# Patient Record
Sex: Female | Born: 1950
Health system: Southern US, Community
[De-identification: ages and names within clinical notes are randomized; demographics above are authoritative.]

## PROBLEM LIST (undated history)

## (undated) DIAGNOSIS — M1990 Unspecified osteoarthritis, unspecified site: Secondary | ICD-10-CM

## (undated) DIAGNOSIS — E215 Disorder of parathyroid gland, unspecified: Secondary | ICD-10-CM

## (undated) DIAGNOSIS — I10 Essential (primary) hypertension: Secondary | ICD-10-CM

## (undated) DIAGNOSIS — E7800 Pure hypercholesterolemia, unspecified: Secondary | ICD-10-CM

## (undated) DIAGNOSIS — S83209A Unspecified tear of unspecified meniscus, current injury, unspecified knee, initial encounter: Secondary | ICD-10-CM

## (undated) DIAGNOSIS — E119 Type 2 diabetes mellitus without complications: Secondary | ICD-10-CM

## (undated) HISTORY — PX: COLONOSCOPY: SHX174

## (undated) HISTORY — PX: CATARACT EXTRACTION: SUR2

---

## 2001-02-09 ENCOUNTER — Encounter: Payer: Self-pay | Admitting: Cardiology

## 2001-02-09 ENCOUNTER — Ambulatory Visit (HOSPITAL_COMMUNITY): Admission: RE | Admit: 2001-02-09 | Discharge: 2001-02-09 | Payer: Self-pay | Admitting: Cardiology

## 2002-01-30 ENCOUNTER — Ambulatory Visit (HOSPITAL_COMMUNITY): Admission: RE | Admit: 2002-01-30 | Discharge: 2002-01-30 | Payer: Self-pay | Admitting: Family Medicine

## 2002-01-30 ENCOUNTER — Encounter: Payer: Self-pay | Admitting: Family Medicine

## 2002-02-06 ENCOUNTER — Ambulatory Visit (HOSPITAL_COMMUNITY): Admission: RE | Admit: 2002-02-06 | Discharge: 2002-02-06 | Payer: Self-pay | Admitting: Family Medicine

## 2002-02-06 ENCOUNTER — Encounter: Payer: Self-pay | Admitting: Family Medicine

## 2003-12-12 ENCOUNTER — Ambulatory Visit (HOSPITAL_COMMUNITY): Admission: RE | Admit: 2003-12-12 | Discharge: 2003-12-12 | Payer: Self-pay | Admitting: Family Medicine

## 2004-12-26 ENCOUNTER — Ambulatory Visit (HOSPITAL_COMMUNITY): Admission: RE | Admit: 2004-12-26 | Discharge: 2004-12-26 | Payer: Self-pay | Admitting: Family Medicine

## 2005-01-19 ENCOUNTER — Ambulatory Visit: Payer: Self-pay | Admitting: Orthopedic Surgery

## 2005-04-22 ENCOUNTER — Ambulatory Visit (HOSPITAL_COMMUNITY): Admission: RE | Admit: 2005-04-22 | Discharge: 2005-04-22 | Payer: Self-pay | Admitting: Family Medicine

## 2005-04-27 ENCOUNTER — Ambulatory Visit: Payer: Self-pay | Admitting: Cardiology

## 2005-04-27 ENCOUNTER — Ambulatory Visit (HOSPITAL_COMMUNITY): Admission: RE | Admit: 2005-04-27 | Discharge: 2005-04-27 | Payer: Self-pay | Admitting: Family Medicine

## 2006-09-07 ENCOUNTER — Ambulatory Visit: Payer: Self-pay | Admitting: Internal Medicine

## 2006-09-24 ENCOUNTER — Ambulatory Visit (HOSPITAL_COMMUNITY): Admission: RE | Admit: 2006-09-24 | Discharge: 2006-09-24 | Payer: Self-pay | Admitting: Internal Medicine

## 2006-09-24 ENCOUNTER — Ambulatory Visit: Payer: Self-pay | Admitting: Internal Medicine

## 2006-12-20 ENCOUNTER — Ambulatory Visit: Payer: Self-pay | Admitting: Internal Medicine

## 2007-10-05 ENCOUNTER — Ambulatory Visit: Payer: Self-pay | Admitting: Internal Medicine

## 2008-04-25 ENCOUNTER — Ambulatory Visit: Payer: Self-pay | Admitting: Gastroenterology

## 2009-06-10 DIAGNOSIS — R7989 Other specified abnormal findings of blood chemistry: Secondary | ICD-10-CM | POA: Insufficient documentation

## 2009-06-10 DIAGNOSIS — Z8719 Personal history of other diseases of the digestive system: Secondary | ICD-10-CM | POA: Insufficient documentation

## 2009-06-10 DIAGNOSIS — K649 Unspecified hemorrhoids: Secondary | ICD-10-CM | POA: Insufficient documentation

## 2009-06-10 DIAGNOSIS — E669 Obesity, unspecified: Secondary | ICD-10-CM | POA: Insufficient documentation

## 2009-06-10 DIAGNOSIS — E119 Type 2 diabetes mellitus without complications: Secondary | ICD-10-CM | POA: Insufficient documentation

## 2009-06-10 DIAGNOSIS — I10 Essential (primary) hypertension: Secondary | ICD-10-CM

## 2009-06-10 DIAGNOSIS — K7689 Other specified diseases of liver: Secondary | ICD-10-CM | POA: Insufficient documentation

## 2009-06-10 DIAGNOSIS — E785 Hyperlipidemia, unspecified: Secondary | ICD-10-CM | POA: Insufficient documentation

## 2010-01-31 ENCOUNTER — Encounter (HOSPITAL_COMMUNITY): Admission: RE | Admit: 2010-01-31 | Discharge: 2010-03-20 | Payer: Self-pay | Admitting: General Surgery

## 2011-03-03 NOTE — Assessment & Plan Note (Signed)
Katelyn [name], Katelyn Katelyn [name]              CHART#:  [id number]   DATE:  10/05/2007                       DOB:  01/02/1951   CHIEF COMPLAINT:  Rectal bleeding.   SUBJECTIVE:  Katelyn Katelyn [name] is a 60 year old female.  She presents after  a six-month history of intermittent rectal bleeding, she has noted a  small to moderate bright red blood with wiping and on the toilet paper.  She has had some occasional cramping and has noticed blood in her  underwear.  She is generally having between two to four bowel movements  a day.  She denies any vaginal bleeding.  She is status post BTL.  She  had a colonoscopy by Katelyn Katelyn [name] on September 23, 2006.  She was found to  have a single prominent anal papilla and otherwise a normal rectum and  colon.  She has a family history of colon cancer, and is due for a  colonoscopy in December 2012.   She also has a history of non-alcoholic steatohepatitis.  She has had  elevated transaminases and an elevated ferritin, which was felt to be an  acute phase reactant, as her levels were not high enough to suggest  hemochromatosis.  She denies any anorexia or pruritus.  Denies any  heartburn or indigestion.  Denies any dysphagia or odynophagia.  Her  weight has remained stable.   CURRENT MEDICATIONS:  See the list from October 05, 2007.   ALLERGIES:  No known drug allergies.   OBJECTIVE:  VITAL SIGNS:  Weight 186 pounds, height 67 inches,  temperature 97.9 degrees, blood pressure 122/90, pulse 92.  GENERAL:  She is a well-developed and well-nourished female, in no acute  distress.  HEENT:  Pupils clear.  Anicteric.  Oropharynx pink and moist without  lesions.  HEART:  A regular rate and rhythm.  Normal S1 and S2.  ABDOMEN:  Protuberant with positive bowel sounds x4.  Nontender, no  palpable mass or hepatosplenomegaly.  No rebound tenderness or guarding.  RECTAL:  She has multiple large external hemorrhoids.  She has good  sphincter tone.  There is no active bleeding  noted.  A small amount of  medium brown stool is obtained from the vault which is Hemoccult  negative.  No internal masses palpated.  EXTREMITIES:  Without clubbing or edema bilaterally.  SKIN:  Warm and dry without any rash or jaundice.   ASSESSMENT/PLAN:  1. Katelyn Katelyn [name] is a 59 year old female with intermittent      hematochezia, which I suspect is due to large external hemorrhoids      found on examination:  It is reassuring that she did have a normal      colonoscopy last year by Katelyn Katelyn [name].  She does have a family history      and is due for a colonoscopy in 2012.  If bleeding persists despite      hemorrhoid treatment, would consider further evaluation.  2. She has non-alcoholic steatohepatitis:  She had recent liver      function tests done through Dr. Loraine Leriche Katelyn [name]'s office.   PLAN:  1. Will follow up on liver function tests.  She needs a fasting      ferritin if this has not already been done.  2. Hemorrhoid literature given for review.  3. AnaMantle HC Forte, one per rectum twice daily, #20,  with no      refills.   FOLLOWUP:  An office visit in six months with Katelyn Katelyn [name], or sooner if  needed.       Katelyn Katelyn [name], N.P.  Electronically Signed     Katelyn Katelyn [name], M.D.  Electronically Signed    KJ/MEDQ  D:  10/05/2007  T:  10/05/2007  Job:  [id number]   cc:   Katelyn Katelyn [name], M.D.

## 2011-03-03 NOTE — Assessment & Plan Note (Signed)
NAME:  Katelyn [name], Katelyn [name]              CHART#:  [id number]   DATE:  04/25/2008                       DOB:  [birth year]   PRIMARY GASTROENTEROLOGIST:  Jonathon Bellows, MD   CHIEF COMPLAINT:  Followup NASH and hemorrhoids.   PROBLEM LIST:  1. Intermittent hematochezia felt to be due to large external      hemorrhoids.  Normal colonoscopy by Dr. Jena Gauss, September 23, 2006      except for slight single prominent anal papilla.  2. Nonalcoholic steatohepatitis.  3. Elevated ferritin felt to be due to acute-phase reactant.  4. Diabetes mellitus.  5. Obesity.  6. Hyperlipidemia.  7. Hypertension and  8. Bilateral tubal ligation.   SUBJECTIVE:  The patient is a 60 year old African American female.  She  has history of NASH.  She tells me she has recently been placed on a  cholesterol medication but cannot recall the name.  She is not actively  attempting to lose weight.  She did exercises very minimally as she  works a significant amount of hours.  Her weight has remained stable.  She denies any abdominal pain.  Denies any anorexia, rectal bleeding, or  melena currently.  Denies any nausea or vomiting.  Denies any fever or  chills.  Denies any history of jaundice.  Denies any fatigue or pruritus  except for she did have a mild reaction to a soap she was using.  She  had a ferritin of 389 on 04/17/2008, and she had normal iron and TIBC.   CURRENT MEDICATIONS:  See the list from 04/25/2008.   ALLERGIES:  No known drug allergies.   OBJECTIVE:  VITAL SIGNS:  Weight 186.5 pounds, height 67 inches,  temperature 98.6, blood pressure 130/90, and pulse 88.  GENERAL:  The patient is an obese [place] American female who is alert,  oriented, pleasant, and cooperative in acute distress.  HEENT:  Sclerae clear.  Nonicteric.  Conjunctivae pink.  Oropharynx pink  and moist without any lesions.  CHEST:  Heart regular rate and rhythm.  Normal S1 and S2.  ABDOMEN:  Protuberant with positive bowel sounds x4.   No bruits  auscultated.  Soft, nontender, nondistended without palpable mass or  hepatosplenomegaly.  No rebound, tenderness, or guarding.  Exam is  limited given the patient's body habitus.  EXTREMITIES:  Without  clubbing or edema.   ASSESSMENT:  The patient is a 60 year old African American female with  nonalcoholic steatohepatitis.   She has history of hemorrhoids.   She has family history of colorectal carcinoma.   PLAN:  1. Colonoscopy in December 2012.  2. Low-fat, low-cholesterol diet.  3. Urged weight loss.  4. I will obtain recent LFTs from Dr. Geanie Logan office for our      review.  5. She was given information on low-fat, low-cholesterol diet as well      as nonalcoholic fatty liver disease.       Lorenza Burton, N.P.  Electronically Signed     Kassie Mends, M.D.  Electronically Signed    KJ/MEDQ  D:  04/25/2008  T:  04/25/2008  Job:  [id number]   cc:   Patrica Duel, M.D.

## 2011-03-06 NOTE — Op Note (Signed)
Katelyn [name], Katelyn [name]             ACCOUNT NO.:  [id number]   MEDICAL RECORD NO.:  [id number]          PATIENT TYPE:  AMB   LOCATION:  DAY                           FACILITY:  [facility]   PHYSICIAN:  R. Roetta Sessions, M.D. DATE OF BIRTH:  1951/02/10   DATE OF PROCEDURE:  09/24/2006  DATE OF DISCHARGE:                               OPERATIVE REPORT   PROCEDURE:  Screening colonoscopy.   INDICATIONS FOR PROCEDURE:  The patient is a 60 year old lady devoid of  any lower GI tract symptoms, who comes for high-risk colorectal cancer  screening.  She is followed primarily Dr. Patrica Duel.   She has a positive family history of colon cancer in both her brother  and father, and she has never been imaged previously.  Colonoscopy is  now being done as a high risk screening maneuver.  This approach has  been discussed with the patient at length.  Potential risks, benefits  and alternatives have been reviewed, questions answered.  She is  agreeable.  Please see documentation in the medical record.   PROCEDURE NOTE:  O2 saturation, blood pressure, pulse and respirations  were monitored throughout the entire procedure.   CONSCIOUS SEDATION:  Versed 3 mg IV, Demerol 75 mg IV in divided doses.   INSTRUMENT USED:  Olympus video chip system.   FINDINGS:  Digital rectal exam revealed no abnormalities.  Endoscopic  findings:  The prep was excellent.   Colon:  Colonic mucosa was surveyed from the rectosigmoid junction  through the left, transverse, right colon, to the area of the appendical  orifice, ileocecal valve and cecum.  These structures were well-seen and  photographed for the record.  From this level the scope was slowly  withdrawn an all previously-mentioned mucosal surfaces were again seen.  The colonic mucosa appeared normal.  The scope was pulled down in the  rectum.  A thorough examination of the rectal mucosa including a  retroflex view of the anal verge revealed a single prominent  anal  papilla.  Otherwise, rectal mucosa appeared normal.  The patient  tolerated the procedure well, was reacted in endoscopy.   IMPRESSION:  1. Single prominent anal papilla, otherwise normal rectum.  2. Normal colon.   RECOMMENDATIONS:  1. Repeat high-risk screening colonoscopy 5 years.  2. Separate issue:  She had she has a fatty-appearing liver on imaging      and transaminitis.  She will be following up with [place] as an      outpatient in the near future.      Jonathon Bellows, M.D.  Electronically Signed     RMR/MEDQ  D:  09/24/2006  T:  09/24/2006  Job:  [id number]   cc:   Patrica Duel, M.D.  Fax: [phone]

## 2011-03-06 NOTE — Procedures (Signed)
Katelyn [name], Katelyn [name]             ACCOUNT NO.:  [id number]   MEDICAL RECORD NO.:  [id number]          PATIENT TYPE:  OUT   LOCATION:  RAD                           FACILITY:  [facility]   PHYSICIAN:  [place] Bing, M.D.  DATE OF BIRTH:  11/03/1950   DATE OF PROCEDURE:  04/27/2005  DATE OF DISCHARGE:                                  ECHOCARDIOGRAM   REFERRING:  Dr. Nobie Putnam   CLINICAL DATA:  A 60 year old woman with hypertension and edema.   M-MODE:  Aorta 2.4, left atrium 3.9, septum 1.5, posterior wall 1, LV  diastole 3.6, LV systole 2.5, RV diastole 3.4.   1.  Technically adequate echocardiographic study.  2.  Normal left atrium, right atrium, and right ventricle.  3.  Normal pulmonic, aortic, mitral, and tricuspid valves.  4.  Normal proximal pulmonary artery.  5.  Normal left ventricular size; borderline hypertrophy with proximal      septal thickening.  Normal regional and global LV systolic function.       RR/MEDQ  D:  04/28/2005  T:  04/28/2005  Job:  [id number]

## 2011-03-06 NOTE — Consult Note (Signed)
NAMEANNIS, LAGOY             ACCOUNT NO.:  [id number]   MEDICAL RECORD NO.:  [id number]          PATIENT TYPE:  AMB   LOCATION:                                FACILITY:  [facility]   PHYSICIAN:  R. Roetta Sessions, M.D. DATE OF BIRTH:  1950/11/05   DATE OF CONSULTATION:  09/07/2006  DATE OF DISCHARGE:                                   CONSULTATION   CHIEF COMPLAINT:  Fatty liver, elevated LFTs.   HISTORY OF PRESENT ILLNESS:  Ms. Mcmanigal is a 60 year old African-American  female who presents for further evaluation of elevated LFTs thought to be  secondary to fatty liver.  She states she first found out that her LFTs were  elevated back in May of this year.  She apparently had a hepatitis panel at  that time which was negative but we do not have these records.  She had  follow-up blood work on August 07, 2006 which revealed a hemoglobin 14,  hematocrit 43, white count 4600, platelets 253,000.  Total bilirubin 0.3,  alkaline phosphatase 74, AST 42, ALT 80, albumin 5, total cholesterol of 269  with LDL of 182, HDL 46, triglycerides 205.  Hemoglobin A1c 7.5.  GGT is  107.  Overall, she feels well.  She feels like her abdomen is bigger than it  used to be but admits to a 10-pound weight gain in the last 6 months due to  decreased activity or exercise.  Generally, she has 3-4 bowel movements a  day since starting Glucophage.  She denies any blood in her stools or  melena.  No nausea, vomiting, heartburn, dysphagia, odynophagia.  She never  had a colonoscopy.   CURRENT MEDICATIONS:  1. Norvasc 10 mg daily.  2. Hydrochlorothiazide 25 mg daily.  3. Glucophage 500 mg b.i.d.  4. Fish oil three tablets daily.   ALLERGIES:  NO KNOWN DRUG ALLERGIES.   PAST MEDICAL HISTORY:  1. Diabetes mellitus, recently started medication.  2. Hypercholesterolemia, recently started fish oil.  3. Hypertension.  4. Bilateral tubal ligation.   FAMILY HISTORY:  Mother died of a stroke at age 59.  Father is  31 years old,  underwent surgery for colon cancer a couple of months ago.   SOCIAL HISTORY:  She is married, has 4 children.  She is employed with [employer] as a Psychologist, sport and exercise.  Never been a smoker.  No alcohol use.   REVIEW OF SYSTEMS:  See HPI for GI.  CARDIOPULMONARY:  No chest pain or shortness of breath.   PHYSICAL EXAMINATION:  Weight 187.5, height 5 feet 7 inches, temperature  98.5, blood pressure is 122/80, pulse 88.  GENERAL:  Pleasant, moderately obese black female in no acute distress.  SKIN:  Warm and dry, no jaundice.  HEENT:  Pupils equal, round, reactive to light.  Sclerae nonicteric.  Oropharyngeal mucosa moist and pink.  No lesions, erythema or exudate.  No  lymphadenopathy, thyromegaly.  CHEST:  Lungs are clear to auscultation.  CARDIAC:  Reveals regular rate and rhythm.  Normal S1 and S2.  No murmurs,  rubs or gallops.  ABDOMEN:  Positive bowel sounds, obese but symmetrical and soft.  Nontender.  Liver edge palpable below the right costal margin, midclavicular line.  Spleen not appreciated.  No masses, hernias or abdominal bruits.  EXTREMITIES:  No edema.   Abdominal ultrasound revealed fatty infiltration of the liver with the liver  measuring 20.3 cm.  Spleen unremarkable.   IMPRESSION:  Ms. Marchetti is a 60 year old lady with hepatomegaly with  fatty infiltration of the liver and mildly elevated transaminases in the  setting of diabetes and hyperlipidemia and obesity.  Suspect we are dealing  with nonalcoholic steatohepatitis.  She reportedly had negative viral  markers previously and we will check those records.  In addition, will check  for hemachromatosis.  She also has a family history of colorectal cancer.  She is 60 years old and has never had a colonoscopy.  She is requesting  scheduling one at this time.  I discussed risks, alternatives and benefits  with regards to risk and reaction of medication, incomplete procedure,  bleeding, infection,  perforation and she is agreeable to proceed.   PLAN:  1. Colonoscopy in the near future.  2. Retrieve viral markers from Dr. Geanie Logan office.  After those are      reviewed, we will order other labs.  3. Encourage dietary modifications, weight loss, exercise.  4. Further recommendations to follow.      Tana Coast, P.AJonathon Bellows, M.D.  Electronically Signed    LL/MEDQ  D:  09/07/2006  T:  09/07/2006  Job:  [id number]   cc:   Patrica Duel, M.D.  Fax: [phone]   Melony Overly, PA

## 2011-07-31 ENCOUNTER — Encounter: Payer: Self-pay | Admitting: Internal Medicine

## 2012-06-17 ENCOUNTER — Other Ambulatory Visit (HOSPITAL_COMMUNITY): Payer: Self-pay | Admitting: Family Medicine

## 2012-06-17 DIAGNOSIS — Z139 Encounter for screening, unspecified: Secondary | ICD-10-CM

## 2012-06-21 ENCOUNTER — Ambulatory Visit (HOSPITAL_COMMUNITY)
Admission: RE | Admit: 2012-06-21 | Discharge: 2012-06-21 | Disposition: A | Discharge status: Home / Self Care | Payer: 59 | Source: Ambulatory Visit | Attending: Family Medicine | Admitting: Family Medicine

## 2012-06-21 DIAGNOSIS — R922 Inconclusive mammogram: Secondary | ICD-10-CM | POA: Insufficient documentation

## 2012-06-21 DIAGNOSIS — Z139 Encounter for screening, unspecified: Secondary | ICD-10-CM

## 2012-06-22 ENCOUNTER — Other Ambulatory Visit: Payer: Self-pay | Admitting: Family Medicine

## 2012-06-22 DIAGNOSIS — R928 Other abnormal and inconclusive findings on diagnostic imaging of breast: Secondary | ICD-10-CM

## 2012-06-29 ENCOUNTER — Ambulatory Visit (HOSPITAL_COMMUNITY)
Admission: RE | Admit: 2012-06-29 | Discharge: 2012-06-29 | Disposition: A | Discharge status: Home / Self Care | Payer: 59 | Source: Ambulatory Visit | Attending: Family Medicine | Admitting: Family Medicine

## 2012-06-29 ENCOUNTER — Other Ambulatory Visit (HOSPITAL_COMMUNITY): Payer: Self-pay | Admitting: Family Medicine

## 2012-06-29 DIAGNOSIS — R928 Other abnormal and inconclusive findings on diagnostic imaging of breast: Secondary | ICD-10-CM

## 2013-01-02 ENCOUNTER — Other Ambulatory Visit (HOSPITAL_COMMUNITY): Payer: Self-pay | Admitting: Family Medicine

## 2013-01-02 ENCOUNTER — Ambulatory Visit (HOSPITAL_COMMUNITY)
Admission: RE | Admit: 2013-01-02 | Discharge: 2013-01-02 | Disposition: A | Discharge status: Home / Self Care | Payer: BC Managed Care – PPO | Source: Ambulatory Visit | Attending: Family Medicine | Admitting: Family Medicine

## 2013-01-02 DIAGNOSIS — M25561 Pain in right knee: Secondary | ICD-10-CM

## 2013-01-02 DIAGNOSIS — M25469 Effusion, unspecified knee: Secondary | ICD-10-CM | POA: Insufficient documentation

## 2013-01-02 DIAGNOSIS — M25569 Pain in unspecified knee: Secondary | ICD-10-CM | POA: Insufficient documentation

## 2013-01-12 ENCOUNTER — Other Ambulatory Visit (HOSPITAL_COMMUNITY): Payer: Self-pay | Admitting: Physician Assistant

## 2013-01-12 DIAGNOSIS — M25561 Pain in right knee: Secondary | ICD-10-CM

## 2013-01-12 DIAGNOSIS — M25469 Effusion, unspecified knee: Secondary | ICD-10-CM

## 2013-01-14 ENCOUNTER — Encounter (HOSPITAL_COMMUNITY): Payer: Self-pay | Admitting: *Deleted

## 2013-01-14 ENCOUNTER — Emergency Department (HOSPITAL_COMMUNITY)
Admission: EM | Admit: 2013-01-14 | Discharge: 2013-01-14 | Disposition: A | Discharge status: Home / Self Care | Payer: BC Managed Care – PPO | Attending: Emergency Medicine | Admitting: Emergency Medicine

## 2013-01-14 DIAGNOSIS — M25561 Pain in right knee: Secondary | ICD-10-CM

## 2013-01-14 DIAGNOSIS — Z79899 Other long term (current) drug therapy: Secondary | ICD-10-CM | POA: Insufficient documentation

## 2013-01-14 DIAGNOSIS — M25569 Pain in unspecified knee: Secondary | ICD-10-CM | POA: Insufficient documentation

## 2013-01-14 DIAGNOSIS — G8911 Acute pain due to trauma: Secondary | ICD-10-CM | POA: Insufficient documentation

## 2013-01-14 DIAGNOSIS — M549 Dorsalgia, unspecified: Secondary | ICD-10-CM | POA: Insufficient documentation

## 2013-01-14 MED ORDER — DEXAMETHASONE 6 MG PO TABS: ORAL_TABLET | ORAL | Status: DC

## 2013-01-14 MED ORDER — DEXAMETHASONE SODIUM PHOSPHATE 4 MG/ML IJ SOLN
8.0000 mg | Freq: Once | INTRAMUSCULAR | Status: AC
  Administered 2013-01-14: 8 mg via INTRAMUSCULAR
  Filled 2013-01-14: qty 2

## 2013-01-14 MED ORDER — HYDROCODONE-ACETAMINOPHEN 7.5-325 MG PO TABS: 1.0000 | ORAL_TABLET | ORAL | Status: DC | PRN

## 2013-01-14 MED ORDER — ONDANSETRON HCL 4 MG PO TABS
4.0000 mg | ORAL_TABLET | Freq: Once | ORAL | Status: AC
  Administered 2013-01-14: 4 mg via ORAL
  Filled 2013-01-14: qty 1

## 2013-01-14 MED ORDER — HYDROCODONE-ACETAMINOPHEN 5-325 MG PO TABS
2.0000 | ORAL_TABLET | Freq: Once | ORAL | Status: AC
  Administered 2013-01-14: 2 via ORAL
  Filled 2013-01-14: qty 2

## 2013-01-14 NOTE — ED Notes (Signed)
Pt states that she hit her right knee on the end of the table two weeks ago, c/o pain and swelling noted to right knee area. Was seen at [facility], had xrays performed, is scheduled for mri on Monday.

## 2013-01-14 NOTE — ED Provider Notes (Signed)
History     CSN: [id number]  Arrival date & time 01/14/13  1225   First MD Initiated Contact with Patient 01/14/13 1317      Chief Complaint  Patient presents with  . Knee Pain    (Consider location/radiation/quality/duration/timing/severity/associated sxs/prior treatment) Patient is a 62 y.o. female presenting with knee pain. The history is provided by the patient.  Knee Pain Location:  Knee Time since incident:  2 weeks Injury: no   Knee location:  R knee Pain details:    Quality:  Aching and throbbing   Severity:  Severe   Onset quality:  Gradual   Timing:  Intermittent   Progression:  Worsening Chronicity:  New Dislocation: no   Prior injury to area:  No Relieved by:  Nothing Worsened by:  Extension, flexion and bearing weight Ineffective treatments:  NSAIDs Associated symptoms: back pain, decreased ROM and swelling   Associated symptoms: no neck pain     History reviewed. No pertinent past medical history.  History reviewed. No pertinent past surgical history.  No family history on file.  History  Substance Use Topics  . Smoking status: Never Smoker   . Smokeless tobacco: Not on file  . Alcohol Use: No    OB History   Grav Para Term Preterm Abortions TAB SAB Ect Mult Living                  Review of Systems  Constitutional: Negative for activity change.       All ROS Neg except as noted in HPI  HENT: Negative for nosebleeds and neck pain.   Eyes: Negative for photophobia and discharge.  Respiratory: Negative for cough, shortness of breath and wheezing.   Cardiovascular: Negative for chest pain and palpitations.  Gastrointestinal: Negative for abdominal pain and blood in stool.  Genitourinary: Negative for dysuria, frequency and hematuria.  Musculoskeletal: Positive for back pain, joint swelling, arthralgias and gait problem.  Skin: Negative.   Neurological: Negative for dizziness, seizures and speech difficulty.  Psychiatric/Behavioral:  Negative for hallucinations and confusion.    Allergies  Review of patient's allergies indicates no known allergies.  Home Medications   Current Outpatient Rx  Name  Route  Sig  Dispense  Refill  . amLODipine (NORVASC) 10 MG tablet   Oral   Take 10 mg by mouth daily.         . Aspirin-Caffeine (BAYER BACK & BODY PAIN EX ST) 500-32.5 MG TABS   Oral   Take 1 tablet by mouth 2 (two) times daily as needed (pain).         . hydrochlorothiazide (HYDRODIURIL) 25 MG tablet   Oral   Take 25 mg by mouth daily.         Marland Kitchen ibuprofen (ADVIL,MOTRIN) 200 MG tablet   Oral   Take 800 mg by mouth every 6 (six) hours as needed for pain.         Marland Kitchen lisinopril (PRINIVIL,ZESTRIL) 20 MG tablet   Oral   Take 20 mg by mouth daily.         . metFORMIN (GLUCOPHAGE) 500 MG tablet   Oral   Take 500 mg by mouth daily.         . OMEGA 3 1000 MG CAPS   Oral   Take 1 capsule by mouth daily.           BP 156/93  Pulse 93  Temp(Src) 98.1 F (36.7 C) (Oral)  Resp 20  Ht 5\' 7"  (  1.702 m)  Wt 186 lb (84.369 kg)  BMI 29.12 kg/m2  SpO2 96%  Physical Exam  Nursing note and vitals reviewed. Constitutional: She is oriented to person, place, and time. She appears well-developed and well-nourished.  Non-toxic appearance.  HENT:  Head: Normocephalic.  Right Ear: Tympanic membrane and external ear normal.  Left Ear: Tympanic membrane and external ear normal.  Eyes: EOM and lids are normal. Pupils are equal, round, and reactive to light.  Neck: Normal range of motion. Neck supple. Carotid bruit is not present.  Cardiovascular: Normal rate, regular rhythm, normal heart sounds, intact distal pulses and normal pulses.   Pulmonary/Chest: Breath sounds normal. No respiratory distress.  Abdominal: Soft. Bowel sounds are normal. There is no tenderness. There is no guarding.  Musculoskeletal: Normal range of motion.  There is soreness with range of motion of the right hip. The right knee has a  mild-to-moderate effusion present. Decreased range of motion. The knee is warm but not hot. The patient cannot cooperate for full examination of the posterior knee. The anterior tibial tuberosity is within normal limits. Dorsalis pedis pulses are 2+ bilaterally.  Lymphadenopathy:       Head (right side): No submandibular adenopathy present.       Head (left side): No submandibular adenopathy present.    She has no cervical adenopathy.  Neurological: She is alert and oriented to person, place, and time. She has normal strength. No cranial nerve deficit or sensory deficit.  Skin: Skin is warm and dry.  Psychiatric: She has a normal mood and affect. Her speech is normal.    ED Course  Procedures (including critical care time)  Labs Reviewed - No data to display No results found.   No diagnosis found.    MDM  I have reviewed nursing notes, vital signs, and all appropriate lab and imaging results for this patient. Patient states she's been having problems with her knees as well as her back. She sustained an injury to the knee approximately 2 to have weeks ago she had an x-ray that was essentially negative. She continued to have problems with the knee and went to see her physician at [facility] and has an MRI ordered for Monday, March 31. The patient's attempted to work today but had to leave because of the pain and discomfort.  The patient is noted to have an effusion with a warm knee present. The vital signs are within normal limits with exception of the blood pressure being 156/93. I suspect the patient is having an inflammatory attack. But feels this is an acute on chronic knee problem. The patient is treated with Decadron and Norco. Patient is to see her primary physician on Monday for recheck.     and  Kathie Dike, PA-C 01/14/13 1341

## 2013-01-15 NOTE — ED Provider Notes (Signed)
Medical screening examination/treatment/procedure(s) were performed by non-physician practitioner and as supervising physician I was immediately available for consultation/collaboration.  Savas Elvin, MD 01/15/13 1507 

## 2013-01-16 ENCOUNTER — Ambulatory Visit (HOSPITAL_COMMUNITY)
Admission: RE | Admit: 2013-01-16 | Discharge: 2013-01-16 | Disposition: A | Discharge status: Home / Self Care | Payer: BC Managed Care – PPO | Source: Ambulatory Visit | Attending: Physician Assistant | Admitting: Physician Assistant

## 2013-01-16 DIAGNOSIS — M25569 Pain in unspecified knee: Secondary | ICD-10-CM | POA: Insufficient documentation

## 2013-01-16 DIAGNOSIS — M898X9 Other specified disorders of bone, unspecified site: Secondary | ICD-10-CM | POA: Insufficient documentation

## 2013-01-16 DIAGNOSIS — M2240 Chondromalacia patellae, unspecified knee: Secondary | ICD-10-CM | POA: Insufficient documentation

## 2013-01-16 DIAGNOSIS — M25561 Pain in right knee: Secondary | ICD-10-CM

## 2013-01-16 DIAGNOSIS — M25469 Effusion, unspecified knee: Secondary | ICD-10-CM | POA: Insufficient documentation

## 2013-01-16 DIAGNOSIS — M23349 Other meniscus derangements, anterior horn of lateral meniscus, unspecified knee: Secondary | ICD-10-CM | POA: Insufficient documentation

## 2013-01-30 ENCOUNTER — Other Ambulatory Visit (HOSPITAL_COMMUNITY): Payer: Self-pay | Admitting: Orthopedic Surgery

## 2013-01-30 ENCOUNTER — Ambulatory Visit (HOSPITAL_COMMUNITY)
Admission: RE | Admit: 2013-01-30 | Discharge: 2013-01-30 | Disposition: A | Discharge status: Home / Self Care | Payer: BC Managed Care – PPO | Source: Ambulatory Visit | Attending: Orthopedic Surgery | Admitting: Orthopedic Surgery

## 2013-01-30 DIAGNOSIS — R0989 Other specified symptoms and signs involving the circulatory and respiratory systems: Secondary | ICD-10-CM

## 2013-01-30 NOTE — Progress Notes (Signed)
VASCULAR LAB PRELIMINARY  ARTERIAL  ABI completed:    RIGHT    LEFT    PRESSURE WAVEFORM  PRESSURE WAVEFORM  BRACHIAL 143 Triphasic BRACHIAL 149 Triphasic  DP 142 Triphasic DP 119 Biphasic  AT   AT    PT 142 Triphasic PT 131 Biphasic  PER   PER    GREAT TOE  NA GREAT TOE  NA    RIGHT LEFT  ABI 0.95 0.88   The right ABI is within normal limits. The left ABI is suggestive of mild arterial insufficiency.  01/30/2013 2:51 PM Gertie Fey, RDMS, RDCS

## 2013-02-08 ENCOUNTER — Other Ambulatory Visit: Payer: Self-pay | Admitting: Orthopedic Surgery

## 2013-02-14 ENCOUNTER — Encounter (HOSPITAL_BASED_OUTPATIENT_CLINIC_OR_DEPARTMENT_OTHER): Payer: Self-pay | Admitting: *Deleted

## 2013-02-14 NOTE — Progress Notes (Signed)
NPO AFTER MN WITH EXCEPTION CLEAR LIQUIDS UNTIL 0800 (NO CREAM/ MILK PRODUCTS). ARRIVES AT 1215. NEEDS ISTAT AND EKG. WILL TAKE NORVASC AM OF SURG W/ SIP OF WATER

## 2013-02-16 ENCOUNTER — Encounter (HOSPITAL_BASED_OUTPATIENT_CLINIC_OR_DEPARTMENT_OTHER): Payer: Self-pay | Admitting: Anesthesiology

## 2013-02-16 ENCOUNTER — Encounter (HOSPITAL_BASED_OUTPATIENT_CLINIC_OR_DEPARTMENT_OTHER): Payer: Self-pay | Admitting: *Deleted

## 2013-02-16 ENCOUNTER — Ambulatory Visit (HOSPITAL_BASED_OUTPATIENT_CLINIC_OR_DEPARTMENT_OTHER)
Admission: RE | Admit: 2013-02-16 | Discharge: 2013-02-16 | Disposition: A | Discharge status: Home / Self Care | Payer: BC Managed Care – PPO | Source: Ambulatory Visit | Attending: Orthopedic Surgery | Admitting: Orthopedic Surgery

## 2013-02-16 ENCOUNTER — Encounter (HOSPITAL_BASED_OUTPATIENT_CLINIC_OR_DEPARTMENT_OTHER)
Admission: RE | Disposition: A | Discharge status: Home / Self Care | Payer: Self-pay | Source: Ambulatory Visit | Attending: Orthopedic Surgery

## 2013-02-16 ENCOUNTER — Ambulatory Visit (HOSPITAL_BASED_OUTPATIENT_CLINIC_OR_DEPARTMENT_OTHER): Payer: BC Managed Care – PPO | Admitting: Anesthesiology

## 2013-02-16 DIAGNOSIS — X58XXXA Exposure to other specified factors, initial encounter: Secondary | ICD-10-CM | POA: Insufficient documentation

## 2013-02-16 DIAGNOSIS — E119 Type 2 diabetes mellitus without complications: Secondary | ICD-10-CM | POA: Insufficient documentation

## 2013-02-16 DIAGNOSIS — Z9889 Other specified postprocedural states: Secondary | ICD-10-CM

## 2013-02-16 DIAGNOSIS — E669 Obesity, unspecified: Secondary | ICD-10-CM | POA: Insufficient documentation

## 2013-02-16 DIAGNOSIS — I10 Essential (primary) hypertension: Secondary | ICD-10-CM | POA: Insufficient documentation

## 2013-02-16 DIAGNOSIS — M6750 Plica syndrome, unspecified knee: Secondary | ICD-10-CM | POA: Insufficient documentation

## 2013-02-16 DIAGNOSIS — Z79899 Other long term (current) drug therapy: Secondary | ICD-10-CM | POA: Insufficient documentation

## 2013-02-16 DIAGNOSIS — M1710 Unilateral primary osteoarthritis, unspecified knee: Secondary | ICD-10-CM | POA: Insufficient documentation

## 2013-02-16 DIAGNOSIS — S83289A Other tear of lateral meniscus, current injury, unspecified knee, initial encounter: Secondary | ICD-10-CM | POA: Insufficient documentation

## 2013-02-16 HISTORY — DX: Essential (primary) hypertension: I10

## 2013-02-16 HISTORY — DX: Type 2 diabetes mellitus without complications: E11.9

## 2013-02-16 HISTORY — DX: Unspecified tear of unspecified meniscus, current injury, unspecified knee, initial encounter: S83.209A

## 2013-02-16 HISTORY — PX: KNEE ARTHROSCOPY WITH LATERAL MENISECTOMY: SHX6193

## 2013-02-16 HISTORY — DX: Unspecified osteoarthritis, unspecified site: M19.90

## 2013-02-16 LAB — POCT I-STAT 4, (NA,K, GLUC, HGB,HCT)
Glucose, Bld: 107 mg/dL — ABNORMAL HIGH (ref 70–99)
Potassium: 4.1 mEq/L (ref 3.5–5.1)
Sodium: 140 mEq/L (ref 135–145)

## 2013-02-16 SURGERY — ARTHROSCOPY, KNEE, WITH LATERAL MENISCECTOMY
Anesthesia: General | Site: Knee | Laterality: Right | Wound class: Clean

## 2013-02-16 MED ORDER — HYDROMORPHONE HCL PF 1 MG/ML IJ SOLN
0.2500 mg | INTRAMUSCULAR | Status: DC | PRN
  Administered 2013-02-16 (×2): 0.25 mg via INTRAVENOUS
  Filled 2013-02-16: qty 1

## 2013-02-16 MED ORDER — DEXAMETHASONE SODIUM PHOSPHATE 4 MG/ML IJ SOLN
INTRAMUSCULAR | Status: DC | PRN
  Administered 2013-02-16: 8 mg via INTRAVENOUS

## 2013-02-16 MED ORDER — SODIUM CHLORIDE 0.9 % IR SOLN
Status: DC | PRN
  Administered 2013-02-16: 6000 mL

## 2013-02-16 MED ORDER — HYDROCODONE-ACETAMINOPHEN 10-325 MG PO TABS: 1.0000 | ORAL_TABLET | Freq: Four times a day (QID) | ORAL | Status: DC | PRN

## 2013-02-16 MED ORDER — ONDANSETRON HCL 4 MG/2ML IJ SOLN
INTRAMUSCULAR | Status: DC | PRN
  Administered 2013-02-16: 4 mg via INTRAVENOUS

## 2013-02-16 MED ORDER — MEPERIDINE HCL 25 MG/ML IJ SOLN
6.2500 mg | INTRAMUSCULAR | Status: DC | PRN
  Filled 2013-02-16: qty 1

## 2013-02-16 MED ORDER — FENTANYL CITRATE 0.05 MG/ML IJ SOLN
INTRAMUSCULAR | Status: DC | PRN
  Administered 2013-02-16 (×3): 50 ug via INTRAVENOUS
  Administered 2013-02-16 (×3): 25 ug via INTRAVENOUS
  Administered 2013-02-16 (×2): 50 ug via INTRAVENOUS
  Administered 2013-02-16: 25 ug via INTRAVENOUS
  Administered 2013-02-16: 50 ug via INTRAVENOUS

## 2013-02-16 MED ORDER — BUPIVACAINE-EPINEPHRINE 0.5% -1:200000 IJ SOLN
INTRAMUSCULAR | Status: DC | PRN
  Administered 2013-02-16: 20 mL

## 2013-02-16 MED ORDER — OXYCODONE HCL 5 MG/5ML PO SOLN
5.0000 mg | Freq: Once | ORAL | Status: DC | PRN
Start: 2013-02-16 — End: 2013-02-17
  Filled 2013-02-16: qty 5

## 2013-02-16 MED ORDER — KETOROLAC TROMETHAMINE 30 MG/ML IJ SOLN
INTRAMUSCULAR | Status: DC | PRN
  Administered 2013-02-16: 30 mg via INTRAVENOUS

## 2013-02-16 MED ORDER — SODIUM CHLORIDE 0.9 % IR SOLN
Status: DC | PRN
  Administered 2013-02-16: 15:00:00

## 2013-02-16 MED ORDER — LIDOCAINE HCL (CARDIAC) 20 MG/ML IV SOLN
INTRAVENOUS | Status: DC | PRN
  Administered 2013-02-16: 60 mg via INTRAVENOUS

## 2013-02-16 MED ORDER — ACETAMINOPHEN 10 MG/ML IV SOLN
1000.0000 mg | Freq: Once | INTRAVENOUS | Status: DC | PRN
  Filled 2013-02-16: qty 100

## 2013-02-16 MED ORDER — LACTATED RINGERS IV SOLN
INTRAVENOUS | Status: DC
  Administered 2013-02-16: 16:00:00 via INTRAVENOUS
  Administered 2013-02-16: 100 mL/h via INTRAVENOUS
  Filled 2013-02-16: qty 1000

## 2013-02-16 MED ORDER — MORPHINE SULFATE 4 MG/ML IJ SOLN
INTRAMUSCULAR | Status: DC | PRN
  Administered 2013-02-16: 4 mg via INTRAVENOUS

## 2013-02-16 MED ORDER — PROPOFOL 10 MG/ML IV BOLUS
INTRAVENOUS | Status: DC | PRN
  Administered 2013-02-16: 150 mg via INTRAVENOUS

## 2013-02-16 MED ORDER — OXYCODONE HCL 5 MG PO TABS
5.0000 mg | ORAL_TABLET | Freq: Once | ORAL | Status: DC | PRN
  Filled 2013-02-16: qty 1

## 2013-02-16 MED ORDER — PROMETHAZINE HCL 25 MG/ML IJ SOLN
6.2500 mg | INTRAMUSCULAR | Status: DC | PRN
  Filled 2013-02-16: qty 1

## 2013-02-16 MED ORDER — ACETAMINOPHEN 10 MG/ML IV SOLN
INTRAVENOUS | Status: DC | PRN
  Administered 2013-02-16: 1000 mg via INTRAVENOUS

## 2013-02-16 MED ORDER — POVIDONE-IODINE 7.5 % EX SOLN
Freq: Once | CUTANEOUS | Status: DC
  Filled 2013-02-16: qty 118

## 2013-02-16 SURGICAL SUPPLY — 49 items
BANDAGE ELASTIC 6 VELCRO ST LF (GAUZE/BANDAGES/DRESSINGS) ×3 IMPLANT
BANDAGE ESMARK 6X9 LF (GAUZE/BANDAGES/DRESSINGS) ×1 IMPLANT
BANDAGE GAUZE ELAST BULKY 4 IN (GAUZE/BANDAGES/DRESSINGS) ×2 IMPLANT
BLADE 4.2CUDA (BLADE) IMPLANT
BLADE CUDA 5.5 (BLADE) IMPLANT
BLADE CUDA SHAVER 3.5 (BLADE) ×2 IMPLANT
BLADE CUTTER GATOR 3.5 (BLADE) ×1 IMPLANT
BLADE GREAT WHITE 4.2 (BLADE) IMPLANT
BNDG CMPR 9X6 STRL LF SNTH (GAUZE/BANDAGES/DRESSINGS) ×1
BNDG ESMARK 6X9 LF (GAUZE/BANDAGES/DRESSINGS) ×2
CANISTER SUCT LVC 12 LTR MEDI- (MISCELLANEOUS) ×4 IMPLANT
CANISTER SUCTION 1200CC (MISCELLANEOUS) ×2 IMPLANT
CLOTH BEACON ORANGE TIMEOUT ST (SAFETY) ×2 IMPLANT
DRAPE ARTHROSCOPY W/POUCH 114 (DRAPES) ×2 IMPLANT
DRAPE LG THREE QUARTER DISP (DRAPES) ×2 IMPLANT
DRSG EMULSION OIL 3X3 NADH (GAUZE/BANDAGES/DRESSINGS) ×2 IMPLANT
DRSG PAD ABDOMINAL 8X10 ST (GAUZE/BANDAGES/DRESSINGS) ×1 IMPLANT
DURAPREP 26ML APPLICATOR (WOUND CARE) ×2 IMPLANT
ELECT MENISCUS 165MM 90D (ELECTRODE) IMPLANT
ELECT REM PT RETURN 9FT ADLT (ELECTROSURGICAL)
ELECTRODE REM PT RTRN 9FT ADLT (ELECTROSURGICAL) IMPLANT
GLOVE BIOGEL PI IND STRL 8 (GLOVE) ×1 IMPLANT
GLOVE BIOGEL PI INDICATOR 8 (GLOVE) ×1
GLOVE ECLIPSE 8.0 STRL XLNG CF (GLOVE) ×4 IMPLANT
GLOVE INDICATOR 8.0 STRL GRN (GLOVE) ×4 IMPLANT
GOWN PREVENTION PLUS LG XLONG (DISPOSABLE) ×3 IMPLANT
GOWN STRL REIN XL XLG (GOWN DISPOSABLE) ×2 IMPLANT
IV NS IRRIG 3000ML ARTHROMATIC (IV SOLUTION) ×4 IMPLANT
KNEE WRAP E Z 3 GEL PACK (MISCELLANEOUS) ×2 IMPLANT
NDL HYPO 18GX1.5 BLUNT FILL (NEEDLE) ×1 IMPLANT
NDL SAFETY ECLIPSE 18X1.5 (NEEDLE) ×1 IMPLANT
NEEDLE HYPO 18GX1.5 BLUNT FILL (NEEDLE) ×2 IMPLANT
NEEDLE HYPO 18GX1.5 SHARP (NEEDLE) ×2
PACK ARTHROSCOPY DSU (CUSTOM PROCEDURE TRAY) ×2 IMPLANT
PACK BASIN DAY SURGERY FS (CUSTOM PROCEDURE TRAY) ×2 IMPLANT
PADDING CAST ABS 4INX4YD NS (CAST SUPPLIES) ×1
PADDING CAST ABS COTTON 4X4 ST (CAST SUPPLIES) ×1 IMPLANT
PENCIL BUTTON HOLSTER BLD 10FT (ELECTRODE) IMPLANT
SET ARTHROSCOPY TUBING (MISCELLANEOUS) ×2
SET ARTHROSCOPY TUBING LN (MISCELLANEOUS) ×1 IMPLANT
SPONGE GAUZE 4X4 12PLY (GAUZE/BANDAGES/DRESSINGS) ×2 IMPLANT
SUT ETHIBOND 2 OS 4 DA (SUTURE) IMPLANT
SUT ETHILON 4 0 PS 2 18 (SUTURE) ×2 IMPLANT
SUT VIC AB 0 CT1 36 (SUTURE) IMPLANT
SUT VIC AB 2-0 PS2 27 (SUTURE) IMPLANT
SYRINGE 10CC LL (SYRINGE) ×2 IMPLANT
TOWEL OR 17X24 6PK STRL BLUE (TOWEL DISPOSABLE) ×2 IMPLANT
WAND 90 DEG TURBOVAC W/CORD (SURGICAL WAND) IMPLANT
WATER STERILE IRR 500ML POUR (IV SOLUTION) ×2 IMPLANT

## 2013-02-16 NOTE — Anesthesia Preprocedure Evaluation (Addendum)
Anesthesia Evaluation  Patient identified by MRN, date of birth, ID band Patient awake    Reviewed: Allergy & Precautions, H&P , NPO status , Patient's Chart, lab work & pertinent test results  Airway Mallampati: II      Dental  (+) Dental Advisory Given, Poor Dentition and Loose,    Pulmonary neg pulmonary ROS,  breath sounds clear to auscultation        Cardiovascular hypertension, Pt. on medications Rhythm:Regular Rate:Normal     Neuro/Psych negative neurological ROS  negative psych ROS   GI/Hepatic negative GI ROS, Neg liver ROS,   Endo/Other  diabetes, Type 2, Oral Hypoglycemic Agents  Renal/GU negative Renal ROS     Musculoskeletal negative musculoskeletal ROS (+)   Abdominal (+) + obese,   Peds  Hematology negative hematology ROS (+)   Anesthesia Other Findings   Reproductive/Obstetrics negative OB ROS                          Anesthesia Physical Anesthesia Plan  ASA: III  Anesthesia Plan: General   Post-op Pain Management:    Induction: Intravenous  Airway Management Planned: LMA  Additional Equipment:   Intra-op Plan:   Post-operative Plan: Extubation in OR  Informed Consent: I have reviewed the patients History and Physical, chart, labs and discussed the procedure including the risks, benefits and alternatives for the proposed anesthesia with the patient or authorized representative who has indicated his/her understanding and acceptance.   Dental advisory given  Plan Discussed with: CRNA  Anesthesia Plan Comments:        Anesthesia Quick Evaluation

## 2013-02-16 NOTE — Transfer of Care (Signed)
Immediate Anesthesia Transfer of Care Note  Patient: Katelyn [name]  Procedure(s) Performed: Procedure(s) (LRB): RIGHT KNEE  ARTHROSCOPY WITH PARTIAL LATERAL MENISECTOMY; Excision of plica with shaving of patella,  partial medial menisectomy (Right)  Patient Location: PACU  Anesthesia Type: General  Level of Consciousness: awake, alert  and oriented  Airway & Oxygen Therapy: Patient Spontanous Breathing and Patient connected to face mask oxygen  Post-op Assessment: Report given to PACU RN and Post -op Vital signs reviewed and stable  Post vital signs: Reviewed and stable  Complications: No apparent anesthesia complications

## 2013-02-16 NOTE — H&P (Signed)
Katelyn [name] is an 62 y.o. female.   Chief Complaint: painful rt knee HPI: MRI demonstrates a torn lateral meniscus  Past Medical History  Diagnosis Date  . Acute meniscal tear of knee     RIGHT  . Hypertension   . Diabetes mellitus, type 2   . Arthritis     BACK    History reviewed. No pertinent past surgical history.  History reviewed. No pertinent family history. Social History:  reports that she has never smoked. She has never used smokeless tobacco. She reports that she does not drink alcohol or use illicit drugs.  Allergies: No Known Allergies  Medications Prior to Admission  Medication Sig Dispense Refill  . amLODipine (NORVASC) 10 MG tablet Take 10 mg by mouth every morning.       . Aspirin-Caffeine (BAYER BACK & BODY PAIN EX ST) 500-32.5 MG TABS Take 1 tablet by mouth 2 (two) times daily as needed (pain).      . hydrochlorothiazide (HYDRODIURIL) 25 MG tablet Take 25 mg by mouth daily.      Marland Kitchen ibuprofen (ADVIL,MOTRIN) 200 MG tablet Take 800 mg by mouth every 6 (six) hours as needed for pain.      Marland Kitchen lisinopril (PRINIVIL,ZESTRIL) 20 MG tablet Take 20 mg by mouth daily.      . metFORMIN (GLUCOPHAGE) 500 MG tablet Take 500 mg by mouth daily.      . OMEGA 3 1000 MG CAPS Take 1 capsule by mouth daily.        Results for orders placed during the hospital encounter of 02/16/13 (from the past 48 hour(s))  POCT I-STAT 4, (NA,K, GLUC, HGB,HCT)     Status: Abnormal   Collection Time    02/16/13 12:52 PM      Result Value Range   Sodium 140  135 - 145 mEq/L   Potassium 4.1  3.5 - 5.1 mEq/L   Glucose, Bld 107 (*) 70 - 99 mg/dL   HCT 16.1  09.6 - 04.5 %   Hemoglobin 14.6  12.0 - 15.0 g/dL   No results found.  ROS  Blood pressure 147/92, pulse 90, temperature 97.1 F (36.2 C), temperature source Oral, resp. rate 18, height 5\' 7"  (1.702 m), weight 85.503 kg (188 lb 8 oz), SpO2 99.00%. Physical Exam  Constitutional: She is oriented to person, place, and time. She appears  well-developed and well-nourished.  HENT:  Head: Normocephalic and atraumatic.  Right Ear: External ear normal.  Left Ear: External ear normal.  Nose: Nose normal.  Mouth/Throat: Oropharynx is clear and moist.  Eyes: Conjunctivae and EOM are normal. Pupils are equal, round, and reactive to light.  Neck: Normal range of motion. Neck supple.  Cardiovascular: Normal rate, regular rhythm, normal heart sounds and intact distal pulses.   Respiratory: Effort normal and breath sounds normal.  GI: Soft. Bowel sounds are normal.  Musculoskeletal: Normal range of motion. She exhibits tenderness.  Tender lateral joint line rt knee  Neurological: She is alert and oriented to person, place, and time. She has normal reflexes.  Skin: Skin is warm and dry.  Psychiatric: She has a normal mood and affect. Her behavior is normal. Judgment and thought content normal.     Assessment/Plan Torn lateral meniscus rt knee Rt knee arthroscopy with partial lateral meniscectomy  APLINGTON,JAMES P 02/16/2013, 2:23 PM

## 2013-02-16 NOTE — Anesthesia Procedure Notes (Signed)
Procedure Name: LMA Insertion Date/Time: 02/16/2013 2:43 PM Performed by: Norva Pavlov Pre-anesthesia Checklist: Patient identified, Emergency Drugs available, Suction available and Patient being monitored Patient Re-evaluated:Patient Re-evaluated prior to inductionOxygen Delivery Method: Circle System Utilized Preoxygenation: Pre-oxygenation with 100% oxygen Intubation Type: IV induction Ventilation: Mask ventilation without difficulty LMA: LMA inserted LMA Size: 4.0 Number of attempts: 1 Airway Equipment and Method: bite block Placement Confirmation: positive ETCO2 Tube secured with: Tape Dental Injury: Teeth and Oropharynx as per pre-operative assessment

## 2013-02-16 NOTE — Anesthesia Postprocedure Evaluation (Signed)
Anesthesia Post Note  Patient: Katelyn [name]  Procedure(s) Performed: Procedure(s) (LRB): RIGHT KNEE  ARTHROSCOPY WITH PARTIAL LATERAL MENISECTOMY; Excision of plica with shaving of patella,  partial medial menisectomy (Right)  Anesthesia type: General  Patient location: PACU  Post pain: Pain level controlled  Post assessment: Post-op Vital signs reviewed  Last Vitals: BP 100/64  Pulse 96  Temp(Src) 37.1 C (Oral)  Resp 15  Ht 5\' 7"  (1.702 m)  Wt 188 lb 8 oz (85.503 kg)  BMI 29.52 kg/m2  SpO2 99%  Post vital signs: Reviewed  Level of consciousness: sedated  Complications: No apparent anesthesia complications

## 2013-02-17 ENCOUNTER — Encounter (HOSPITAL_BASED_OUTPATIENT_CLINIC_OR_DEPARTMENT_OTHER): Payer: Self-pay | Admitting: Orthopedic Surgery

## 2013-02-17 NOTE — Op Note (Signed)
NAME:  Katelyn [name], Katelyn [name]             ACCOUNT NO.:  [id number]  MEDICAL RECORD NO.:  [id number]  LOCATION:  VASC                         FACILITY:  [facility]  PHYSICIAN:  Jatia Musa, M.D.  DATE OF BIRTH:  08/07/1951  DATE OF PROCEDURE:  02/16/2013 DATE OF DISCHARGE:  01/30/2013                              OPERATIVE REPORT   PREOPERATIVE DIAGNOSES: 1. Torn lateral meniscus. 2. Osteoarthritis, right knee.  POSTOPERATIVE DIAGNOSES: 1. Torn medial and lateral menisci. 2. Osteoarthritis. 3. Medial and lateral shelf plica.  OPERATION: 1. Right knee arthroscopy with one partial medial lateral     meniscectomies. 2. Shaving of patella 3. Excision of medial and lateral shelf plica.  SURGEON:  Yerania Chamorro, M.D.  ASSISTANT:  Nurse.  ANESTHESIA:  General.  JUSTIFICATION FOR PROCEDURE:  Painful right knee with an MRI demonstrating the preoperative diagnoses.  Remainder of the pathology will be discussed below.  PROCEDURE IN DETAIL:  Satisfactory general anesthesia.  Ace wrap, and knee support to left lower extremity.  Pneumatic tourniquet applied to right lower extremity with a leg Esmarch done nonsterilely and tourniquet inflated to 300 mmHg.  Thigh stabilizer was applied and right leg was then prepped with DuraPrep from stabilizer to ankle and draped in the sterile field.  Time-out performed.  Superior medial saline inflow.  First, an anterolateral portal, medial compartment joint was evaluated.  She had a good bit of synovitis in the anterior medial joint and after resection of 3.5 shaver, it was demonstrated that the synovium was stuck down on the anterior third of the medial meniscus.  On probing the meniscus, she had a bucket-handle type tear, I corrected this by excising the bucket-handle portion with combination of arthroscopic scissors and 3.5 shaver with the remnant stable on probing.  Looking posteriorly with remainder, the meniscus appeared to be intact.   Looking at the medial gutter and suprapatellar area, she demonstrated plicas in both the medial and lateral suprapatellar areas, which I sectioned with scissors and resected with 3.5 shaver.  She also had osteoarthritis of the patella surface, which I shaved as well.  I then reversed portals. Visualization initially was quite difficult because of a bucket-handle type tear of the lateral meniscus with the medial fragment going up adjacent to the lateral portion of the ACL.  I carefully excised this medial portion with combination of  arthroscopic scissors and baskets taking care to protect the ACL.  This left a remnant of the lateral meniscus which on probing anteriorly was badly torn.  I then began resecting with scissors and the shaver, the torn portion going all the way into the posterior horn.  Final pictures were taken, showing very little rim posteriorly and none to the mid anterior portion.  We then irrigated her knee to clear and closed the 2 entry portals with 4-0 nylon.  I then injected 20 mL of 0.5% Marcaine with adrenaline and 4 mg of morphine through the inflow apparatus, which I closed with 4-0 nylon as well.  Betadine, Adaptic, dry sterile dressing were applied. Tourniquet was released.  She tolerated the procedure well, was taken to recovery room in satisfied condition with no known complications.            ______________________________ Roan Miklos, M.D.     JA/MEDQ  D:  02/16/2013  T:  02/17/2013  Job:  [id number] 

## 2013-02-17 NOTE — Op Note (Deleted)
Katelyn [name], Katelyn [name]             ACCOUNT NO.:  [id number]  MEDICAL RECORD NO.:  [id number]  LOCATION:  VASC                         FACILITY:  [facility]  PHYSICIAN:  Marlowe Kays, M.D.  DATE OF BIRTH:  [birth year]  DATE OF PROCEDURE:  02/16/2013 DATE OF DISCHARGE:  01/30/2013                              OPERATIVE REPORT   PREOPERATIVE DIAGNOSES: 1. Torn lateral meniscus. 2. Osteoarthritis, right knee.  POSTOPERATIVE DIAGNOSES: 1. Torn medial and lateral menisci. 2. Osteoarthritis. 3. Medial and lateral shelf plica.  OPERATION: 1. Right knee arthroscopy with one partial medial lateral     meniscectomies. 2. Shaving of patella 3. Excision of medial and lateral shelf plica.  SURGEON:  Marlowe Kays, M.D.  ASSISTANT:  Nurse.  ANESTHESIA:  General.  JUSTIFICATION FOR PROCEDURE:  Painful right knee with an MRI demonstrating the preoperative diagnoses.  Remainder of the pathology will be discussed below.  PROCEDURE IN DETAIL:  Satisfactory general anesthesia.  Ace wrap, and knee support to left lower extremity.  Pneumatic tourniquet applied to right lower extremity with a leg Esmarch done nonsterilely and tourniquet inflated to 300 mmHg.  Thigh stabilizer was applied and right leg was then prepped with DuraPrep from stabilizer to ankle and draped in the sterile field.  Time-out performed.  Superior medial saline inflow.  First, an anterolateral portal, medial compartment joint was evaluated.  She had a good bit of synovitis in the anterior medial joint and after resection of 3.5 shaver, it was demonstrated that the synovium was stuck down on the anterior third of the medial meniscus.  On probing the meniscus, she had a bucket-handle type tear, I corrected this by excising the bucket-handle portion with combination of arthroscopic scissors and 3.5 shaver with the remnant stable on probing.  Looking posteriorly with remainder, the meniscus appeared to be intact.   Looking at the medial gutter and suprapatellar area, she demonstrated plicas in both the medial and lateral suprapatellar areas, which I sectioned with scissors and resected with 3.5 shaver.  She also had osteoarthritis of the patella surface, which I shaved as well.  I then reversed portals. Visualization initially was quite difficult because of a bucket-handle type tear of the lateral meniscus with the medial fragment going up adjacent to the lateral portion of the ACL.  I carefully excised this medial portion with combination of  arthroscopic scissors and baskets taking care to protect the ACL.  This left a remnant of the lateral meniscus which on probing anteriorly was badly torn.  I then began resecting with scissors and the shaver, the torn portion going all the way into the posterior horn.  Final pictures were taken, showing very little rim posteriorly and none to the mid anterior portion.  We then irrigated her knee to clear and closed the 2 entry portals with 4-0 nylon.  I then injected 20 mL of 0.5% Marcaine with adrenaline and 4 mg of morphine through the inflow apparatus, which I closed with 4-0 nylon as well.  Betadine, Adaptic, dry sterile dressing were applied. Tourniquet was released.  She tolerated the procedure well, was taken to recovery room in satisfied condition with no known complications.  ______________________________ Marlowe Kays, M.D.     JA/MEDQ  D:  02/16/2013  T:  02/17/2013  Job:  [id number]

## 2013-09-12 ENCOUNTER — Other Ambulatory Visit (HOSPITAL_COMMUNITY): Payer: Self-pay | Admitting: Internal Medicine

## 2013-09-12 ENCOUNTER — Other Ambulatory Visit (HOSPITAL_COMMUNITY): Payer: Self-pay | Admitting: Family Medicine

## 2013-09-12 DIAGNOSIS — Z139 Encounter for screening, unspecified: Secondary | ICD-10-CM

## 2013-09-21 ENCOUNTER — Ambulatory Visit (HOSPITAL_COMMUNITY)
Admission: RE | Admit: 2013-09-21 | Discharge: 2013-09-21 | Disposition: A | Discharge status: Home / Self Care | Payer: BC Managed Care – PPO | Source: Ambulatory Visit | Attending: Family Medicine | Admitting: Family Medicine

## 2013-09-21 DIAGNOSIS — Z139 Encounter for screening, unspecified: Secondary | ICD-10-CM

## 2013-09-21 DIAGNOSIS — Z1231 Encounter for screening mammogram for malignant neoplasm of breast: Secondary | ICD-10-CM | POA: Insufficient documentation

## 2013-09-25 ENCOUNTER — Other Ambulatory Visit: Payer: Self-pay | Admitting: Family Medicine

## 2013-09-25 DIAGNOSIS — R928 Other abnormal and inconclusive findings on diagnostic imaging of breast: Secondary | ICD-10-CM

## 2016-01-07 ENCOUNTER — Other Ambulatory Visit: Payer: Self-pay | Admitting: Family Medicine

## 2016-01-07 DIAGNOSIS — N6320 Unspecified lump in the left breast, unspecified quadrant: Secondary | ICD-10-CM

## 2016-01-14 ENCOUNTER — Other Ambulatory Visit (HOSPITAL_COMMUNITY): Payer: Self-pay | Admitting: Family Medicine

## 2016-01-14 ENCOUNTER — Other Ambulatory Visit: Payer: Self-pay

## 2016-01-14 DIAGNOSIS — IMO0002 Reserved for concepts with insufficient information to code with codable children: Secondary | ICD-10-CM

## 2016-01-14 DIAGNOSIS — R229 Localized swelling, mass and lump, unspecified: Principal | ICD-10-CM

## 2016-01-21 ENCOUNTER — Encounter (HOSPITAL_COMMUNITY): Payer: Self-pay

## 2016-01-21 ENCOUNTER — Ambulatory Visit (HOSPITAL_COMMUNITY)
Admission: RE | Admit: 2016-01-21 | Discharge: 2016-01-21 | Disposition: A | Discharge status: Home / Self Care | Payer: BLUE CROSS/BLUE SHIELD | Source: Ambulatory Visit | Attending: Family Medicine | Admitting: Family Medicine

## 2016-01-21 ENCOUNTER — Other Ambulatory Visit (HOSPITAL_COMMUNITY): Payer: Self-pay

## 2016-01-21 DIAGNOSIS — R229 Localized swelling, mass and lump, unspecified: Principal | ICD-10-CM

## 2016-01-21 DIAGNOSIS — IMO0002 Reserved for concepts with insufficient information to code with codable children: Secondary | ICD-10-CM

## 2016-01-21 DIAGNOSIS — R928 Other abnormal and inconclusive findings on diagnostic imaging of breast: Secondary | ICD-10-CM | POA: Insufficient documentation

## 2016-01-22 ENCOUNTER — Encounter: Payer: Self-pay | Admitting: Endocrinology

## 2016-01-22 ENCOUNTER — Ambulatory Visit (INDEPENDENT_AMBULATORY_CARE_PROVIDER_SITE_OTHER): Payer: BLUE CROSS/BLUE SHIELD | Admitting: Endocrinology

## 2016-01-22 VITALS — BP 122/74 | HR 77 | Temp 97.4°F | Resp 14 | Ht 67.0 in | Wt 191.4 lb

## 2016-01-22 DIAGNOSIS — E213 Hyperparathyroidism, unspecified: Secondary | ICD-10-CM | POA: Insufficient documentation

## 2016-01-22 NOTE — Patient Instructions (Addendum)
blood tests are requested for you today.  We'll let you know about the results. Let's check a bone-density test.  you will receive a phone call, about a day and time for an appointment. Please reduce the hydrochlorothiazide pill to 1/2 pill daily.  Please come back for a follow-up appointment in 1 month.

## 2016-01-22 NOTE — Progress Notes (Signed)
Subjective:    Patient ID: Katelyn [name], female    DOB: December 23, 1950, 65 y.o.   MRN: [id number]  HPI Pt was dx'ed with primary hyperparathyroidism in 2011. She saw surg then, but pt says she declined surgery.  she has never had bony fracture.  She has no history of any of the following: osteoporosis, renal dz, thyroid problems, smoking, liver dz, and vit-d deficiency.  She has slight arthralgias throughout the body, and assoc weight gain.   Past Medical History  Diagnosis Date  . Acute meniscal tear of knee     RIGHT  . Hypertension   . Diabetes mellitus, type 2 ([place])   . Arthritis     BACK    Past Surgical History  Procedure Laterality Date  . Knee arthroscopy with lateral menisectomy Right 02/16/2013    Procedure: RIGHT KNEE  ARTHROSCOPY WITH PARTIAL LATERAL MENISECTOMY; Excision of plica with shaving of patella,  partial medial menisectomy;  Surgeon: Magnus Sinning, MD;  Location: [facility];  Service: Orthopedics;  Laterality: Right;    Social History   Social History  . Marital Status: Divorced    Spouse Name: N/A  . Number of Children: N/A  . Years of Education: N/A   Occupational History  . Not on file.   Social History Main Topics  . Smoking status: Never Smoker   . Smokeless tobacco: Never Used  . Alcohol Use: No  . Drug Use: No  . Sexual Activity: Not on file   Other Topics Concern  . Not on file   Social History Narrative    Current Outpatient Prescriptions on File Prior to Visit  Medication Sig Dispense Refill  . amLODipine (NORVASC) 10 MG tablet Take 10 mg by mouth every morning.     . Aspirin-Caffeine (BAYER BACK & BODY PAIN EX ST) 500-32.5 MG TABS Take 1 tablet by mouth 2 (two) times daily as needed (pain).    . hydrochlorothiazide (HYDRODIURIL) 25 MG tablet Take 12.5 mg by mouth daily.     Marland Kitchen HYDROcodone-acetaminophen (NORCO) 10-325 MG per tablet Take 1 tablet by mouth every 6 (six) hours as needed for pain. 30 tablet 0  .  ibuprofen (ADVIL,MOTRIN) 200 MG tablet Take 800 mg by mouth every 6 (six) hours as needed for pain.    Marland Kitchen lisinopril (PRINIVIL,ZESTRIL) 20 MG tablet Take 20 mg by mouth daily.    . metFORMIN (GLUCOPHAGE) 500 MG tablet Take 750 mg by mouth daily.     . OMEGA 3 1000 MG CAPS Take 1 capsule by mouth daily.     No current facility-administered medications on file prior to visit.    No Known Allergies  Family History  Problem Relation Age of Onset  . Hyperparathyroidism Neg Hx     BP 122/74 mmHg  Pulse 77  Temp(Src) 97.4 F (36.3 C) (Oral)  Resp 14  Ht 5\' 7"  (1.702 m)  Wt 191 lb 6.4 oz (86.818 kg)  BMI 29.97 kg/m2  SpO2 97%  Review of Systems denies galactorrhea, hematuria, memory loss, numbness, muscle weakness, abdominal pain, urinary frequency, hypoglycemia, skin rash, visual loss, sob, diarrhea, rhinorrhea, easy bruising.      Objective:   Physical Exam VS: see vs page GEN: no distress HEAD: head: no deformity eyes: no periorbital swelling, no proptosis external nose and ears are normal mouth: no lesion seen NECK: supple, thyroid is not enlarged CHEST WALL: no kyphosis LUNGS: clear to auscultation CV: reg rate and rhythm, no murmur ABD:  abdomen is soft, nontender.  no hepatosplenomegaly.  not distended.  no hernia MUSCULOSKELETAL: muscle bulk and strength are grossly normal.  no obvious joint swelling.  gait is normal and steady EXTEMITIES: no deformity.  no edema PULSES: no carotid bruit NEURO:  cn 2-12 grossly intact.   readily moves all 4's.  sensation is intact to touch on all 4's SKIN:  Normal texture and temperature.  No rash or suspicious lesion is visible.   NODES:  None palpable at the neck PSYCH: alert, well-oriented.  Does not appear anxious nor depressed.   Parathyroid nuc med scan (2011): local activity localizing to the inferior right lobe of the thyroidbed  outside test results are reviewed: PTH=88 Ca++=11.6 25-OH-vit-D=22  I have reviewed outside  records, and summarized: Pt was noted to have elevated PTH and hypercalcemia, and referred here.      Assessment & Plan:  Vit-D deficiency, new.  Hyperparathyroidism: prob contributed to by low vit-D, but may also be primary.  Hypercalcemia: may be related to HCTZ.  we will need to take this situation in stages.   Patient is advised the following: Patient Instructions  blood tests are requested for you today.  We'll let you know about the results. Let's check a bone-density test.  you will receive a phone call, about a day and time for an appointment. Please reduce the hydrochlorothiazide pill to 1/2 pill daily.  Please come back for a follow-up appointment in 1 month.  addendum: i have sent a prescription to your pharmacy, for ergocalciferol.

## 2016-01-23 LAB — VITAMIN D 25 HYDROXY (VIT D DEFICIENCY, FRACTURES): VITD: 21.69 ng/mL — AB (ref 30.00–100.00)

## 2016-01-23 MED ORDER — VITAMIN D (ERGOCALCIFEROL) 1.25 MG (50000 UNIT) PO CAPS: 50000.0000 [IU] | ORAL_CAPSULE | ORAL | Status: DC

## 2016-02-20 ENCOUNTER — Ambulatory Visit (INDEPENDENT_AMBULATORY_CARE_PROVIDER_SITE_OTHER): Payer: BLUE CROSS/BLUE SHIELD | Admitting: Endocrinology

## 2016-02-20 ENCOUNTER — Telehealth: Payer: Self-pay | Admitting: Endocrinology

## 2016-02-20 ENCOUNTER — Encounter: Payer: Self-pay | Admitting: Endocrinology

## 2016-02-20 VITALS — BP 128/82 | HR 75 | Temp 97.5°F | Wt 187.1 lb

## 2016-02-20 DIAGNOSIS — E213 Hyperparathyroidism, unspecified: Secondary | ICD-10-CM

## 2016-02-20 LAB — VITAMIN D 25 HYDROXY (VIT D DEFICIENCY, FRACTURES): VITD: 33.53 ng/mL (ref 30.00–100.00)

## 2016-02-20 NOTE — Progress Notes (Signed)
Subjective:    Patient ID: Katelyn [name], female    DOB: 03-Aug-1951, 65 y.o.   MRN: [id number]  HPI  The state of at least three ongoing medical problems is addressed today, with interval history of each noted here: primary hyperparathyroidism (dx'ed 2011; she saw surg then, but pt says she declined surgery; she has never had bony fracture; she has no history of osteoporosis). She denies falls  Vit-D deficiency: she denies leg cramps    HTN: HCTZ has been reduced: she denies sob Past Medical History  Diagnosis Date  . Acute meniscal tear of knee     RIGHT  . Hypertension   . Diabetes mellitus, type 2 ([place])   . Arthritis     BACK    Past Surgical History  Procedure Laterality Date  . Knee arthroscopy with lateral menisectomy Right 02/16/2013    Procedure: RIGHT KNEE  ARTHROSCOPY WITH PARTIAL LATERAL MENISECTOMY; Excision of plica with shaving of patella,  partial medial menisectomy;  Surgeon: Magnus Sinning, MD;  Location: [facility];  Service: Orthopedics;  Laterality: Right;    Social History   Social History  . Marital Status: Divorced    Spouse Name: N/A  . Number of Children: N/A  . Years of Education: N/A   Occupational History  . Not on file.   Social History Main Topics  . Smoking status: Never Smoker   . Smokeless tobacco: Never Used  . Alcohol Use: No  . Drug Use: No  . Sexual Activity: Not on file   Other Topics Concern  . Not on file   Social History Narrative    Current Outpatient Prescriptions on File Prior to Visit  Medication Sig Dispense Refill  . amLODipine (NORVASC) 10 MG tablet Take 10 mg by mouth every morning.     . Aspirin-Caffeine (BAYER BACK & BODY PAIN EX ST) 500-32.5 MG TABS Take 1 tablet by mouth 2 (two) times daily as needed (pain).    Marland Kitchen atorvastatin (LIPITOR) 20 MG tablet Take 20 mg by mouth daily.    . empagliflozin (JARDIANCE) 25 MG TABS tablet Take 25 mg by mouth daily.    Marland Kitchen HYDROcodone-acetaminophen (NORCO)  10-325 MG per tablet Take 1 tablet by mouth every 6 (six) hours as needed for pain. 30 tablet 0  . ibuprofen (ADVIL,MOTRIN) 200 MG tablet Take 800 mg by mouth every 6 (six) hours as needed for pain.    Marland Kitchen lisinopril (PRINIVIL,ZESTRIL) 20 MG tablet Take 20 mg by mouth daily.    . metFORMIN (GLUCOPHAGE) 500 MG tablet Take 750 mg by mouth daily.     . OMEGA 3 1000 MG CAPS Take 1 capsule by mouth daily.     No current facility-administered medications on file prior to visit.    No Known Allergies  Family History  Problem Relation Age of Onset  . Hyperparathyroidism Neg Hx     BP 128/82 mmHg  Pulse 75  Temp(Src) 97.5 F (36.4 C) (Oral)  Wt 187 lb 2 oz (84.879 kg)  SpO2 98%  Review of Systems Denies edema    Objective:   Physical Exam VITAL SIGNS:  See vs page GENERAL: no distress Chest: no kyphosis Ext: no deformity.  no edema   Lab Results  Component Value Date   PTH 82* 02/20/2016   CALCIUM 11.0* 02/20/2016  25-OH vit-D=34.     Assessment & Plan:  HTN: well-controlled.  She can likely tolerate discontinuation of HCTZ. Vit-D deficiency: better Primary hyperparathyroidism,  unchanged with recent reduction of HCTZ.   Patient is advised the following: Patient Instructions  blood tests are requested for you today.  We'll let you know about the results. Based on the results, we may need to change the HCTZ to a different fluid pill.  Let's check a bone-density test.   Please come back for a follow-up appointment in 1 month.    addendum: d/c HCTZ

## 2016-02-20 NOTE — Patient Instructions (Addendum)
blood tests are requested for you today.  We'll let you know about the results. Based on the results, we may need to change the HCTZ to a different fluid pill.  Let's check a bone-density test.   Please come back for a follow-up appointment in 1 month.

## 2016-02-20 NOTE — Progress Notes (Signed)
Pre visit review using our clinic review tool, if applicable. No additional management support is needed unless otherwise documented below in the visit note. 

## 2016-02-20 NOTE — Telephone Encounter (Signed)
I contacted the pt's daughter and advised we have scheduled the bone density for 03/05/2016 at 930 am at the [facility] on [facility]. Pt's daughter voiced understanding on the appointment time and location.

## 2016-02-20 NOTE — Telephone Encounter (Signed)
PT daughter request that the bone density appt be scheduled with her. [phone]

## 2016-02-21 LAB — PTH, INTACT AND CALCIUM
CALCIUM: 11 mg/dL — AB (ref 8.4–10.5)
PTH: 82 pg/mL — AB (ref 14–64)

## 2016-03-05 ENCOUNTER — Ambulatory Visit (INDEPENDENT_AMBULATORY_CARE_PROVIDER_SITE_OTHER)
Admission: RE | Admit: 2016-03-05 | Discharge: 2016-03-05 | Disposition: A | Discharge status: Home / Self Care | Payer: BLUE CROSS/BLUE SHIELD | Source: Ambulatory Visit | Attending: Endocrinology | Admitting: Endocrinology

## 2016-03-05 DIAGNOSIS — E213 Hyperparathyroidism, unspecified: Secondary | ICD-10-CM

## 2016-03-08 DIAGNOSIS — E213 Hyperparathyroidism, unspecified: Secondary | ICD-10-CM | POA: Diagnosis not present

## 2016-03-26 ENCOUNTER — Encounter: Payer: Self-pay | Admitting: Endocrinology

## 2016-03-26 ENCOUNTER — Ambulatory Visit (INDEPENDENT_AMBULATORY_CARE_PROVIDER_SITE_OTHER): Payer: BLUE CROSS/BLUE SHIELD | Admitting: Endocrinology

## 2016-03-26 VITALS — BP 147/90 | HR 82 | Temp 98.5°F | Resp 16 | Ht 67.0 in | Wt 187.5 lb

## 2016-03-26 DIAGNOSIS — E213 Hyperparathyroidism, unspecified: Secondary | ICD-10-CM | POA: Diagnosis not present

## 2016-03-26 LAB — BASIC METABOLIC PANEL
BUN: 14 mg/dL (ref 6–23)
CHLORIDE: 103 meq/L (ref 96–112)
CO2: 28 mEq/L (ref 19–32)
Calcium: 10.4 mg/dL (ref 8.4–10.5)
Creatinine, Ser: 0.7 mg/dL (ref 0.40–1.20)
GFR: 108.17 mL/min (ref >60.00)
GLUCOSE: 156 mg/dL — AB (ref 70–99)
POTASSIUM: 3.8 meq/L (ref 3.5–5.1)
Sodium: 139 mEq/L (ref 135–145)

## 2016-03-26 NOTE — Patient Instructions (Signed)
blood tests are requested for you today.  We'll let you know about the results.  Depending on the results, we'll either add furosemide (fluid pill) or increase the lisinopril.  Please come back for a follow-up appointment in 6 weeks.

## 2016-03-26 NOTE — Progress Notes (Signed)
Pre visit review using our clinic review tool, if applicable. No additional management support is needed unless otherwise documented below in the visit note. 

## 2016-03-26 NOTE — Progress Notes (Signed)
Subjective:    Patient ID: Katelyn [name], female    DOB: [birth year], 65 y.o.   MRN: [id number]  HPI Primary hyperparathyroidism (dx'ed 2011; she saw surg then, but pt says she declined surgery; she has never had bony fracture; she has no history of osteoporosis). She denies falls  Vit-D deficiency: she denies leg cramps    HTN: HCTZ has been d/c'ed.  Due to the diuretic effect of jardiance: she denies sob Past Medical History  Diagnosis Date  . Acute meniscal tear of knee     RIGHT  . Hypertension   . Diabetes mellitus, type 2 ([place])   . Arthritis     BACK    Past Surgical History  Procedure Laterality Date  . Knee arthroscopy with lateral menisectomy Right 02/16/2013    Procedure: RIGHT KNEE  ARTHROSCOPY WITH PARTIAL LATERAL MENISECTOMY; Excision of plica with shaving of patella,  partial medial menisectomy;  Surgeon: Magnus Sinning, MD;  Location: [facility];  Service: Orthopedics;  Laterality: Right;    Social History   Social History  . Marital Status: Divorced    Spouse Name: N/A  . Number of Children: N/A  . Years of Education: N/A   Occupational History  . Not on file.   Social History Main Topics  . Smoking status: Never Smoker   . Smokeless tobacco: Never Used  . Alcohol Use: No  . Drug Use: No  . Sexual Activity: Not on file   Other Topics Concern  . Not on file   Social History Narrative    Current Outpatient Prescriptions on File Prior to Visit  Medication Sig Dispense Refill  . amLODipine (NORVASC) 10 MG tablet Take 10 mg by mouth every morning.     . Aspirin-Caffeine (BAYER BACK & BODY PAIN EX ST) 500-32.5 MG TABS Take 1 tablet by mouth 2 (two) times daily as needed (pain).    Marland Kitchen atorvastatin (LIPITOR) 20 MG tablet Take 20 mg by mouth daily.    . empagliflozin (JARDIANCE) 25 MG TABS tablet Take 25 mg by mouth daily.    Marland Kitchen HYDROcodone-acetaminophen (NORCO) 10-325 MG per tablet Take 1 tablet by mouth every 6 (six) hours as needed  for pain. 30 tablet 0  . ibuprofen (ADVIL,MOTRIN) 200 MG tablet Take 800 mg by mouth every 6 (six) hours as needed for pain.    Marland Kitchen lisinopril (PRINIVIL,ZESTRIL) 20 MG tablet Take 20 mg by mouth daily.    . metFORMIN (GLUCOPHAGE) 500 MG tablet Take 750 mg by mouth daily.     . OMEGA 3 1000 MG CAPS Take 1 capsule by mouth daily.     No current facility-administered medications on file prior to visit.    No Known Allergies  Family History  Problem Relation Age of Onset  . Hyperparathyroidism Neg Hx     BP 147/90 mmHg  Pulse 82  Temp(Src) 98.5 F (36.9 C) (Oral)  Resp 16  Ht 5\' 7"  (1.702 m)  Wt 187 lb 8 oz (85.049 kg)  BMI 29.36 kg/m2  Review of Systems Denies chest pain and numbness    Objective:   Physical Exam VITAL SIGNS:  See vs page GENERAL: no distress Ext: trace bilat leg edema   Lab Results  Component Value Date   PTH 124* 03/26/2016   CALCIUM 10.1 03/26/2016   CALCIUM 10.4 03/26/2016   Lab Results  Component Value Date   CREATININE 0.70 03/26/2016   BUN 14 03/26/2016   NA 139 03/26/2016  K 3.8 03/26/2016   CL 103 03/26/2016   CO2 28 03/26/2016   DEXA: worst T-score was -0.9.     Assessment & Plan:  HTN: she needs increased rx.  I added lasix.   Primary hyperparathyroidism:  No indication for surgery now.    Patient is advised the following: Patient Instructions  blood tests are requested for you today.  We'll let you know about the results.  Depending on the results, we'll either add furosemide (fluid pill) or increase the lisinopril.  Please come back for a follow-up appointment in 6 weeks.    Renato Shin, MD

## 2016-03-27 LAB — PTH, INTACT AND CALCIUM
Calcium: 10.1 mg/dL (ref 8.4–10.5)
PTH: 124 pg/mL — ABNORMAL HIGH (ref 14–64)

## 2016-03-28 ENCOUNTER — Telehealth: Payer: Self-pay | Admitting: Endocrinology

## 2016-03-28 MED ORDER — FUROSEMIDE 20 MG PO TABS: 20.0000 mg | ORAL_TABLET | Freq: Every day | ORAL | Status: DC

## 2016-03-28 NOTE — Telephone Encounter (Signed)
please call patient: Based on the results, it looks like adding furosemide (fluid pill) is the best option i have sent a prescription to your pharmacy i'll see you next time.

## 2016-03-30 NOTE — Telephone Encounter (Signed)
I contacted the pt and advised of note below and she voiced understanding.  

## 2016-05-07 ENCOUNTER — Ambulatory Visit (INDEPENDENT_AMBULATORY_CARE_PROVIDER_SITE_OTHER): Payer: BLUE CROSS/BLUE SHIELD | Admitting: Endocrinology

## 2016-05-07 ENCOUNTER — Encounter: Payer: Self-pay | Admitting: Endocrinology

## 2016-05-07 VITALS — BP 118/82 | HR 84 | Temp 97.7°F | Wt 187.2 lb

## 2016-05-07 DIAGNOSIS — E213 Hyperparathyroidism, unspecified: Secondary | ICD-10-CM

## 2016-05-07 LAB — BASIC METABOLIC PANEL
BUN: 15 mg/dL (ref 6–23)
CALCIUM: 10.8 mg/dL — AB (ref 8.4–10.5)
CO2: 27 mEq/L (ref 19–32)
CREATININE: 0.68 mg/dL (ref 0.40–1.20)
Chloride: 103 mEq/L (ref 96–112)
GFR: 111.81 mL/min (ref >60.00)
Glucose, Bld: 109 mg/dL — ABNORMAL HIGH (ref 70–99)
Potassium: 3.4 mEq/L — ABNORMAL LOW (ref 3.5–5.1)
SODIUM: 140 meq/L (ref 135–145)

## 2016-05-07 NOTE — Progress Notes (Signed)
Subjective:    Patient ID: Katelyn [name], female    DOB: 1951-09-09, 65 y.o.   MRN: [id number]  HPI Primary hyperparathyroidism (dx'ed 2011; she saw surg then, but pt says she declined surgery; she has never had bony fracture; she has no history of osteoporosis). She denies falls.   Edema: pt says much better recently HTN: she says she gets a diuretic effect from lasix.  she denies sob. Past Medical History  Diagnosis Date  . Acute meniscal tear of knee     RIGHT  . Hypertension   . Diabetes mellitus, type 2 ([place])   . Arthritis     BACK    Past Surgical History  Procedure Laterality Date  . Knee arthroscopy with lateral menisectomy Right 02/16/2013    Procedure: RIGHT KNEE  ARTHROSCOPY WITH PARTIAL LATERAL MENISECTOMY; Excision of plica with shaving of patella,  partial medial menisectomy;  Surgeon: Magnus Sinning, MD;  Location: [facility];  Service: Orthopedics;  Laterality: Right;    Social History   Social History  . Marital Status: Divorced    Spouse Name: N/A  . Number of Children: N/A  . Years of Education: N/A   Occupational History  . Not on file.   Social History Main Topics  . Smoking status: Never Smoker   . Smokeless tobacco: Never Used  . Alcohol Use: No  . Drug Use: No  . Sexual Activity: Not on file   Other Topics Concern  . Not on file   Social History Narrative    Current Outpatient Prescriptions on File Prior to Visit  Medication Sig Dispense Refill  . amLODipine (NORVASC) 10 MG tablet Take 10 mg by mouth every morning.     . Aspirin-Caffeine (BAYER BACK & BODY PAIN EX ST) 500-32.5 MG TABS Take 1 tablet by mouth 2 (two) times daily as needed (pain).    Marland Kitchen atorvastatin (LIPITOR) 20 MG tablet Take 20 mg by mouth daily.    . empagliflozin (JARDIANCE) 25 MG TABS tablet Take 25 mg by mouth daily.    . furosemide (LASIX) 20 MG tablet Take 1 tablet (20 mg total) by mouth daily. 30 tablet 3  . HYDROcodone-acetaminophen (NORCO)  10-325 MG per tablet Take 1 tablet by mouth every 6 (six) hours as needed for pain. 30 tablet 0  . ibuprofen (ADVIL,MOTRIN) 200 MG tablet Take 800 mg by mouth every 6 (six) hours as needed for pain.    Marland Kitchen lisinopril (PRINIVIL,ZESTRIL) 20 MG tablet Take 20 mg by mouth daily.    . OMEGA 3 1000 MG CAPS Take 1 capsule by mouth daily.     No current facility-administered medications on file prior to visit.    No Known Allergies  Family History  Problem Relation Age of Onset  . Hyperparathyroidism Neg Hx     BP 118/82 mmHg  Pulse 84  Temp(Src) 97.7 F (36.5 C) (Oral)  Wt 187 lb 4 oz (84.936 kg)   Review of Systems Denies chest pain and constipation.    Objective:   Physical Exam VITAL SIGNS:  See vs page GENERAL: no distress Ext: trace bilat leg edema   Lab Results  Component Value Date   CREATININE 0.68 05/07/2016   BUN 15 05/07/2016   NA 140 05/07/2016   K 3.4* 05/07/2016   CL 103 05/07/2016   CO2 27 05/07/2016   Lab Results  Component Value Date   PTH 124* 03/26/2016   CALCIUM 10.8* 05/07/2016  Assessment & Plan:  Primary hyperparathyroidism: due for recheck HTN: well-controlled.  Please continue the same medications Edema: improved: Please continue the same diuretics.  Hypokalemia: mild: we'll follow.  Patient is advised the following: Patient Instructions  blood tests are requested for you today.  We'll let you know about the results.  Please continue the same medications.  Please come back for a follow-up appointment in 3-4 months.    Renato Shin, MD

## 2016-05-07 NOTE — Patient Instructions (Addendum)
blood tests are requested for you today.  We'll let you know about the results.  Please continue the same medications.  Please come back for a follow-up appointment in 3-4 months.

## 2016-05-08 LAB — PTH, INTACT AND CALCIUM
CALCIUM: 10.2 mg/dL (ref 8.4–10.5)
PTH: 135 pg/mL — AB (ref 14–64)

## 2016-08-06 ENCOUNTER — Other Ambulatory Visit: Payer: Self-pay | Admitting: Endocrinology

## 2016-09-06 NOTE — Progress Notes (Signed)
Subjective:    Patient ID: Katelyn [name], female    DOB: [birth year], 65 y.o.   MRN: [id number]  HPI Primary hyperparathyroidism (dx'ed 2011; she saw surg then, but pt says she declined surgery; she has never had bony fracture; DEXA was normal in 2017). She denies falls.   Edema: pt says much better recently HTN: she says she gets a diuretic effect from lasix.  she denies sob.  Past Medical History:  Diagnosis Date  . Acute meniscal tear of knee    RIGHT  . Arthritis    BACK  . Diabetes mellitus, type 2 ([place])   . Hypertension     Past Surgical History:  Procedure Laterality Date  . KNEE ARTHROSCOPY WITH LATERAL MENISECTOMY Right 02/16/2013   Procedure: RIGHT KNEE  ARTHROSCOPY WITH PARTIAL LATERAL MENISECTOMY; Excision of plica with shaving of patella,  partial medial menisectomy;  Surgeon: Magnus Sinning, MD;  Location: [facility];  Service: Orthopedics;  Laterality: Right;    Social History   Social History  . Marital status: Divorced    Spouse name: N/A  . Number of children: N/A  . Years of education: N/A   Occupational History  . Not on file.   Social History Main Topics  . Smoking status: Never Smoker  . Smokeless tobacco: Never Used  . Alcohol use No  . Drug use: No  . Sexual activity: Not on file   Other Topics Concern  . Not on file   Social History Narrative  . No narrative on file    Current Outpatient Prescriptions on File Prior to Visit  Medication Sig Dispense Refill  . amLODipine (NORVASC) 10 MG tablet Take 10 mg by mouth every morning.     . Aspirin-Caffeine (BAYER BACK & BODY PAIN EX ST) 500-32.5 MG TABS Take 1 tablet by mouth 2 (two) times daily as needed (pain).    Marland Kitchen atorvastatin (LIPITOR) 20 MG tablet Take 20 mg by mouth daily.    . empagliflozin (JARDIANCE) 25 MG TABS tablet Take 25 mg by mouth daily.    . furosemide (LASIX) 20 MG tablet TAKE ONE TABLET BY MOUTH DAILY 30 tablet 3  . HYDROcodone-acetaminophen (NORCO)  10-325 MG per tablet Take 1 tablet by mouth every 6 (six) hours as needed for pain. 30 tablet 0  . ibuprofen (ADVIL,MOTRIN) 200 MG tablet Take 800 mg by mouth every 6 (six) hours as needed for pain.    Marland Kitchen lisinopril (PRINIVIL,ZESTRIL) 20 MG tablet Take 20 mg by mouth daily.    . metFORMIN (GLUCOPHAGE) 1000 MG tablet Take 2,000 mg by mouth at bedtime.    . OMEGA 3 1000 MG CAPS Take 1 capsule by mouth daily.     No current facility-administered medications on file prior to visit.     No Known Allergies  Family History  Problem Relation Age of Onset  . Hyperparathyroidism Neg Hx     BP 128/70   Pulse 86   Ht 5\' 7"  (1.702 m)   Wt 182 lb (82.6 kg)   SpO2 95%   BMI 28.51 kg/m    Review of Systems Denies numbness and cramps.     Objective:   Physical Exam VITAL SIGNS:  See vs page GENERAL: no distress Ext: trace bilat leg edema.         Assessment & Plan:  Primary hyperparathyroidism: due for recheck.  HTN: well-controlled.  Please continue the same medications.  Edema: improved: Please continue the same diuretics.  Hypokalemia: mild: we'll recheck today.   Patient is advised the following: Patient Instructions  blood tests are requested for you today.  We'll let you know about the results.  Please continue the same medications.    Please come back for a follow-up appointment in 6 months.

## 2016-09-07 ENCOUNTER — Encounter: Payer: Self-pay | Admitting: Endocrinology

## 2016-09-07 ENCOUNTER — Ambulatory Visit (INDEPENDENT_AMBULATORY_CARE_PROVIDER_SITE_OTHER): Payer: BLUE CROSS/BLUE SHIELD | Admitting: Endocrinology

## 2016-09-07 VITALS — BP 128/70 | HR 86 | Ht 67.0 in | Wt 182.0 lb

## 2016-09-07 DIAGNOSIS — E213 Hyperparathyroidism, unspecified: Secondary | ICD-10-CM

## 2016-09-07 LAB — BASIC METABOLIC PANEL
BUN: 18 mg/dL (ref 6–23)
CHLORIDE: 105 meq/L (ref 96–112)
CO2: 27 mEq/L (ref 19–32)
Calcium: 10.6 mg/dL — ABNORMAL HIGH (ref 8.4–10.5)
Creatinine, Ser: 0.78 mg/dL (ref 0.40–1.20)
GFR: 95.33 mL/min (ref >60.00)
Glucose, Bld: 116 mg/dL — ABNORMAL HIGH (ref 70–99)
POTASSIUM: 3.7 meq/L (ref 3.5–5.1)
SODIUM: 140 meq/L (ref 135–145)

## 2016-09-07 LAB — VITAMIN D 25 HYDROXY (VIT D DEFICIENCY, FRACTURES): VITD: 17.56 ng/mL — ABNORMAL LOW (ref 30.00–100.00)

## 2016-09-07 NOTE — Patient Instructions (Addendum)
blood tests are requested for you today.  We'll let you know about the results. Please continue the same medications  Please come back for a follow-up appointment in 6 months.  

## 2016-09-11 LAB — PTH, INTACT AND CALCIUM
CALCIUM: 10.6 mg/dL — AB (ref 8.6–10.4)
PTH: 130 pg/mL — AB (ref 14–64)

## 2016-09-13 ENCOUNTER — Other Ambulatory Visit: Payer: Self-pay | Admitting: Endocrinology

## 2016-09-13 DIAGNOSIS — E213 Hyperparathyroidism, unspecified: Secondary | ICD-10-CM

## 2016-09-13 MED ORDER — VITAMIN D (ERGOCALCIFEROL) 1.25 MG (50000 UNIT) PO CAPS: 50000.0000 [IU] | ORAL_CAPSULE | ORAL | 0 refills | Status: DC

## 2016-12-07 DIAGNOSIS — E119 Type 2 diabetes mellitus without complications: Secondary | ICD-10-CM | POA: Diagnosis not present

## 2016-12-10 DIAGNOSIS — E663 Overweight: Secondary | ICD-10-CM | POA: Diagnosis not present

## 2016-12-10 DIAGNOSIS — E119 Type 2 diabetes mellitus without complications: Secondary | ICD-10-CM | POA: Diagnosis not present

## 2016-12-10 DIAGNOSIS — I10 Essential (primary) hypertension: Secondary | ICD-10-CM | POA: Diagnosis not present

## 2016-12-10 DIAGNOSIS — E1165 Type 2 diabetes mellitus with hyperglycemia: Secondary | ICD-10-CM | POA: Diagnosis not present

## 2016-12-10 DIAGNOSIS — Z1389 Encounter for screening for other disorder: Secondary | ICD-10-CM | POA: Diagnosis not present

## 2016-12-10 DIAGNOSIS — Z6829 Body mass index (BMI) 29.0-29.9, adult: Secondary | ICD-10-CM | POA: Diagnosis not present

## 2016-12-14 ENCOUNTER — Telehealth: Payer: Self-pay | Admitting: Endocrinology

## 2016-12-14 MED ORDER — FUROSEMIDE 20 MG PO TABS: 20.0000 mg | ORAL_TABLET | Freq: Every day | ORAL | 1 refills | Status: DC

## 2016-12-14 NOTE — Telephone Encounter (Signed)
Refill submitted for the furosemide to [facility].

## 2016-12-14 NOTE — Telephone Encounter (Signed)
[id number] pharmacy calling on the status of furosemide (LASIX) 20 MG tablet   please advise  Fax # [phone] Phone # [phone]

## 2017-03-08 ENCOUNTER — Ambulatory Visit (INDEPENDENT_AMBULATORY_CARE_PROVIDER_SITE_OTHER): Payer: Medicare HMO | Admitting: Endocrinology

## 2017-03-08 VITALS — BP 130/88 | HR 91 | Ht 67.0 in | Wt 191.0 lb

## 2017-03-08 DIAGNOSIS — I10 Essential (primary) hypertension: Secondary | ICD-10-CM | POA: Diagnosis not present

## 2017-03-08 DIAGNOSIS — E213 Hyperparathyroidism, unspecified: Secondary | ICD-10-CM | POA: Diagnosis not present

## 2017-03-08 DIAGNOSIS — E559 Vitamin D deficiency, unspecified: Secondary | ICD-10-CM | POA: Insufficient documentation

## 2017-03-08 LAB — BASIC METABOLIC PANEL
BUN: 12 mg/dL (ref 6–23)
CALCIUM: 10.4 mg/dL (ref 8.4–10.5)
CO2: 27 meq/L (ref 19–32)
CREATININE: 0.69 mg/dL (ref 0.40–1.20)
Chloride: 102 mEq/L (ref 96–112)
GFR: 109.65 mL/min (ref >60.00)
Glucose, Bld: 137 mg/dL — ABNORMAL HIGH (ref 70–99)
Potassium: 3.8 mEq/L (ref 3.5–5.1)
Sodium: 139 mEq/L (ref 135–145)

## 2017-03-08 LAB — VITAMIN D 25 HYDROXY (VIT D DEFICIENCY, FRACTURES): VITD: 26.64 ng/mL — ABNORMAL LOW (ref 30.00–100.00)

## 2017-03-08 MED ORDER — VITAMIN D (ERGOCALCIFEROL) 1.25 MG (50000 UNIT) PO CAPS: 50000.0000 [IU] | ORAL_CAPSULE | ORAL | 0 refills | Status: DC

## 2017-03-08 NOTE — Progress Notes (Signed)
Subjective:    Patient ID: Katelyn [name], female    DOB: May 03, 1951, 66 y.o.   MRN: [id number]  HPI Primary hyperparathyroidism (dx'ed 2011; she saw surg then, but pt says she declined surgery; she has never had bony fracture; DEXA was normal in 2017). She denies falls.   Edema: pt says mild now HTN: she says she gets a diuretic effect from lasix.  she denies sob.  Vit-D deficiency: she took short-term high dose rx in late 2017: denies muscle cramps.   Past Medical History:  Diagnosis Date  . Acute meniscal tear of knee    RIGHT  . Arthritis    BACK  . Diabetes mellitus, type 2 ([place])   . Hypertension     Past Surgical History:  Procedure Laterality Date  . KNEE ARTHROSCOPY WITH LATERAL MENISECTOMY Right 02/16/2013   Procedure: RIGHT KNEE  ARTHROSCOPY WITH PARTIAL LATERAL MENISECTOMY; Excision of plica with shaving of patella,  partial medial menisectomy;  Surgeon: Magnus Sinning, MD;  Location: [facility];  Service: Orthopedics;  Laterality: Right;    Social History   Social History  . Marital status: Divorced    Spouse name: N/A  . Number of children: N/A  . Years of education: N/A   Occupational History  . Not on file.   Social History Main Topics  . Smoking status: Never Smoker  . Smokeless tobacco: Never Used  . Alcohol use No  . Drug use: No  . Sexual activity: Not on file   Other Topics Concern  . Not on file   Social History Narrative  . No narrative on file    Current Outpatient Prescriptions on File Prior to Visit  Medication Sig Dispense Refill  . amLODipine (NORVASC) 10 MG tablet Take 10 mg by mouth every morning.     Marland Kitchen atorvastatin (LIPITOR) 20 MG tablet Take 20 mg by mouth daily.    . furosemide (LASIX) 20 MG tablet Take 1 tablet (20 mg total) by mouth daily. 90 tablet 1  . HYDROcodone-acetaminophen (NORCO) 10-325 MG per tablet Take 1 tablet by mouth every 6 (six) hours as needed for pain. 30 tablet 0  . lisinopril  (PRINIVIL,ZESTRIL) 20 MG tablet Take 20 mg by mouth daily.    . metFORMIN (GLUCOPHAGE) 1000 MG tablet Take 2,000 mg by mouth at bedtime.    . OMEGA 3 1000 MG CAPS Take 1 capsule by mouth daily.    . Aspirin-Caffeine (BAYER BACK & BODY PAIN EX ST) 500-32.5 MG TABS Take 1 tablet by mouth 2 (two) times daily as needed (pain).    Marland Kitchen empagliflozin (JARDIANCE) 25 MG TABS tablet Take 25 mg by mouth daily.    Marland Kitchen ibuprofen (ADVIL,MOTRIN) 200 MG tablet Take 800 mg by mouth every 6 (six) hours as needed for pain.     No current facility-administered medications on file prior to visit.     No Known Allergies  Family History  Problem Relation Age of Onset  . Hyperparathyroidism Neg Hx     BP 130/88   Pulse 91   Ht 5\' 7"  (1.702 m)   Wt 191 lb (86.6 kg)   SpO2 96%   BMI 29.91 kg/m    Review of Systems Denies numbness and hematuria.     Objective:   Physical Exam VITAL SIGNS:  See vs page.  GENERAL: no distress.  Ext: trace bilat leg edema.      Assessment & Plan:  Primary hyperparathyroidism: due for recheck.  HTN: well-controlled.  Please continue the same medications.   Edema: improved: Please continue the same diuretics.   Vit-D deficiency: improved, but persists.  I have sent a prescription to your pharmacy

## 2017-03-08 NOTE — Patient Instructions (Signed)
blood tests are requested for you today.  We'll let you know about the results. Please continue the same medications  Please come back for a follow-up appointment in 6 months.  

## 2017-03-09 LAB — PTH, INTACT AND CALCIUM
Calcium: 10.3 mg/dL (ref 8.6–10.4)
PTH: 129 pg/mL — ABNORMAL HIGH (ref 14–64)

## 2017-03-11 DIAGNOSIS — E782 Mixed hyperlipidemia: Secondary | ICD-10-CM | POA: Diagnosis not present

## 2017-03-11 DIAGNOSIS — E1165 Type 2 diabetes mellitus with hyperglycemia: Secondary | ICD-10-CM | POA: Diagnosis not present

## 2017-03-11 DIAGNOSIS — Z6829 Body mass index (BMI) 29.0-29.9, adult: Secondary | ICD-10-CM | POA: Diagnosis not present

## 2017-03-11 DIAGNOSIS — I10 Essential (primary) hypertension: Secondary | ICD-10-CM | POA: Diagnosis not present

## 2017-03-11 DIAGNOSIS — Z1389 Encounter for screening for other disorder: Secondary | ICD-10-CM | POA: Diagnosis not present

## 2017-03-12 ENCOUNTER — Telehealth: Payer: Self-pay | Admitting: Endocrinology

## 2017-03-12 NOTE — Telephone Encounter (Signed)
please call patient: Ins has declined prescription vitamin-D.  Therefore, please take non-prescription vitamin-D, 4000 units per day.

## 2017-03-12 NOTE — Telephone Encounter (Signed)
Gave patient the advice and she stated an understanding

## 2017-03-18 ENCOUNTER — Telehealth: Payer: Self-pay | Admitting: Endocrinology

## 2017-03-18 NOTE — Telephone Encounter (Signed)
Spoke with the patient and she stated an understanding

## 2017-03-18 NOTE — Telephone Encounter (Signed)
please call patient: Insurance declined prescription vitamin-D.  Therefore, please take non-prescription, 3000 units per day.

## 2017-04-01 DIAGNOSIS — Z1231 Encounter for screening mammogram for malignant neoplasm of breast: Secondary | ICD-10-CM | POA: Diagnosis not present

## 2017-05-05 ENCOUNTER — Other Ambulatory Visit: Payer: Self-pay | Admitting: Endocrinology

## 2017-06-17 DIAGNOSIS — Z6830 Body mass index (BMI) 30.0-30.9, adult: Secondary | ICD-10-CM | POA: Diagnosis not present

## 2017-06-17 DIAGNOSIS — E119 Type 2 diabetes mellitus without complications: Secondary | ICD-10-CM | POA: Diagnosis not present

## 2017-06-17 DIAGNOSIS — I10 Essential (primary) hypertension: Secondary | ICD-10-CM | POA: Diagnosis not present

## 2017-06-17 DIAGNOSIS — E6609 Other obesity due to excess calories: Secondary | ICD-10-CM | POA: Diagnosis not present

## 2017-06-17 DIAGNOSIS — E782 Mixed hyperlipidemia: Secondary | ICD-10-CM | POA: Diagnosis not present

## 2017-09-08 ENCOUNTER — Ambulatory Visit: Payer: BLUE CROSS/BLUE SHIELD | Admitting: Endocrinology

## 2017-09-21 ENCOUNTER — Other Ambulatory Visit: Payer: Self-pay | Admitting: Endocrinology

## 2017-09-23 ENCOUNTER — Encounter: Payer: Self-pay | Admitting: Endocrinology

## 2017-09-23 ENCOUNTER — Ambulatory Visit: Payer: Medicare HMO | Admitting: Endocrinology

## 2017-09-23 VITALS — BP 148/94 | HR 79 | Wt 191.6 lb

## 2017-09-23 DIAGNOSIS — E213 Hyperparathyroidism, unspecified: Secondary | ICD-10-CM | POA: Diagnosis not present

## 2017-09-23 LAB — VITAMIN D 25 HYDROXY (VIT D DEFICIENCY, FRACTURES): VITD: 49.95 ng/mL (ref 30.00–100.00)

## 2017-09-23 NOTE — Progress Notes (Signed)
Subjective:    Patient ID: Katelyn [name], female    DOB: 06-12-1951, 66 y.o.   MRN: [id number]  HPI  The state of at least three ongoing medical problems is addressed today, with interval history of each noted here: Primary hyperparathyroidism (dx'ed 2011; she saw surg then, but pt says she declined surgery; she has never had bony fracture; DEXA was normal in 2017). She denies falls.   Edema: she says she gets a good diuretic effect from lasix.  Vit-D deficiency: she took short-term high dose rx in late 2017, but she did not take in 2018, due to cost.  She takes OTC at uncertain dosage: denies muscle cramps.   Past Medical History:  Diagnosis Date  . Acute meniscal tear of knee    RIGHT  . Arthritis    BACK  . Diabetes mellitus, type 2 ([place])   . Hypertension     Past Surgical History:  Procedure Laterality Date  . KNEE ARTHROSCOPY WITH LATERAL MENISECTOMY Right 02/16/2013   Procedure: RIGHT KNEE  ARTHROSCOPY WITH PARTIAL LATERAL MENISECTOMY; Excision of plica with shaving of patella,  partial medial menisectomy;  Surgeon: Magnus Sinning, MD;  Location: [facility];  Service: Orthopedics;  Laterality: Right;    Social History   Socioeconomic History  . Marital status: Divorced    Spouse name: Not on file  . Number of children: Not on file  . Years of education: Not on file  . Highest education level: Not on file  Social Needs  . Financial resource strain: Not on file  . Food insecurity - worry: Not on file  . Food insecurity - inability: Not on file  . Transportation needs - medical: Not on file  . Transportation needs - non-medical: Not on file  Occupational History  . Not on file  Tobacco Use  . Smoking status: Never Smoker  . Smokeless tobacco: Never Used  Substance and Sexual Activity  . Alcohol use: No  . Drug use: No  . Sexual activity: Not on file  Other Topics Concern  . Not on file  Social History Narrative  . Not on file    Current  Outpatient Medications on File Prior to Visit  Medication Sig Dispense Refill  . amLODipine (NORVASC) 10 MG tablet Take 10 mg by mouth every morning.     Marland Kitchen aspirin EC 81 MG tablet Take 81 mg by mouth daily.    . Aspirin-Caffeine (BAYER BACK & BODY PAIN EX ST) 500-32.5 MG TABS Take 1 tablet by mouth 2 (two) times daily as needed (pain).    Marland Kitchen atorvastatin (LIPITOR) 20 MG tablet Take 20 mg by mouth daily.    . empagliflozin (JARDIANCE) 25 MG TABS tablet Take 25 mg by mouth daily.    . furosemide (LASIX) 20 MG tablet TAKE 1 TABLET EVERY DAY 90 tablet 1  . HYDROcodone-acetaminophen (NORCO) 10-325 MG per tablet Take 1 tablet by mouth every 6 (six) hours as needed for pain. 30 tablet 0  . ibuprofen (ADVIL,MOTRIN) 200 MG tablet Take 800 mg by mouth every 6 (six) hours as needed for pain.    Marland Kitchen lisinopril (PRINIVIL,ZESTRIL) 20 MG tablet Take 20 mg by mouth daily.    . metFORMIN (GLUCOPHAGE) 1000 MG tablet Take 2,000 mg by mouth at bedtime.    . OMEGA 3 1000 MG CAPS Take 1 capsule by mouth daily.     No current facility-administered medications on file prior to visit.     No  Known Allergies  Family History  Problem Relation Age of Onset  . Hyperparathyroidism Neg Hx     BP (!) 148/94 (BP Location: Left Arm, Patient Position: Sitting, Cuff Size: Normal)   Pulse 79   Wt 191 lb 9.6 oz (86.9 kg)   SpO2 94%   BMI 30.01 kg/m    Review of Systems Denies numbness and sob    Objective:   Physical Exam VITAL SIGNS:  See vs page GENERAL: no distress Ext: no leg edema      Assessment & Plan:  HTN: persistent Primary hyperparathyroidism: she declines surgery.  Vit-D deficiency: due for recheck.    Patient Instructions  blood tests are requested for you today.  We'll let you know about the results.  Please continue the same medications.    Please see your PCP soon, to follow up your blood pressure.  Please come back for a follow-up appointment in 1 year.

## 2017-09-23 NOTE — Patient Instructions (Addendum)
blood tests are requested for you today.  We'll let you know about the results.  Please continue the same medications.    Please see your PCP soon, to follow up your blood pressure.  Please come back for a follow-up appointment in 1 year.

## 2017-09-28 LAB — PTH, INTACT AND CALCIUM
Calcium: 10.7 mg/dL — ABNORMAL HIGH (ref 8.6–10.4)
PTH: 126 pg/mL — AB (ref 14–64)

## 2017-10-14 DIAGNOSIS — I10 Essential (primary) hypertension: Secondary | ICD-10-CM | POA: Diagnosis not present

## 2017-10-14 DIAGNOSIS — Z0001 Encounter for general adult medical examination with abnormal findings: Secondary | ICD-10-CM | POA: Diagnosis not present

## 2017-10-14 DIAGNOSIS — E1165 Type 2 diabetes mellitus with hyperglycemia: Secondary | ICD-10-CM | POA: Diagnosis not present

## 2017-10-14 DIAGNOSIS — E6609 Other obesity due to excess calories: Secondary | ICD-10-CM | POA: Diagnosis not present

## 2017-10-14 DIAGNOSIS — E782 Mixed hyperlipidemia: Secondary | ICD-10-CM | POA: Diagnosis not present

## 2017-10-14 DIAGNOSIS — E213 Hyperparathyroidism, unspecified: Secondary | ICD-10-CM | POA: Diagnosis not present

## 2017-10-14 DIAGNOSIS — Z6830 Body mass index (BMI) 30.0-30.9, adult: Secondary | ICD-10-CM | POA: Diagnosis not present

## 2017-10-14 DIAGNOSIS — Z1389 Encounter for screening for other disorder: Secondary | ICD-10-CM | POA: Diagnosis not present

## 2017-11-03 ENCOUNTER — Ambulatory Visit (INDEPENDENT_AMBULATORY_CARE_PROVIDER_SITE_OTHER): Payer: Self-pay

## 2017-11-03 DIAGNOSIS — Z1211 Encounter for screening for malignant neoplasm of colon: Secondary | ICD-10-CM

## 2017-11-03 MED ORDER — PEG 3350-KCL-NA BICARB-NACL 420 G PO SOLR: 4000.0000 mL | ORAL | 0 refills | Status: DC

## 2017-11-03 NOTE — Patient Instructions (Signed)
Katelyn [name]   16-Jul-1951 MRN: [id number]    Procedure Date: 12/01/17 Time to register: 9:45 Place to register: [facility] Procedure Time: 10:45 Scheduled provider: [place] WITH TRI-LYTE SPLIT PREP  Please notify us immediately if you are diabetic, take iron supplements, or if you are on Coumadin or any other blood thinners.   Please hold the following medications: will call with this information  You will need to purchase 1 fleet enema and 1 box of Bisacodyl '5mg'$  tablets.   2 DAYS BEFORE PROCEDURE:  DATE: 11/29/17  DAY: Monday Begin clear liquid diet AFTER your lunch meal. NO SOLID FOODS after this point.  1 DAY BEFORE PROCEDURE:  DATE: 11/30/17  DAY: Tuesday Continue clear liquids the entire day - NO SOLID FOOD.   Diabetic medications adjustments for today: will call  At 2:00 pm:  Take 2 Bisacodyl tablets.   At 4:00pm:  Start drinking your solution. Make sure you mix well per instructions on the bottle. Try to drink 1 (one) 8 ounce glass every 10-15 minutes until you have consumed HALF the jug. You should complete by 6:00pm.You must keep the left over solution refrigerated until completed next day.  Continue clear liquids. You must drink plenty of clear liquids to prevent dehyration and kidney failure. Nothing to eat or drink after midnight.  EXCEPTION: If you take medications for your heart, blood pressure or breathing, you may take these medications with a small amount of clear liquid.    DAY OF PROCEDURE:   DATE: 12/01/17  DAY: Wednesday  Diabetic medications adjustments for today: will call  Five hours before your procedure time @ 5:45am:  Finish remaining amout of bowel prep, drinking 1 (one) 8 ounce glass every 10-15 minutes until complete. You have two hours to consume remaining prep.   Three hours before your procedure time '@7'$ :45am:  Nothing by mouth.   At least one hour before going to the hospital:  Give yourself one  Fleet enema. You may take your morning medications with sip of water unless we have instructed otherwise.      Please see below for Dietary Information.  CLEAR LIQUIDS INCLUDE:  Water Jello (NOT red in color)   Ice Popsicles (NOT red in color)   Tea (sugar ok, no milk/cream) Powdered fruit flavored drinks  Coffee (sugar ok, no milk/cream) Gatorade/ Lemonade/ Kool-Aid  (NOT red in color)   Juice: apple, white grape, white cranberry Soft drinks  Clear bullion, consomme, broth (fat free beef/chicken/vegetable)  Carbonated beverages (any kind)  Strained chicken noodle soup Hard Candy   Remember: Clear liquids are liquids that will allow you to see your fingers on the other side of a clear glass. Be sure liquids are NOT red in color, and not cloudy, but CLEAR.  DO NOT EAT OR DRINK ANY OF THE FOLLOWING:  Dairy products of any kind   Cranberry juice Tomato juice / V8 juice   Grapefruit juice Orange juice     Red grape juice  Do not eat any solid foods, including such foods as: cereal, oatmeal, yogurt, fruits, vegetables, creamed soups, eggs, bread, crackers, pureed foods in a blender, etc.   HELPFUL HINTS FOR DRINKING PREP SOLUTION:   Make sure prep is extremely cold. Mix and refrigerate the the morning of the prep. You may also put in the freezer.   You may try mixing some Crystal Light or Country Time Lemonade if you prefer. Mix in small amounts; add more if  necessary.  Try drinking through a straw  Rinse mouth with water or a mouthwash between glasses, to remove after-taste.  Try sipping on a cold beverage /ice/ popsicles between glasses of prep.  Place a piece of sugar-free hard candy in mouth between glasses.  If you become nauseated, try consuming smaller amounts, or stretch out the time between glasses. Stop for 30-60 minutes, then slowly start back drinking.        OTHER INSTRUCTIONS  You will need a responsible adult at least 67 years of age to accompany you and  drive you home. This person must remain in the waiting room during your procedure. The hospital will cancel your procedure if you do not have a responsible adult with you.   1. Wear loose fitting clothing that is easily removed. 2. Leave jewelry and other valuables at home.  3. Remove all body piercing jewelry and leave at home. 4. Total time from sign-in until discharge is approximately 2-3 hours. 5. You should go home directly after your procedure and rest. You can resume normal activities the day after your procedure. 6. The day of your procedure you should not:  Drive  Make legal decisions  Operate machinery  Drink alcohol  Return to work   You may call the office (Dept: [phone]) before 5:00pm, or page the doctor on call [phone]) after 5:00pm, for further instructions, if necessary.   Insurance Information YOU WILL NEED TO CHECK WITH YOUR INSURANCE COMPANY FOR THE BENEFITS OF COVERAGE YOU HAVE FOR THIS PROCEDURE.  UNFORTUNATELY, NOT ALL INSURANCE COMPANIES HAVE BENEFITS TO COVER ALL OR PART OF THESE TYPES OF PROCEDURES.  IT IS YOUR RESPONSIBILITY TO CHECK YOUR BENEFITS, HOWEVER, WE WILL BE GLAD TO ASSIST YOU WITH ANY CODES YOUR INSURANCE COMPANY MAY NEED.    PLEASE NOTE THAT MOST INSURANCE COMPANIES WILL NOT COVER A SCREENING COLONOSCOPY FOR PEOPLE UNDER THE AGE OF 50  IF YOU HAVE BCBS INSURANCE, YOU MAY HAVE BENEFITS FOR A SCREENING COLONOSCOPY BUT IF POLYPS ARE FOUND THE DIAGNOSIS WILL CHANGE AND THEN YOU MAY HAVE A DEDUCTIBLE THAT WILL NEED TO BE MET. SO PLEASE MAKE SURE YOU CHECK YOUR BENEFITS FOR A SCREENING COLONOSCOPY AS WELL AS A DIAGNOSTIC COLONOSCOPY.

## 2017-11-03 NOTE — Progress Notes (Signed)
   Ok to schedule with conscious sedation. Last hydrocodone filled 2014 per [facility] report.  Day before TCS: glipizide 1/2 tablet, metformin 1000mg  at bedtime AM of TCS: hold glipizide and metformin.

## 2017-11-03 NOTE — Progress Notes (Signed)
Gastroenterology Pre-Procedure Review  Request Date:11/03/17 Requesting Physician: Dr.Fusco  PATIENT REVIEW QUESTIONS: The patient responded to the following health history questions as indicated:    1. Diabetes Melitis: yes metformin and gipizide 2. Joint replacements in the past 12 months: no 3. Major health problems in the past 3 months: no 4. Has an artificial valve or MVP: no 5. Has a defibrillator: no 6. Has been advised in past to take antibiotics in advance of a procedure like teeth cleaning: no 7. Family history of colon cancer: yes father and brother- 34's 8. Alcohol Use: no 9. History of sleep apnea: no  10. History of coronary artery or other vascular stents placed within the last 12 months:no 11. History of any prior anesthesia complications: no    MEDICATIONS & ALLERGIES:    Patient reports the following regarding taking any blood thinners:   Plavix? no Aspirin? Yes 81mg  Coumadin? no Brilinta? no Xarelto? no Eliquis? no Pradaxa? no Savaysa? no Effient? no  Patient confirms/reports the following medications:  Current Outpatient Medications  Medication Sig Dispense Refill  . amLODipine (NORVASC) 10 MG tablet Take 10 mg by mouth every morning.     Marland Kitchen aspirin EC 81 MG tablet Take 81 mg by mouth daily.    Marland Kitchen atorvastatin (LIPITOR) 20 MG tablet Take 20 mg by mouth daily.    . furosemide (LASIX) 20 MG tablet TAKE 1 TABLET EVERY DAY 90 tablet 1  . GLIPIZIDE XL 10 MG 24 hr tablet     . HYDROcodone-acetaminophen (NORCO) 10-325 MG per tablet Take 1 tablet by mouth every 6 (six) hours as needed for pain. 30 tablet 0  . lisinopril (PRINIVIL,ZESTRIL) 20 MG tablet Take 20 mg by mouth daily.    . metFORMIN (GLUCOPHAGE) 1000 MG tablet Take 2,000 mg by mouth at bedtime.    . OMEGA 3 1000 MG CAPS Take 1 capsule by mouth daily.    . polyethylene glycol-electrolytes (TRILYTE) 420 g solution Take 4,000 mLs by mouth as directed. 4000 mL 0   No current facility-administered  medications for this visit.     Patient confirms/reports the following allergies:  No Known Allergies  No orders of the defined types were placed in this encounter.   AUTHORIZATION INFORMATION Primary Insurance: Otto Herb #: [id number]   SCHEDULE INFORMATION: Procedure has been scheduled as follows:  Date: 12/01/17 Time: 10:45  Location: [facility] Dr.Rourk  This Gastroenterology Pre-Precedure Review Form is being routed to the following provider(s): Neil Crouch PA-C

## 2017-11-04 NOTE — Progress Notes (Signed)
Pt is aware of dm medication adjustments. Verbally stated she understood.

## 2017-11-08 DIAGNOSIS — R748 Abnormal levels of other serum enzymes: Secondary | ICD-10-CM | POA: Diagnosis not present

## 2017-12-01 ENCOUNTER — Other Ambulatory Visit: Payer: Self-pay

## 2017-12-01 ENCOUNTER — Ambulatory Visit (HOSPITAL_COMMUNITY)
Admission: RE | Admit: 2017-12-01 | Discharge: 2017-12-01 | Disposition: A | Discharge status: Home / Self Care | Payer: Medicare HMO | Source: Ambulatory Visit | Attending: Internal Medicine | Admitting: Internal Medicine

## 2017-12-01 ENCOUNTER — Encounter (HOSPITAL_COMMUNITY)
Admission: RE | Disposition: A | Discharge status: Home / Self Care | Payer: Self-pay | Source: Ambulatory Visit | Attending: Internal Medicine

## 2017-12-01 ENCOUNTER — Encounter (HOSPITAL_COMMUNITY): Payer: Self-pay | Admitting: *Deleted

## 2017-12-01 DIAGNOSIS — K641 Second degree hemorrhoids: Secondary | ICD-10-CM | POA: Insufficient documentation

## 2017-12-01 DIAGNOSIS — I10 Essential (primary) hypertension: Secondary | ICD-10-CM | POA: Insufficient documentation

## 2017-12-01 DIAGNOSIS — Z1211 Encounter for screening for malignant neoplasm of colon: Secondary | ICD-10-CM | POA: Diagnosis not present

## 2017-12-01 DIAGNOSIS — Z800 Family history of malignant neoplasm of digestive organs: Secondary | ICD-10-CM | POA: Insufficient documentation

## 2017-12-01 DIAGNOSIS — Z7982 Long term (current) use of aspirin: Secondary | ICD-10-CM | POA: Diagnosis not present

## 2017-12-01 DIAGNOSIS — Z79899 Other long term (current) drug therapy: Secondary | ICD-10-CM | POA: Insufficient documentation

## 2017-12-01 DIAGNOSIS — E7800 Pure hypercholesterolemia, unspecified: Secondary | ICD-10-CM | POA: Insufficient documentation

## 2017-12-01 DIAGNOSIS — D123 Benign neoplasm of transverse colon: Secondary | ICD-10-CM | POA: Insufficient documentation

## 2017-12-01 DIAGNOSIS — E119 Type 2 diabetes mellitus without complications: Secondary | ICD-10-CM | POA: Insufficient documentation

## 2017-12-01 DIAGNOSIS — Z7984 Long term (current) use of oral hypoglycemic drugs: Secondary | ICD-10-CM | POA: Diagnosis not present

## 2017-12-01 DIAGNOSIS — M479 Spondylosis, unspecified: Secondary | ICD-10-CM | POA: Insufficient documentation

## 2017-12-01 HISTORY — PX: POLYPECTOMY: SHX5525

## 2017-12-01 HISTORY — DX: Pure hypercholesterolemia, unspecified: E78.00

## 2017-12-01 HISTORY — PX: COLONOSCOPY: SHX5424

## 2017-12-01 LAB — GLUCOSE, CAPILLARY: GLUCOSE-CAPILLARY: 88 mg/dL (ref 65–99)

## 2017-12-01 SURGERY — COLONOSCOPY
Anesthesia: Moderate Sedation

## 2017-12-01 MED ORDER — MIDAZOLAM HCL 5 MG/5ML IJ SOLN
INTRAMUSCULAR | Status: AC
  Filled 2017-12-01: qty 10

## 2017-12-01 MED ORDER — ONDANSETRON HCL 4 MG/2ML IJ SOLN
INTRAMUSCULAR | Status: DC | PRN
  Administered 2017-12-01: 4 mg via INTRAVENOUS

## 2017-12-01 MED ORDER — SODIUM CHLORIDE 0.9 % IV SOLN
INTRAVENOUS | Status: DC
  Administered 2017-12-01: 10:00:00 via INTRAVENOUS

## 2017-12-01 MED ORDER — MIDAZOLAM HCL 5 MG/5ML IJ SOLN
INTRAMUSCULAR | Status: DC | PRN
  Administered 2017-12-01: 2 mg via INTRAVENOUS
  Administered 2017-12-01 (×2): 1 mg via INTRAVENOUS

## 2017-12-01 MED ORDER — MEPERIDINE HCL 100 MG/ML IJ SOLN
INTRAMUSCULAR | Status: AC
  Filled 2017-12-01: qty 2

## 2017-12-01 MED ORDER — MEPERIDINE HCL 100 MG/ML IJ SOLN
INTRAMUSCULAR | Status: DC | PRN
  Administered 2017-12-01: 50 mg via INTRAVENOUS
  Administered 2017-12-01 (×2): 25 mg via INTRAVENOUS

## 2017-12-01 MED ORDER — ONDANSETRON HCL 4 MG/2ML IJ SOLN
INTRAMUSCULAR | Status: AC
  Filled 2017-12-01: qty 2

## 2017-12-01 NOTE — Op Note (Signed)
[facility] Patient Name: Katelyn [name] Procedure Date: 12/01/2017 9:47 AM MRN: [id number] Date of Birth: 12-Nov-1950 Attending MD: Norvel Richards , MD CSN: [id number] Age: 67 Admit Type: Outpatient Procedure:                Colonoscopy Indications:              Screening in patient at increased risk: Family                            history of 1st-degree relative with colorectal                            cancer Providers:                Norvel Richards, MD, Lurline Del, RN, Aram Candela Referring MD:              Medicines:                Midazolam 4 mg IV, Meperidine 75 mg IV Complications:            No immediate complications. Estimated Blood Loss:     Estimated blood loss was minimal. Procedure:                Pre-Anesthesia Assessment:                           - Prior to the procedure, a History and Physical                            was performed, and patient medications and                            allergies were reviewed. The patient's tolerance of                            previous anesthesia was also reviewed. The risks                            and benefits of the procedure and the sedation                            options and risks were discussed with the patient.                            All questions were answered, and informed consent                            was obtained. Prior Anticoagulants: The patient has                            taken no previous anticoagulant or antiplatelet  agents. ASA Grade Assessment: II - A patient with                            mild systemic disease. After reviewing the risks                            and benefits, the patient was deemed in                            satisfactory condition to undergo the procedure.                           After obtaining informed consent, the colonoscope                            was passed under direct vision.  Throughout the                            procedure, the patient's blood pressure, pulse, and                            oxygen saturations were monitored continuously. The                            EC-[id number] ([id number]) scope was introduced through                            the anus and advanced to the the cecum, identified                            by appendiceal orifice and ileocecal valve. The                            ileocecal valve, appendiceal orifice, and rectum                            were photographed. The quality of the bowel                            preparation was adequate. Scope In: 10:08:06 AM Scope Out: 10:29:32 AM Scope Withdrawal Time: 0 hours 8 minutes 34 seconds  Total Procedure Duration: 0 hours 21 minutes 26 seconds  Findings:      The perianal and digital rectal examinations were normal.      A 4 mm polyp was found in the hepatic flexure. The polyp was sessile.       The polyp was removed with a cold snare. resected/ablated; no specimens       were collected for this exam.      Non-bleeding internal hemorrhoids were found during retroflexion. The       hemorrhoids were moderate, medium-sized and Grade II (internal       hemorrhoids that prolapse but reduce spontaneously). Impression:               - One 4 mm polyp at the hepatic flexure, removed  with a cold snare. Resected /ablated. No specimens                            collected.                           - Non-bleeding internal hemorrhoids. Moderate Sedation:      Moderate (conscious) sedation was administered by the endoscopy nurse       and supervised by the endoscopist. The following parameters were       monitored: oxygen saturation, heart rate, blood pressure, respiratory       rate, EKG, adequacy of pulmonary ventilation, and response to care.       Total physician intraservice time was 25 minutes. Recommendation:           - Patient has a contact number available for                             emergencies. The signs and symptoms of potential                            delayed complications were discussed with the                            patient. Return to normal activities tomorrow.                            Written discharge instructions were provided to the                            patient.                           - Advance diet as tolerated.                           - Continue present medications.                           - Await pathology results.                           - Repeat colonoscopy in 5 years for screening                            purposes.                           - Return to GI clinic (date not yet determined). Procedure Code(s):        --- Professional ---                           [phone], Colonoscopy, flexible; with removal of                            tumor(s), polyp(s), or other lesion(s) by snare  technique                           [phone], Moderate sedation services provided by the                            same physician or other qualified health care                            professional performing the diagnostic or                            therapeutic service that the sedation supports,                            requiring the presence of an independent trained                            observer to assist in the monitoring of the                            patient's level of consciousness and physiological                            status; initial 15 minutes of intraservice time,                            patient age 77 years or older                           [phone], Moderate sedation services; each additional                            15 minutes intraservice time Diagnosis Code(s):        --- Professional ---                           Z80.0, Family history of malignant neoplasm of                            digestive organs                           D12.3, Benign neoplasm of  transverse colon (hepatic                            flexure or splenic flexure)                           K64.1, Second degree hemorrhoids CPT copyright 2016 American Medical Association. All rights reserved. The codes documented in this report are preliminary and upon coder review may  be revised to meet current compliance requirements. Cristopher Estimable. Ansel Ferrall, MD Norvel Richards, MD 12/01/2017 10:42:36 AM This report has been signed electronically. Number of Addenda: 0

## 2017-12-01 NOTE — H&P (Signed)
@LOGO @   Primary Care Physician:  Jake Samples, PA-C Primary Gastroenterologist:  Dr. Gala Romney  Pre-Procedure History & Physical: HPI:  Katelyn [name] is a 67 y.o. female is here for a screening colonoscopy. Positive family history of colon cancer in brother and father. Previously negative colonoscopy but records unavailable for review.  Past Medical History:  Diagnosis Date  . Acute meniscal tear of knee    RIGHT  . Arthritis    BACK  . Diabetes mellitus, type 2 ([place])   . Hypercholesteremia   . Hypertension     Past Surgical History:  Procedure Laterality Date  . COLONOSCOPY    . KNEE ARTHROSCOPY WITH LATERAL MENISECTOMY Right 02/16/2013   Procedure: RIGHT KNEE  ARTHROSCOPY WITH PARTIAL LATERAL MENISECTOMY; Excision of plica with shaving of patella,  partial medial menisectomy;  Surgeon: Magnus Sinning, MD;  Location: [facility];  Service: Orthopedics;  Laterality: Right;    Prior to Admission medications   Medication Sig Start Date End Date Taking? Authorizing Provider  amLODipine (NORVASC) 10 MG tablet Take 10 mg by mouth every morning.    Yes [provider]  aspirin EC 81 MG tablet Take 81 mg by mouth daily.   Yes [provider]  atorvastatin (LIPITOR) 20 MG tablet Take 20 mg by mouth daily.   Yes [provider]  cholecalciferol (VITAMIN D) 1000 units tablet Take 1,000 Units by mouth daily.   Yes [provider]  furosemide (LASIX) 20 MG tablet TAKE 1 TABLET EVERY DAY 09/21/17  Yes Renato Shin, MD  GLIPIZIDE XL 10 MG 24 hr tablet  09/28/17  Yes [provider]  HYDROcodone-acetaminophen (NORCO) 10-325 MG per tablet Take 1 tablet by mouth every 6 (six) hours as needed for pain. 02/16/13  Yes A[place]ton, Laurice Record, MD  lisinopril (PRINIVIL,ZESTRIL) 20 MG tablet Take 20 mg by mouth daily.   Yes [provider]  metFORMIN (GLUCOPHAGE) 1000 MG tablet Take 2,000 mg by mouth at bedtime.   Yes [provider]  OMEGA 3 1000 MG CAPS Take 1 capsule by mouth daily.   Yes [provider]    Allergies as of 11/03/2017  . (No Known Allergies)    Family History  Problem Relation Age of Onset  . Colon cancer Father   . Colon cancer Brother   . Hyperparathyroidism Neg Hx     Social History   Socioeconomic History  . Marital status: Divorced    Spouse name: Not on file  . Number of children: Not on file  . Years of education: Not on file  . Highest education level: Not on file  Social Needs  . Financial resource strain: Not on file  . Food insecurity - worry: Not on file  . Food insecurity - inability: Not on file  . Transportation needs - medical: Not on file  . Transportation needs - non-medical: Not on file  Occupational History  . Not on file  Tobacco Use  . Smoking status: Never Smoker  . Smokeless tobacco: Never Used  Substance and Sexual Activity  . Alcohol use: No  . Drug use: No  . Sexual activity: Not on file  Other Topics Concern  . Not on file  Social History Narrative  . Not on file    Review of Systems: See HPI, otherwise negative ROS  Physical Exam: BP (!) 164/94   Pulse 70   Temp 97.8 F (36.6 C) (Oral)   Resp 20  Ht 5\' 7"  (1.702 m)   Wt 191 lb (86.6 kg)   SpO2 97%   BMI 29.91 kg/m  General:   Alert,  Well-developed, well-nourished, pleasant and cooperative in NAD Lungs:  Clear throughout to auscultation.   No wheezes, crackles, or rhonchi. No acute distress. Heart:  Regular rate and rhythm; no murmurs, clicks, rubs,  or gallops. Abdomen:  Soft, nontender and nondistended. No masses, hepatosplenomegaly or hernias noted. Normal bowel sounds, without guarding, and without rebound.     Impression:  Katelyn [name] is now here to undergo a screening colonoscopy.  High-risk screening examination.  Risks, benefits, limitations, imponderables and alternatives regarding colonoscopy have been reviewed with the patient. Questions  have been answered. All parties agreeable.     Notice:  This dictation was prepared with Dragon dictation along with smaller phrase technology. Any transcriptional errors that result from this process are unintentional and may not be corrected upon review.

## 2017-12-01 NOTE — Discharge Instructions (Signed)

## 2017-12-03 ENCOUNTER — Encounter (HOSPITAL_COMMUNITY): Payer: Self-pay | Admitting: Internal Medicine

## 2017-12-06 DIAGNOSIS — Z0120 Encounter for dental examination and cleaning without abnormal findings: Secondary | ICD-10-CM | POA: Insufficient documentation

## 2018-04-04 DIAGNOSIS — Z1231 Encounter for screening mammogram for malignant neoplasm of breast: Secondary | ICD-10-CM | POA: Diagnosis not present

## 2018-05-20 DIAGNOSIS — Z6829 Body mass index (BMI) 29.0-29.9, adult: Secondary | ICD-10-CM | POA: Diagnosis not present

## 2018-05-20 DIAGNOSIS — Z1389 Encounter for screening for other disorder: Secondary | ICD-10-CM | POA: Diagnosis not present

## 2018-05-20 DIAGNOSIS — Z13220 Encounter for screening for lipoid disorders: Secondary | ICD-10-CM | POA: Diagnosis not present

## 2018-05-20 DIAGNOSIS — E118 Type 2 diabetes mellitus with unspecified complications: Secondary | ICD-10-CM | POA: Diagnosis not present

## 2018-05-20 DIAGNOSIS — I10 Essential (primary) hypertension: Secondary | ICD-10-CM | POA: Diagnosis not present

## 2018-05-20 DIAGNOSIS — E7849 Other hyperlipidemia: Secondary | ICD-10-CM | POA: Diagnosis not present

## 2018-05-20 DIAGNOSIS — E663 Overweight: Secondary | ICD-10-CM | POA: Diagnosis not present

## 2018-05-23 DIAGNOSIS — Z1389 Encounter for screening for other disorder: Secondary | ICD-10-CM | POA: Diagnosis not present

## 2018-05-23 DIAGNOSIS — Z6829 Body mass index (BMI) 29.0-29.9, adult: Secondary | ICD-10-CM | POA: Diagnosis not present

## 2018-05-23 DIAGNOSIS — E663 Overweight: Secondary | ICD-10-CM | POA: Diagnosis not present

## 2018-05-23 DIAGNOSIS — E782 Mixed hyperlipidemia: Secondary | ICD-10-CM | POA: Diagnosis not present

## 2018-06-17 ENCOUNTER — Other Ambulatory Visit: Payer: Self-pay | Admitting: Endocrinology

## 2018-07-04 DIAGNOSIS — R748 Abnormal levels of other serum enzymes: Secondary | ICD-10-CM | POA: Diagnosis not present

## 2018-08-25 DIAGNOSIS — Z1389 Encounter for screening for other disorder: Secondary | ICD-10-CM | POA: Diagnosis not present

## 2018-08-25 DIAGNOSIS — E119 Type 2 diabetes mellitus without complications: Secondary | ICD-10-CM | POA: Diagnosis not present

## 2018-08-25 DIAGNOSIS — Z6829 Body mass index (BMI) 29.0-29.9, adult: Secondary | ICD-10-CM | POA: Diagnosis not present

## 2018-08-25 DIAGNOSIS — I10 Essential (primary) hypertension: Secondary | ICD-10-CM | POA: Diagnosis not present

## 2018-08-25 DIAGNOSIS — Z0001 Encounter for general adult medical examination with abnormal findings: Secondary | ICD-10-CM | POA: Diagnosis not present

## 2018-08-25 DIAGNOSIS — E782 Mixed hyperlipidemia: Secondary | ICD-10-CM | POA: Diagnosis not present

## 2018-09-23 ENCOUNTER — Ambulatory Visit: Payer: Medicare HMO | Admitting: Endocrinology

## 2018-09-23 ENCOUNTER — Encounter: Payer: Self-pay | Admitting: Endocrinology

## 2018-09-23 VITALS — BP 142/80 | HR 74 | Ht 67.0 in | Wt 191.2 lb

## 2018-09-23 DIAGNOSIS — E213 Hyperparathyroidism, unspecified: Secondary | ICD-10-CM

## 2018-09-23 LAB — BASIC METABOLIC PANEL
BUN: 18 mg/dL (ref 6–23)
CO2: 30 mEq/L (ref 19–32)
Calcium: 10.2 mg/dL (ref 8.4–10.5)
Chloride: 104 mEq/L (ref 96–112)
Creatinine, Ser: 0.76 mg/dL (ref 0.40–1.20)
GFR: 97.62 mL/min (ref >60.00)
Glucose, Bld: 93 mg/dL (ref 70–99)
POTASSIUM: 3.3 meq/L — AB (ref 3.5–5.1)
SODIUM: 143 meq/L (ref 135–145)

## 2018-09-23 LAB — VITAMIN D 25 HYDROXY (VIT D DEFICIENCY, FRACTURES): VITD: 58.55 ng/mL (ref 30.00–100.00)

## 2018-09-23 MED ORDER — TRIAMCINOLONE ACETONIDE 0.1 % EX CREA: 1.0000 "application " | TOPICAL_CREAM | Freq: Three times a day (TID) | CUTANEOUS | 0 refills | Status: DC | PRN

## 2018-09-23 NOTE — Progress Notes (Signed)
Subjective:    Patient ID: Katelyn [name], female    DOB: [birth year], 67 y.o.   MRN: [id number]  HPI Pt returns for fu of primary hyperparathyroidism (dx'ed 2011; she saw surg then, but pt says she declined surgery; she has never had bony fracture; DEXA was normal in 2017).  Vit-D deficiency: she took short-term high dose rx in late 2017, but she did not take in 2018, due to cost.  She takes OTC at uncertain dosage.  Pt states 1 week of slight rash on the right leg, and assoc itching.  She thinks she was stung by an insect.   Past Medical History:  Diagnosis Date  . Acute meniscal tear of knee    RIGHT  . Arthritis    BACK  . Diabetes mellitus, type 2 ([place])   . Hypercholesteremia   . Hypertension     Past Surgical History:  Procedure Laterality Date  . COLONOSCOPY    . COLONOSCOPY N/A 12/01/2017   Procedure: COLONOSCOPY;  Surgeon: Daneil Dolin, MD;  [facility];  Service: Endoscopy;  Laterality: N/A;  10:45  . KNEE ARTHROSCOPY WITH LATERAL MENISECTOMY Right 02/16/2013   Procedure: RIGHT KNEE  ARTHROSCOPY WITH PARTIAL LATERAL MENISECTOMY; Excision of plica with shaving of patella,  partial medial menisectomy;  Surgeon: Magnus Sinning, MD;  Location: [place];  Service: Orthopedics;  Laterality: Right;  . POLYPECTOMY  12/01/2017   Procedure: POLYPECTOMY;  Surgeon: Daneil Dolin, MD;  [facility];  Service: Endoscopy;;    Social History   Socioeconomic History  . Marital status: Divorced    Spouse name: Not on file  . Number of children: Not on file  . Years of education: Not on file  . Highest education level: Not on file  Occupational History  . Not on file  Social Needs  . Financial resource strain: Not on file  . Food insecurity:    Worry: Not on file    Inability: Not on file  . Transportation needs:    Medical: Not on file    Non-medical: Not on file  Tobacco Use  . Smoking status: Never Smoker  . Smokeless  tobacco: Never Used  Substance and Sexual Activity  . Alcohol use: No  . Drug use: No  . Sexual activity: Not on file  Lifestyle  . Physical activity:    Days per week: Not on file    Minutes per session: Not on file  . Stress: Not on file  Relationships  . Social connections:    Talks on phone: Not on file    Gets together: Not on file    Attends religious service: Not on file    Active member of club or organization: Not on file    Attends meetings of clubs or organizations: Not on file    Relationship status: Not on file  . Intimate partner violence:    Fear of current or ex partner: Not on file    Emotionally abused: Not on file    Physically abused: Not on file    Forced sexual activity: Not on file  Other Topics Concern  . Not on file  Social History Narrative  . Not on file    Current Outpatient Medications on File Prior to Visit  Medication Sig Dispense Refill  . amLODipine (NORVASC) 10 MG tablet Take 10 mg by mouth every morning.     Marland Kitchen aspirin EC 81 MG tablet Take 81 mg  by mouth daily.    Marland Kitchen atorvastatin (LIPITOR) 20 MG tablet Take 20 mg by mouth daily.    . cholecalciferol (VITAMIN D) 1000 units tablet Take 1,000 Units by mouth daily.    . furosemide (LASIX) 20 MG tablet TAKE 1 TABLET EVERY DAY (Patient taking differently: Take 20 mg by mouth daily. ) 90 tablet 1  . GLIPIZIDE XL 10 MG 24 hr tablet Take 10 mg by mouth daily.     Marland Kitchen lisinopril (PRINIVIL,ZESTRIL) 20 MG tablet Take 20 mg by mouth daily.    . metFORMIN (GLUCOPHAGE) 1000 MG tablet Take 1,000 mg by mouth 2 (two) times daily.     . OMEGA 3 1000 MG CAPS Take 1 capsule by mouth daily.     No current facility-administered medications on file prior to visit.     No Known Allergies  Family History  Problem Relation Age of Onset  . Colon cancer Father   . Colon cancer Brother   . Hyperparathyroidism Neg Hx     BP (!) 142/80 (BP Location: Left Arm, Patient Position: Sitting, Cuff Size: Large)   Pulse 74    Ht 5\' 7"  (1.702 m)   Wt 191 lb 3.2 oz (86.7 kg)   SpO2 95%   BMI 29.95 kg/m   Review of Systems Denies fever and falls    Objective:   Physical Exam VITAL SIGNS:  See vs page GENERAL: no distress Spine: no kyphosis Ext: no leg edema Skin: right leg: 7 cm area of slight erythema, with a central puncture mark.    Lab Results  Component Value Date   PTH 126 (H) 09/23/2017   CALCIUM 10.2 09/23/2018      Assessment & Plan:  HTN: is noted today Allergic dermatitis, new, prob due to insect bite. Primary hyperparathyroidism: stable.  Does not need surgery.  Recheck DEXA.    Patient Instructions  Your blood pressure is high today.  Please see your primary care provider soon, to have it rechecked Please recheck the bone density.   blood tests are requested for you today.  We'll let you know about the results.  I have sent a prescription to your pharmacy, for the itching.      Please come back for a follow-up appointment in 1 year.

## 2018-09-23 NOTE — Patient Instructions (Addendum)
Your blood pressure is high today.  Please see your primary care provider soon, to have it rechecked Please recheck the bone density.   blood tests are requested for you today.  We'll let you know about the results.  I have sent a prescription to your pharmacy, for the itching.      Please come back for a follow-up appointment in 1 year.

## 2018-09-24 ENCOUNTER — Emergency Department (HOSPITAL_COMMUNITY)
Admission: EM | Admit: 2018-09-24 | Discharge: 2018-09-24 | Disposition: A | Discharge status: Home / Self Care | Payer: Medicare HMO | Attending: Emergency Medicine | Admitting: Emergency Medicine

## 2018-09-24 ENCOUNTER — Encounter (HOSPITAL_COMMUNITY): Payer: Self-pay | Admitting: Emergency Medicine

## 2018-09-24 ENCOUNTER — Other Ambulatory Visit: Payer: Self-pay

## 2018-09-24 ENCOUNTER — Emergency Department (HOSPITAL_COMMUNITY): Payer: Medicare HMO

## 2018-09-24 DIAGNOSIS — M545 Low back pain, unspecified: Secondary | ICD-10-CM

## 2018-09-24 DIAGNOSIS — Z7984 Long term (current) use of oral hypoglycemic drugs: Secondary | ICD-10-CM | POA: Insufficient documentation

## 2018-09-24 DIAGNOSIS — I10 Essential (primary) hypertension: Secondary | ICD-10-CM | POA: Insufficient documentation

## 2018-09-24 DIAGNOSIS — R1030 Lower abdominal pain, unspecified: Secondary | ICD-10-CM | POA: Insufficient documentation

## 2018-09-24 DIAGNOSIS — K449 Diaphragmatic hernia without obstruction or gangrene: Secondary | ICD-10-CM | POA: Diagnosis not present

## 2018-09-24 DIAGNOSIS — Z79899 Other long term (current) drug therapy: Secondary | ICD-10-CM | POA: Diagnosis not present

## 2018-09-24 DIAGNOSIS — K769 Liver disease, unspecified: Secondary | ICD-10-CM | POA: Diagnosis not present

## 2018-09-24 DIAGNOSIS — Z7982 Long term (current) use of aspirin: Secondary | ICD-10-CM | POA: Diagnosis not present

## 2018-09-24 DIAGNOSIS — E119 Type 2 diabetes mellitus without complications: Secondary | ICD-10-CM | POA: Insufficient documentation

## 2018-09-24 LAB — URINALYSIS, ROUTINE W REFLEX MICROSCOPIC
Bacteria, UA: NONE SEEN
Bilirubin Urine: NEGATIVE
Glucose, UA: NEGATIVE mg/dL
Hgb urine dipstick: NEGATIVE
KETONES UR: NEGATIVE mg/dL
Nitrite: NEGATIVE
PROTEIN: NEGATIVE mg/dL
Specific Gravity, Urine: 1.015 (ref 1.005–1.030)
pH: 6 (ref 5.0–8.0)

## 2018-09-24 MED ORDER — METHOCARBAMOL 500 MG PO TABS: 1000.0000 mg | ORAL_TABLET | Freq: Four times a day (QID) | ORAL | 0 refills | Status: DC | PRN

## 2018-09-24 MED ORDER — HYDROCODONE-ACETAMINOPHEN 5-325 MG PO TABS: ORAL_TABLET | ORAL | 0 refills | Status: DC

## 2018-09-24 MED ORDER — OXYCODONE-ACETAMINOPHEN 5-325 MG PO TABS
1.0000 | ORAL_TABLET | Freq: Once | ORAL | Status: AC
  Administered 2018-09-24: 1 via ORAL
  Filled 2018-09-24: qty 1

## 2018-09-24 NOTE — ED Provider Notes (Signed)
[facility] EMERGENCY DEPARTMENT Provider Note   CSN: [id number] Arrival date & time: 09/24/18  4098     History   Chief Complaint Chief Complaint  Patient presents with  . Back Pain    HPI Katelyn [name] is a 67 y.o. female.  HPI  Pt was seen at 0900. Per pt, c/o gradual onset and persistence of constant right sided low back "pain" for the past 1 week. Pt states she "woke up with it." States the pain radiates into her right hip and groin areas. Pain worsens with palpation of the area and body position changes. Denies incont/retention of bowel or bladder, no saddle anesthesia, no focal motor weakness, no tingling/numbness in extremities, no fevers, no injury, no abd pain, no dysuria/hematuria, no N/V/D, no rash. The symptoms have been associated with no other complaints.    Past Medical History:  Diagnosis Date  . Acute meniscal tear of knee    RIGHT  . Arthritis    BACK  . Diabetes mellitus, type 2 ([place])   . Hypercholesteremia   . Hypertension     Patient Active Problem List   Diagnosis Date Noted  . Vitamin D deficiency 03/08/2017  . Hyperparathyroidism ([place]) 01/22/2016  . DM 06/10/2009  . HYPERLIPIDEMIA 06/10/2009  . OBESITY 06/10/2009  . HTN (hypertension) 06/10/2009  . HEMORRHOIDS 06/10/2009  . OTHER CHRONIC NONALCOHOLIC LIVER DISEASE 11/91/4782  . FERRITIN, ELEVATED 06/10/2009  . HEMATOCHEZIA, HX OF 06/10/2009    Past Surgical History:  Procedure Laterality Date  . COLONOSCOPY    . COLONOSCOPY N/A 12/01/2017   Procedure: COLONOSCOPY;  Surgeon: Daneil Dolin, MD;  [facility];  Service: Endoscopy;  Laterality: N/A;  10:45  . KNEE ARTHROSCOPY WITH LATERAL MENISECTOMY Right 02/16/2013   Procedure: RIGHT KNEE  ARTHROSCOPY WITH PARTIAL LATERAL MENISECTOMY; Excision of plica with shaving of patella,  partial medial menisectomy;  Surgeon: Magnus Sinning, MD;  Location: [place];  Service: Orthopedics;  Laterality: Right;  .  POLYPECTOMY  12/01/2017   Procedure: POLYPECTOMY;  Surgeon: Daneil Dolin, MD;  [facility];  Service: Endoscopy;;     OB History   None      Home Medications    Prior to Admission medications   Medication Sig Start Date End Date Taking? Authorizing Provider  amLODipine (NORVASC) 10 MG tablet Take 10 mg by mouth every morning.     [provider]  aspirin EC 81 MG tablet Take 81 mg by mouth daily.    [provider]  atorvastatin (LIPITOR) 20 MG tablet Take 20 mg by mouth daily.    [provider]  cholecalciferol (VITAMIN D) 1000 units tablet Take 1,000 Units by mouth daily.    [provider]  furosemide (LASIX) 20 MG tablet TAKE 1 TABLET EVERY DAY 06/17/18   Renato Shin, MD  GLIPIZIDE XL 10 MG 24 hr tablet Take 10 mg by mouth daily.  09/28/17   [provider]  lisinopril (PRINIVIL,ZESTRIL) 20 MG tablet Take 20 mg by mouth daily.    [provider]  metFORMIN (GLUCOPHAGE) 1000 MG tablet Take 2,000 mg by mouth at bedtime.    [provider]  OMEGA 3 1000 MG CAPS Take 1 capsule by mouth daily.    [provider]  triamcinolone cream (KENALOG) 0.1 % Apply 1 application topically 3 (three) times daily as needed. For itching 09/23/18   Renato Shin, MD    Family History Family History  Problem  Relation Age of Onset  . Colon cancer Father   . Colon cancer Brother   . Hyperparathyroidism Neg Hx     Social History Social History   Tobacco Use  . Smoking status: Never Smoker  . Smokeless tobacco: Never Used  Substance Use Topics  . Alcohol use: No  . Drug use: No     Allergies   Patient has no known allergies.   Review of Systems Review of Systems ROS: Statement: All systems negative except as marked or noted in the HPI; Constitutional: Negative for fever and chills. ; ; Eyes: Negative for eye pain, redness and discharge. ; ; ENMT: Negative for ear pain, hoarseness, nasal congestion,  sinus pressure and sore throat. ; ; Cardiovascular: Negative for chest pain, palpitations, diaphoresis, dyspnea and peripheral edema. ; ; Respiratory: Negative for cough, wheezing and stridor. ; ; Gastrointestinal: Negative for nausea, vomiting, diarrhea, abdominal pain, blood in stool, hematemesis, jaundice and rectal bleeding. . ; ; Genitourinary: Negative for dysuria, flank pain and hematuria. ; ; Musculoskeletal: +LBP. Negative for neck pain. Negative for swelling and trauma.; ; Skin: Negative for pruritus, rash, abrasions, blisters, bruising and skin lesion.; ; Neuro: Negative for headache, lightheadedness and neck stiffness. Negative for weakness, altered level of consciousness, altered mental status, extremity weakness, paresthesias, involuntary movement, seizure and syncope.       Physical Exam Updated Vital Signs BP (!) 172/106 (BP Location: Right Arm)   Pulse 79   Temp 98.3 F (36.8 C) (Oral)   Resp 16   Ht 5\' 7"  (1.702 m)   Wt 86.6 kg   SpO2 98%   BMI 29.91 kg/m   Physical Exam 0905: Physical examination:  Nursing notes reviewed; Vital signs and O2 SAT reviewed;  Constitutional: Well developed, Well nourished, Well hydrated, In no acute distress; Head:  Normocephalic, atraumatic; Eyes: EOMI, PERRL, No scleral icterus; ENMT: Mouth and pharynx normal, Mucous membranes moist; Neck: Supple, Full range of motion, No lymphadenopathy; Cardiovascular: Regular rate and rhythm, No gallop; Respiratory: Breath sounds clear & equal bilaterally, No wheezes.  Speaking full sentences with ease, Normal respiratory effort/excursion; Chest: Nontender, Movement normal; Abdomen: Soft, Nontender, Nondistended, Normal bowel sounds; Genitourinary: No CVA tenderness; Spine:  No midline CS, TS, LS tenderness. +TTP right lumbar paraspinal muscles.;; Extremities: Peripheral pulses normal, Pelvis stable. NT right hip/knee/ankle. NMS intact RLE. No deformity, No edema, No calf edema or asymmetry.; Neuro: AA&Ox3,  Major CN grossly intact.  Speech clear. No gross focal motor or sensory deficits in extremities. Strength 5/5 equal bilat UE's and LE's, including great toe dorsiflexion.  DTR 2/4 equal bilat UE's and LE's.  No gross sensory deficits.  Neg straight leg raises bilat.; Skin: Color normal, Warm, Dry.   ED Treatments / Results  Labs (all labs ordered are listed, but only abnormal results are displayed)   EKG None  Radiology   Procedures Procedures (including critical care time)  Medications Ordered in ED Medications  oxyCODONE-acetaminophen (PERCOCET/ROXICET) 5-325 MG per tablet 1 tablet (has no administration in time range)     Initial Impression / Assessment and Plan / ED Course  I have reviewed the triage vital signs and the nursing notes.  Pertinent labs & imaging results that were available during my care of the patient were reviewed by me and considered in my medical decision making (see chart for details).  MDM Reviewed: previous chart, vitals and nursing note Interpretation: labs and CT scan    Results for orders placed or performed during the hospital  encounter of 09/24/18  Urinalysis, Routine w reflex microscopic  Result Value Ref Range   Color, Urine YELLOW YELLOW   APPearance CLEAR CLEAR   Specific Gravity, Urine 1.015 1.005 - 1.030   pH 6.0 5.0 - 8.0   Glucose, UA NEGATIVE NEGATIVE mg/dL   Hgb urine dipstick NEGATIVE NEGATIVE   Bilirubin Urine NEGATIVE NEGATIVE   Ketones, ur NEGATIVE NEGATIVE mg/dL   Protein, ur NEGATIVE NEGATIVE mg/dL   Nitrite NEGATIVE NEGATIVE   Leukocytes, UA TRACE (A) NEGATIVE   RBC / HPF 0-5 0 - 5 RBC/hpf   WBC, UA 0-5 0 - 5 WBC/hpf   Bacteria, UA NONE SEEN NONE SEEN   Squamous Epithelial / LPF 0-5 0 - 5   Mucus PRESENT    Ct Renal Stone Study Result Date: 09/24/2018 CLINICAL DATA:  67 year old female with mid to lower back pain radiating down the right leg for the past week. EXAM: CT ABDOMEN AND PELVIS WITHOUT CONTRAST TECHNIQUE:  Multidetector CT imaging of the abdomen and pelvis was performed following the standard protocol without IV contrast. COMPARISON:  Prior nuclear medicine sestamibi parathyroid scan 01/31/2010 FINDINGS: Lower chest: The lung bases are clear. Visualized cardiac structures are within normal limits for size. No pericardial effusion. Small hiatal hernia. Hepatobiliary: Mild hypoattenuation of the hepatic parenchyma consistent with hepatic steatosis. There is sparing around the gallbladder fossa. Gallbladder is unremarkable. No intra or extrahepatic biliary ductal dilatation. Pancreas: Unremarkable. No pancreatic ductal dilatation or surrounding inflammatory changes. Spleen: Normal in size without focal abnormality. Adrenals/Urinary Tract: Normal adrenal glands. Abnormal appearance of the kidneys. The renal pyramids are diffusely high attenuation bilaterally and there are multiple small punctate calcifications. The overall appearance is most consistent with medullary nephrocalcinosis. No hydronephrosis or abnormal renal contour. Unremarkable ureters and bladder. Stomach/Bowel: No evidence of obstruction or focal bowel wall thickening. Normal appendix in the right lower quadrant. The terminal ileum is unremarkable. Vascular/Lymphatic: Limited evaluation in the absence of intravenous contrast. Atherosclerotic calcifications throughout the abdominal aorta. No suspicious lymphadenopathy. Reproductive: Multiple dystrophic calcifications throughout the lobular uterus likely representing degenerated uterine fibroids. No adnexal mass. Other: No abdominal wall hernia or abnormality. No abdominopelvic ascites. Musculoskeletal: No acute fracture or aggressive appearing lytic or blastic osseous lesion. Mild grade 1 anterolisthesis of L4 on L5. Advanced facet arthropathy at L4-L5 and L5-S1. IMPRESSION: 1. No acute abnormality within the abdomen or pelvis. 2. Lower lumbar facet arthropathy with mild grade 1 anterolisthesis of L4 on L5.  3. Bilateral medullary nephrocalcinosis the likely secondary to prior hyperparathyroidism. 4. Small hiatal hernia. 5.  Aortic Atherosclerosis (ICD10-170.0). 6. Hepatic steatosis. Electronically Signed   By: Jacqulynn Cadet M.D.   On: 09/24/2018 11:00    1225:  Workup reassuring. Tx symptomatically at this time. Dx and testing d/w pt and family.  Questions answered.  Verb understanding, agreeable to d/c home with outpt f/u.    Final Clinical Impressions(s) / ED Diagnoses   Final diagnoses:  None    ED Discharge Orders    None       Francine Graven, DO 09/26/18 1529

## 2018-09-24 NOTE — Discharge Instructions (Signed)
Take the prescriptions as directed.  Apply moist heat or ice to the area(s) of discomfort, for 15 minutes at a time, several times per day for the next few days.  Do not fall asleep on a heating or ice pack.  Call your regular medical doctor on Monday to schedule a follow up appointment this week.  Return to the Emergency Department immediately if worsening. ° °

## 2018-09-24 NOTE — ED Triage Notes (Signed)
Pt c/oi mid, lower back pain that radiates down RT leg x 1 week. Denies injury.

## 2018-09-24 NOTE — ED Notes (Signed)
Pt returned from CT °

## 2018-09-25 LAB — URINE CULTURE

## 2018-09-26 LAB — PTH, INTACT AND CALCIUM
CALCIUM: 10.1 mg/dL (ref 8.6–10.4)
PTH: 54 pg/mL (ref 14–64)

## 2018-09-27 ENCOUNTER — Emergency Department (HOSPITAL_COMMUNITY): Payer: Medicare HMO

## 2018-09-27 ENCOUNTER — Emergency Department (HOSPITAL_COMMUNITY)
Admission: EM | Admit: 2018-09-27 | Discharge: 2018-09-27 | Disposition: A | Discharge status: Home / Self Care | Payer: Medicare HMO | Attending: Emergency Medicine | Admitting: Emergency Medicine

## 2018-09-27 ENCOUNTER — Other Ambulatory Visit: Payer: Self-pay

## 2018-09-27 ENCOUNTER — Encounter (HOSPITAL_COMMUNITY): Payer: Self-pay | Admitting: *Deleted

## 2018-09-27 DIAGNOSIS — Z7982 Long term (current) use of aspirin: Secondary | ICD-10-CM | POA: Insufficient documentation

## 2018-09-27 DIAGNOSIS — I10 Essential (primary) hypertension: Secondary | ICD-10-CM | POA: Diagnosis not present

## 2018-09-27 DIAGNOSIS — E119 Type 2 diabetes mellitus without complications: Secondary | ICD-10-CM | POA: Diagnosis not present

## 2018-09-27 DIAGNOSIS — R1084 Generalized abdominal pain: Secondary | ICD-10-CM | POA: Diagnosis not present

## 2018-09-27 DIAGNOSIS — Z7984 Long term (current) use of oral hypoglycemic drugs: Secondary | ICD-10-CM | POA: Diagnosis not present

## 2018-09-27 DIAGNOSIS — R1031 Right lower quadrant pain: Secondary | ICD-10-CM | POA: Diagnosis not present

## 2018-09-27 DIAGNOSIS — R109 Unspecified abdominal pain: Secondary | ICD-10-CM

## 2018-09-27 DIAGNOSIS — Z79899 Other long term (current) drug therapy: Secondary | ICD-10-CM | POA: Insufficient documentation

## 2018-09-27 DIAGNOSIS — M25551 Pain in right hip: Secondary | ICD-10-CM | POA: Diagnosis not present

## 2018-09-27 LAB — URINALYSIS, ROUTINE W REFLEX MICROSCOPIC
BILIRUBIN URINE: NEGATIVE
Glucose, UA: NEGATIVE mg/dL
HGB URINE DIPSTICK: NEGATIVE
Ketones, ur: NEGATIVE mg/dL
Leukocytes, UA: NEGATIVE
Nitrite: NEGATIVE
PH: 6 (ref 5.0–8.0)
Protein, ur: NEGATIVE mg/dL
SPECIFIC GRAVITY, URINE: 1.017 (ref 1.005–1.030)

## 2018-09-27 MED ORDER — PREDNISONE 20 MG PO TABS: 40.0000 mg | ORAL_TABLET | Freq: Every day | ORAL | 0 refills | Status: DC

## 2018-09-27 MED ORDER — MORPHINE SULFATE 15 MG PO TABS: 7.5000 mg | ORAL_TABLET | Freq: Four times a day (QID) | ORAL | 0 refills | Status: DC | PRN

## 2018-09-27 MED ORDER — DICLOFENAC EPOLAMINE 1.3 % TD PTCH
1.0000 | MEDICATED_PATCH | Freq: Two times a day (BID) | TRANSDERMAL | Status: DC
  Administered 2018-09-27: 1 via TRANSDERMAL
  Filled 2018-09-27 (×2): qty 1

## 2018-09-27 NOTE — ED Triage Notes (Signed)
Pt c/o right hip pain that started a week ago, denies injury, was seen on 09/24/2018, pt also reports spider bite on right leg as well,

## 2018-09-27 NOTE — Discharge Instructions (Signed)
As discussed, today's evaluation has been generally reassuring There is suspicion for osteoarthritis contributing to your flank and hip pain. Please take all medication as directed and be sure to follow-up with your primary care physician as well as with your orthopedist.

## 2018-09-27 NOTE — ED Provider Notes (Signed)
[facility] EMERGENCY DEPARTMENT Provider Note   CSN: [id number] Arrival date & time: 09/27/18  1257     History   Chief Complaint Chief Complaint  Patient presents with  . Hip Pain    HPI Katelyn [name] is a 67 y.o. female.  HPI Presents with concern of ongoing hip pain. She notes the pain is in the right inferior flank, radiating around the hip towards the inguinal crease. Pain is sore, severe, is worse with activity, ambulation. Patient works as a [employer] manager. No clear precipitant for the illness She has been seen here once during this illness, had studies including CT abdomen pelvis, labs. She notes that she was discharged with Norco, methocarbamol, has had minimal change in her condition since that time. She denies interval changes including abdominal pain, nausea, vomiting, fever.  Past Medical History:  Diagnosis Date  . Acute meniscal tear of knee    RIGHT  . Arthritis    BACK  . Diabetes mellitus, type 2 ([place])   . Hypercholesteremia   . Hypertension     Patient Active Problem List   Diagnosis Date Noted  . Vitamin D deficiency 03/08/2017  . Hyperparathyroidism ([place]) 01/22/2016  . DM 06/10/2009  . HYPERLIPIDEMIA 06/10/2009  . OBESITY 06/10/2009  . HTN (hypertension) 06/10/2009  . HEMORRHOIDS 06/10/2009  . OTHER CHRONIC NONALCOHOLIC LIVER DISEASE 85/63/1497  . FERRITIN, ELEVATED 06/10/2009  . HEMATOCHEZIA, HX OF 06/10/2009    Past Surgical History:  Procedure Laterality Date  . COLONOSCOPY    . COLONOSCOPY N/A 12/01/2017   Procedure: COLONOSCOPY;  Surgeon: Daneil Dolin, MD;  [facility];  Service: Endoscopy;  Laterality: N/A;  10:45  . KNEE ARTHROSCOPY WITH LATERAL MENISECTOMY Right 02/16/2013   Procedure: RIGHT KNEE  ARTHROSCOPY WITH PARTIAL LATERAL MENISECTOMY; Excision of plica with shaving of patella,  partial medial menisectomy;  Surgeon: Magnus Sinning, MD;  Location: [place];  Service: Orthopedics;  Laterality:  Right;  . POLYPECTOMY  12/01/2017   Procedure: POLYPECTOMY;  Surgeon: Daneil Dolin, MD;  [facility];  Service: Endoscopy;;     OB History   None      Home Medications    Prior to Admission medications   Medication Sig Start Date End Date Taking? Authorizing Provider  amLODipine (NORVASC) 10 MG tablet Take 10 mg by mouth every morning.     [provider]  amLODipine-benazepril (LOTREL) 10-20 MG capsule Take 1 capsule by mouth daily.    [provider]  aspirin EC 81 MG tablet Take 81 mg by mouth daily.    [provider]  atorvastatin (LIPITOR) 20 MG tablet Take 20 mg by mouth daily.    [provider]  cholecalciferol (VITAMIN D) 1000 units tablet Take 1,000 Units by mouth daily.    [provider]  furosemide (LASIX) 20 MG tablet TAKE 1 TABLET EVERY DAY Patient taking differently: Take 20 mg by mouth daily.  06/17/18   Renato Shin, MD  GLIPIZIDE XL 10 MG 24 hr tablet Take 10 mg by mouth daily.  09/28/17   [provider]  HYDROcodone-acetaminophen (NORCO/VICODIN) 5-325 MG tablet 1 tab PO q8 hours prn pain 09/24/18   Francine Graven, DO  lisinopril (PRINIVIL,ZESTRIL) 20 MG tablet Take 20 mg by mouth daily.    [provider]  metFORMIN (GLUCOPHAGE) 1000 MG tablet Take 1,000 mg by mouth 2 (two) times daily.     [provider]  methocarbamol (ROBAXIN) 500 MG tablet  Take 2 tablets (1,000 mg total) by mouth 4 (four) times daily as needed for muscle spasms (muscle spasm/pain). 09/24/18   Francine Graven, DO  OMEGA 3 1000 MG CAPS Take 1 capsule by mouth daily.    [provider]  triamcinolone cream (KENALOG) 0.1 % Apply 1 application topically 3 (three) times daily as needed. For itching 09/23/18   Renato Shin, MD    Family History Family History  Problem Relation Age of Onset  . Colon cancer Father   . Colon cancer Brother   . Hyperparathyroidism Neg Hx     Social History Social  History   Tobacco Use  . Smoking status: Never Smoker  . Smokeless tobacco: Never Used  Substance Use Topics  . Alcohol use: No  . Drug use: No     Allergies   Patient has no known allergies.   Review of Systems Review of Systems  Constitutional:       Per HPI, otherwise negative  HENT:       Per HPI, otherwise negative  Respiratory:       Per HPI, otherwise negative  Cardiovascular:       Per HPI, otherwise negative  Gastrointestinal: Negative for vomiting.  Endocrine:       Negative aside from HPI  Genitourinary:       Neg aside from HPI   Musculoskeletal:       Per HPI, otherwise negative  Skin: Negative.   Neurological: Negative for syncope.     Physical Exam Updated Vital Signs BP (!) 171/96   Pulse 69   Temp 98 F (36.7 C) (Oral)   Resp 18   Ht 5\' 7"  (1.702 m)   Wt 86.6 kg   SpO2 97%   BMI 29.91 kg/m   Physical Exam  Constitutional: She is oriented to person, place, and time. She appears well-developed and well-nourished. No distress.  HENT:  Head: Normocephalic and atraumatic.  Eyes: Conjunctivae and EOM are normal.  Cardiovascular: Normal rate and regular rhythm.  Pulmonary/Chest: Effort normal and breath sounds normal. No stridor. No respiratory distress.  Abdominal: She exhibits no distension.  Musculoskeletal: She exhibits no edema.       Arms: Neurological: She is alert and oriented to person, place, and time. No cranial nerve deficit.  Skin: Skin is warm and dry.  Psychiatric: She has a normal mood and affect.  Nursing note and vitals reviewed.    ED Treatments / Results  Labs (all labs ordered are listed, but only abnormal results are displayed) Labs Reviewed  URINALYSIS, ROUTINE W REFLEX MICROSCOPIC - Abnormal; Notable for the following components:      Result Value   APPearance HAZY (*)    All other components within normal limits    EKG None  Radiology Dg Hip Unilat W Or Wo Pelvis 2-3 Views Right  Result Date:  09/27/2018 CLINICAL DATA:  One week of right hip pain with no known injury. Patient reports a bug bite on the right leg as well. EXAM: DG HIP (WITH OR WITHOUT PELVIS) 2-3V RIGHT COMPARISON:  Coronal and sagittal reconstructed images through the pelvis and hips from an abdominal and pelvic CT scan of 3 days ago. FINDINGS: The bony pelvis is subjectively adequately mineralized. There is no lytic nor blastic lesion. AP and lateral views of the right hip reveal narrowing of the joint space. The articular surfaces of the right femoral head and acetabulum remains smoothly rounded. The femoral neck, intertrochanteric, and subtrochanteric regions are normal.  IMPRESSION: There is no acute bony abnormality of the right hip. There is mild joint space loss compatible with early osteoarthritis. Electronically Signed   By: David  Martinique M.D.   On: 09/27/2018 15:39    Procedures Procedures (including critical care time)  Medications Ordered in ED Medications  diclofenac (FLECTOR) 1.3 % 1 patch (1 patch Transdermal Patch Applied 09/27/18 1538)     Initial Impression / Assessment and Plan / ED Course  I have reviewed the triage vital signs and the nursing notes.  Pertinent labs & imaging results that were available during my care of the patient were reviewed by me and considered in my medical decision making (see chart for details).    I reviewed the patient's chart including CT documentation from a few days ago, reassuring. 5:14 PM Today's findings reassuring, discussed with the patient and her daughter. X-ray consistent with osteoarthritis, and given the patient's description of pain, this is likely explaining her worsening condition. No evidence for septic arthritis, no evidence for bacteremia, sepsis, or other acute neck pathology, and with today's, and last week's reassuring studies, the patient is appropriate for initiation of an anti-inflammatory regimen, will follow-up with orthopedics and primary  care.  Final Clinical Impressions(s) / ED Diagnoses  Right flank pain   Carmin Muskrat, MD 09/27/18 1714

## 2018-09-27 NOTE — ED Notes (Signed)
ED Provider at bedside. 

## 2018-09-27 NOTE — ED Notes (Signed)
Patient transported to X-ray 

## 2018-09-29 ENCOUNTER — Ambulatory Visit (INDEPENDENT_AMBULATORY_CARE_PROVIDER_SITE_OTHER)
Admission: RE | Admit: 2018-09-29 | Discharge: 2018-09-29 | Disposition: A | Discharge status: Home / Self Care | Payer: Medicare HMO | Source: Ambulatory Visit | Attending: Endocrinology | Admitting: Endocrinology

## 2018-09-29 DIAGNOSIS — E213 Hyperparathyroidism, unspecified: Secondary | ICD-10-CM

## 2018-10-05 ENCOUNTER — Ambulatory Visit: Payer: Medicare HMO | Admitting: Orthopaedic Surgery

## 2018-10-05 ENCOUNTER — Encounter: Payer: Self-pay | Admitting: Orthopaedic Surgery

## 2018-10-05 ENCOUNTER — Ambulatory Visit (INDEPENDENT_AMBULATORY_CARE_PROVIDER_SITE_OTHER): Payer: Medicare HMO

## 2018-10-05 VITALS — BP 183/96 | HR 70 | Ht 66.0 in | Wt 185.0 lb

## 2018-10-05 DIAGNOSIS — M545 Low back pain, unspecified: Secondary | ICD-10-CM

## 2018-10-05 DIAGNOSIS — M5441 Lumbago with sciatica, right side: Secondary | ICD-10-CM | POA: Diagnosis not present

## 2018-10-05 MED ORDER — PREDNISONE 5 MG (21) PO TBPK: ORAL_TABLET | ORAL | 0 refills | Status: DC

## 2018-10-05 MED ORDER — HYDROCODONE-ACETAMINOPHEN 5-325 MG PO TABS: ORAL_TABLET | ORAL | 0 refills | Status: DC

## 2018-10-05 MED ORDER — NAPROXEN 500 MG PO TABS: 500.0000 mg | ORAL_TABLET | Freq: Two times a day (BID) | ORAL | 5 refills | Status: DC

## 2018-10-05 NOTE — Patient Instructions (Signed)
Out of Work for next two weeks.

## 2018-10-05 NOTE — Progress Notes (Signed)
Subjective:    Patient ID: Katelyn [name], female    DOB: 12/18/1950, 67 y.o.   MRN: [id number]  HPI She has two week history of pain in the right hip and lower back with pain running down the right thigh to just below the knee.  She has no trauma.  She has no weakness, no bowel or bladder problems.  She has tried Tylenol, Advil, rubs, heat and ice.  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  She has been seen in the ER on 09-24-18.  X-rays were done of the right hip and negative.  I have reviewed the ER records and x-rays.  She has also seen her family doctor.  Her pain continues and she is concerned about it.     Review of Systems  Constitutional: Positive for activity change.  Musculoskeletal: Positive for arthralgias, back pain and gait problem.  All other systems reviewed and are negative.  For Review of Systems, all other systems reviewed and are negative.  The following is a summary of the past history medically, past history surgically, known current medicines, social history and family history.  This information is gathered electronically by the computer from prior information and documentation.  I review this each visit and have found including this information at this point in the chart is beneficial and informative.   Past Medical History:  Diagnosis Date  . Acute meniscal tear of knee    RIGHT  . Arthritis    BACK  . Diabetes mellitus, type 2 ([place])   . Hypercholesteremia   . Hypertension     Past Surgical History:  Procedure Laterality Date  . COLONOSCOPY    . COLONOSCOPY N/A 12/01/2017   Procedure: COLONOSCOPY;  Surgeon: Daneil Dolin, MD;  [facility];  Service: Endoscopy;  Laterality: N/A;  10:45  . KNEE ARTHROSCOPY WITH LATERAL MENISECTOMY Right 02/16/2013   Procedure: RIGHT KNEE  ARTHROSCOPY WITH PARTIAL LATERAL MENISECTOMY; Excision of plica with shaving of patella,  partial medial menisectomy;  Surgeon: Magnus Sinning, MD;  Location: [place];  Service: Orthopedics;  Laterality: Right;  . POLYPECTOMY  12/01/2017   Procedure: POLYPECTOMY;  Surgeon: Daneil Dolin, MD;  [facility];  Service: Endoscopy;;    Current Outpatient Medications on File Prior to Visit  Medication Sig Dispense Refill  . amLODipine (NORVASC) 10 MG tablet Take 10 mg by mouth every morning.     Marland Kitchen amLODipine-benazepril (LOTREL) 10-20 MG capsule Take 1 capsule by mouth daily.    Marland Kitchen aspirin EC 81 MG tablet Take 81 mg by mouth daily.    Marland Kitchen atorvastatin (LIPITOR) 20 MG tablet Take 20 mg by mouth daily.    . cholecalciferol (VITAMIN D) 1000 units tablet Take 1,000 Units by mouth daily.    . furosemide (LASIX) 20 MG tablet TAKE 1 TABLET EVERY DAY (Patient taking differently: Take 20 mg by mouth daily. ) 90 tablet 1  . GLIPIZIDE XL 10 MG 24 hr tablet Take 10 mg by mouth daily.     Marland Kitchen lisinopril (PRINIVIL,ZESTRIL) 20 MG tablet Take 20 mg by mouth daily.    . metFORMIN (GLUCOPHAGE) 1000 MG tablet Take 1,000 mg by mouth 2 (two) times daily.     Marland Kitchen morphine (MSIR) 15 MG tablet Take 0.5 tablets (7.5 mg total) by mouth every 6 (six) hours as needed for severe pain. 15 tablet 0  . OMEGA 3 1000 MG CAPS Take 1  capsule by mouth daily.    Marland Kitchen triamcinolone cream (KENALOG) 0.1 % Apply 1 application topically 3 (three) times daily as needed. For itching 45 g 0   No current facility-administered medications on file prior to visit.     Social History   Socioeconomic History  . Marital status: Divorced    Spouse name: Not on file  . Number of children: Not on file  . Years of education: Not on file  . Highest education level: Not on file  Occupational History  . Not on file  Social Needs  . Financial resource strain: Not on file  . Food insecurity:    Worry: Not on file    Inability: Not on file  . Transportation needs:    Medical: Not on file    Non-medical: Not on file  Tobacco Use  . Smoking status: Never Smoker  . Smokeless tobacco:  Never Used  Substance and Sexual Activity  . Alcohol use: No  . Drug use: No  . Sexual activity: Not on file  Lifestyle  . Physical activity:    Days per week: Not on file    Minutes per session: Not on file  . Stress: Not on file  Relationships  . Social connections:    Talks on phone: Not on file    Gets together: Not on file    Attends religious service: Not on file    Active member of club or organization: Not on file    Attends meetings of clubs or organizations: Not on file    Relationship status: Not on file  . Intimate partner violence:    Fear of current or ex partner: Not on file    Emotionally abused: Not on file    Physically abused: Not on file    Forced sexual activity: Not on file  Other Topics Concern  . Not on file  Social History Narrative  . Not on file    Family History  Problem Relation Age of Onset  . Colon cancer Father   . Diabetes Father   . Colon cancer Brother   . Stroke Mother   . Hyperparathyroidism Neg Hx     BP (!) 183/96   Pulse 70   Ht 5\' 6"  (9.563 m)   Wt 185 lb (83.9 kg)   BMI 29.86 kg/m   Body mass index is 29.86 kg/m.     Objective:   Physical Exam Constitutional:      Appearance: She is well-developed.  HENT:     Head: Normocephalic and atraumatic.  Eyes:     Conjunctiva/sclera: Conjunctivae normal.     Pupils: Pupils are equal, round, and reactive to light.  Neck:     Musculoskeletal: Normal range of motion and neck supple.  Cardiovascular:     Rate and Rhythm: Normal rate and regular rhythm.  Pulmonary:     Effort: Pulmonary effort is normal.  Abdominal:     Palpations: Abdomen is soft.  Musculoskeletal:       Back:  Skin:    General: Skin is warm and dry.  Neurological:     Mental Status: She is alert and oriented to person, place, and time.     Cranial Nerves: No cranial nerve deficit.     Motor: No abnormal muscle tone.     Coordination: Coordination normal.     Deep Tendon Reflexes: Reflexes are  normal and symmetric. Reflexes normal.  Psychiatric:        Behavior: Behavior normal.  Thought Content: Thought content normal.        Judgment: Judgment normal.       X-rays were done of the lumbar spine, reported separately.    Assessment & Plan:   Encounter Diagnoses  Name Primary?  . Acute right-sided low back pain with right-sided sciatica   . Lumbar pain Yes   I will begin prednisone dose pack.  Then after she is down to just one pill on the prednisone, begin Naprosyn 500 bid.  Stop the Robaxin given in ER.  She says it makes her feel odd.  I will see her in two weeks. She may need MRI.  Call if any problem.  Precautions discussed.   Electronically [place], MD 12/18/20198:54 AM

## 2018-10-20 ENCOUNTER — Encounter: Payer: Self-pay | Admitting: Orthopaedic Surgery

## 2018-10-20 ENCOUNTER — Ambulatory Visit: Payer: Medicare HMO | Admitting: Orthopaedic Surgery

## 2018-10-20 VITALS — BP 172/89 | HR 78 | Ht 66.0 in | Wt 185.0 lb

## 2018-10-20 DIAGNOSIS — M5441 Lumbago with sciatica, right side: Secondary | ICD-10-CM | POA: Diagnosis not present

## 2018-10-20 NOTE — Patient Instructions (Signed)
May return to work tonight

## 2018-10-20 NOTE — Progress Notes (Signed)
Patient IN:OMVEH Katelyn [name], female DOB:1951-01-23, 68 y.o. MCN:[id number]  Chief Complaint  Patient presents with  . Back Pain    HPI  Katelyn [name] is a 68 y.o. female who has had lower back pain.  The dose pack helped her very much and she is taking the Naprosyn now.  She has back pain but not as bad as before.  She has no numbness, no weakness.  She is better. She would like to return to work.   Body mass index is 29.86 kg/m.  ROS  Review of Systems  Constitutional: Positive for activity change.  Musculoskeletal: Positive for arthralgias, back pain and gait problem.  All other systems reviewed and are negative.   All other systems reviewed and are negative.  The following is a summary of the past history medically, past history surgically, known current medicines, social history and family history.  This information is gathered electronically by the computer from prior information and documentation.  I review this each visit and have found including this information at this point in the chart is beneficial and informative.    Past Medical History:  Diagnosis Date  . Acute meniscal tear of knee    RIGHT  . Arthritis    BACK  . Diabetes mellitus, type 2 ([place])   . Hypercholesteremia   . Hypertension     Past Surgical History:  Procedure Laterality Date  . COLONOSCOPY    . COLONOSCOPY N/A 12/01/2017   Procedure: COLONOSCOPY;  Surgeon: Daneil Dolin, MD;  [facility];  Service: Endoscopy;  Laterality: N/A;  10:45  . KNEE ARTHROSCOPY WITH LATERAL MENISECTOMY Right 02/16/2013   Procedure: RIGHT KNEE  ARTHROSCOPY WITH PARTIAL LATERAL MENISECTOMY; Excision of plica with shaving of patella,  partial medial menisectomy;  Surgeon: Magnus Sinning, MD;  Location: [place];  Service: Orthopedics;  Laterality: Right;  . POLYPECTOMY  12/01/2017   Procedure: POLYPECTOMY;  Surgeon: Daneil Dolin, MD;  [facility];  Service: Endoscopy;;     Family History  Problem Relation Age of Onset  . Colon cancer Father   . Diabetes Father   . Colon cancer Brother   . Stroke Mother   . Hyperparathyroidism Neg Hx     Social History Social History   Tobacco Use  . Smoking status: Never Smoker  . Smokeless tobacco: Never Used  Substance Use Topics  . Alcohol use: No  . Drug use: No    No Known Allergies  Current Outpatient Medications  Medication Sig Dispense Refill  . amLODipine (NORVASC) 10 MG tablet Take 10 mg by mouth every morning.     Marland Kitchen amLODipine-benazepril (LOTREL) 10-20 MG capsule Take 1 capsule by mouth daily.    Marland Kitchen aspirin EC 81 MG tablet Take 81 mg by mouth daily.    Marland Kitchen atorvastatin (LIPITOR) 20 MG tablet Take 20 mg by mouth daily.    . cholecalciferol (VITAMIN D) 1000 units tablet Take 1,000 Units by mouth daily.    . furosemide (LASIX) 20 MG tablet TAKE 1 TABLET EVERY DAY (Patient taking differently: Take 20 mg by mouth daily. ) 90 tablet 1  . HYDROcodone-acetaminophen (NORCO/VICODIN) 5-325 MG tablet One tablet every four hours as needed for acute pain.  Limit of five days per [place] statue. 30 tablet 0  . lisinopril (PRINIVIL,ZESTRIL) 20 MG tablet Take 20 mg by mouth daily.    . metFORMIN (GLUCOPHAGE) 1000 MG tablet Take 1,000 mg by mouth 2 (two) times  daily.     . morphine (MSIR) 15 MG tablet Take 0.5 tablets (7.5 mg total) by mouth every 6 (six) hours as needed for severe pain. 15 tablet 0  . naproxen (NAPROSYN) 500 MG tablet Take 1 tablet (500 mg total) by mouth 2 (two) times daily with a meal. 60 tablet 5  . OMEGA 3 1000 MG CAPS Take 1 capsule by mouth daily.    Marland Kitchen GLIPIZIDE XL 10 MG 24 hr tablet Take 10 mg by mouth daily.     Marland Kitchen triamcinolone cream (KENALOG) 0.1 % Apply 1 application topically 3 (three) times daily as needed. For itching (Patient not taking: Reported on 10/20/2018) 45 g 0   No current facility-administered medications for this visit.      Physical Exam  Blood pressure (!) 172/89,  pulse 78, height 5\' 6"  (1.676 m), weight 185 lb (83.9 kg).  Constitutional: overall normal hygiene, normal nutrition, well developed, normal grooming, normal body habitus. Assistive device:none  Musculoskeletal: gait and station Limp none, muscle tone and strength are normal, no tremors or atrophy is present.  .  Neurological: coordination overall normal.  Deep tendon reflex/nerve stretch intact.  Sensation normal.  Cranial nerves II-XII intact.   Skin:   Normal overall no scars, lesions, ulcers or rashes. No psoriasis.  Psychiatric: Alert and oriented x 3.  Recent memory intact, remote memory unclear.  Normal mood and affect. Well groomed.  Good eye contact.  Cardiovascular: overall no swelling, no varicosities, no edema bilaterally, normal temperatures of the legs and arms, no clubbing, cyanosis and good capillary refill.  Lymphatic: palpation is normal.  Spine/Pelvis examination:  Inspection:  Overall, sacoiliac joint benign and hips nontender; without crepitus or defects.   Thoracic spine inspection: Alignment normal without kyphosis present   Lumbar spine inspection:  Alignment  with normal lumbar lordosis, without scoliosis apparent.   Thoracic spine palpation:  without tenderness of spinal processes   Lumbar spine palpation: without tenderness of lumbar area; without tightness of lumbar muscles    Range of Motion:   Lumbar flexion, forward flexion is normal without pain or tenderness    Lumbar extension is full without pain or tenderness   Left lateral bend is normal without pain or tenderness   Right lateral bend is normal without pain or tenderness   Straight leg raising is normal  Strength & tone: normal   Stability overall normal stability  All other systems reviewed and are negative   The patient has been educated about the nature of the problem(s) and counseled on treatment options.  The patient appeared to understand what I have discussed and is in agreement with  it.  Encounter Diagnosis  Name Primary?  . Acute right-sided low back pain with right-sided sciatica Yes    PLAN Call if any problems.  Precautions discussed.  Continue current medications.   Return to clinic 1 month   She may return to work.  Electronically Signed Sanjuana Kava, MD 1/2/20208:55 AM

## 2018-10-24 ENCOUNTER — Telehealth: Payer: Self-pay | Admitting: Orthopedic Surgery

## 2018-10-24 NOTE — Telephone Encounter (Signed)
Patient requests refill on Hydrocodone/Acetaminophen 5-325  Mgs.  Qty  30       Sig: One tablet every four hours as needed for acute pain. Limit of five days per [place] statue.     Patient states she uses Paediatric nurse in [place]

## 2018-10-25 MED ORDER — HYDROCODONE-ACETAMINOPHEN 5-325 MG PO TABS: ORAL_TABLET | ORAL | 0 refills | Status: DC

## 2018-11-07 ENCOUNTER — Other Ambulatory Visit: Payer: Self-pay | Admitting: Endocrinology

## 2018-11-07 NOTE — Telephone Encounter (Signed)
Please advise if refill is appropriate 

## 2018-11-17 ENCOUNTER — Ambulatory Visit: Payer: Medicare HMO | Admitting: Orthopaedic Surgery

## 2018-11-17 ENCOUNTER — Encounter: Payer: Self-pay | Admitting: Orthopaedic Surgery

## 2018-11-17 DIAGNOSIS — M5136 Other intervertebral disc degeneration, lumbar region: Secondary | ICD-10-CM | POA: Diagnosis not present

## 2018-11-17 MED ORDER — HYDROCODONE-ACETAMINOPHEN 5-325 MG PO TABS: ORAL_TABLET | ORAL | 0 refills | Status: DC

## 2018-11-17 NOTE — Progress Notes (Signed)
Patient PI:RJJOA Katelyn [name], female DOB:18-Aug-1951, 68 y.o. CZY:[id number]  Chief Complaint  Patient presents with  . Back Pain    HPI  Katelyn [name] is a 68 y.o. female who has continued lower back pain with more right sided paresthesias.  She has good and bad days. She is taking the Naprosyn and pain medicine.  She says she is only slightly better and tired of hurting.  I will get a MRI.  She has no weakness, no trauma.   Body mass index is 29.76 kg/m.  ROS  Review of Systems  Constitutional: Positive for activity change.  Musculoskeletal: Positive for arthralgias, back pain and gait problem.  All other systems reviewed and are negative.   All other systems reviewed and are negative.  The following is a summary of the past history medically, past history surgically, known current medicines, social history and family history.  This information is gathered electronically by the computer from prior information and documentation.  I review this each visit and have found including this information at this point in the chart is beneficial and informative.    Past Medical History:  Diagnosis Date  . Acute meniscal tear of knee    RIGHT  . Arthritis    BACK  . Diabetes mellitus, type 2 ([place])   . Hypercholesteremia   . Hypertension     Past Surgical History:  Procedure Laterality Date  . COLONOSCOPY    . COLONOSCOPY N/A 12/01/2017   Procedure: COLONOSCOPY;  Surgeon: Daneil Dolin, MD;  [facility];  Service: Endoscopy;  Laterality: N/A;  10:45  . KNEE ARTHROSCOPY WITH LATERAL MENISECTOMY Right 02/16/2013   Procedure: RIGHT KNEE  ARTHROSCOPY WITH PARTIAL LATERAL MENISECTOMY; Excision of plica with shaving of patella,  partial medial menisectomy;  Surgeon: Magnus Sinning, MD;  Location: [place];  Service: Orthopedics;  Laterality: Right;  . POLYPECTOMY  12/01/2017   Procedure: POLYPECTOMY;  Surgeon: Daneil Dolin, MD;  [facility];  Service: Endoscopy;;    Family History  Problem Relation Age of Onset  . Colon cancer Father   . Diabetes Father   . Colon cancer Brother   . Stroke Mother   . Hyperparathyroidism Neg Hx     Social History Social History   Tobacco Use  . Smoking status: Never Smoker  . Smokeless tobacco: Never Used  Substance Use Topics  . Alcohol use: No  . Drug use: No    No Known Allergies  Current Outpatient Medications  Medication Sig Dispense Refill  . amLODipine (NORVASC) 10 MG tablet Take 10 mg by mouth every morning.     Marland Kitchen amLODipine-benazepril (LOTREL) 10-20 MG capsule Take 1 capsule by mouth daily.    Marland Kitchen aspirin EC 81 MG tablet Take 81 mg by mouth daily.    Marland Kitchen atorvastatin (LIPITOR) 20 MG tablet Take 20 mg by mouth daily.    . cholecalciferol (VITAMIN D) 1000 units tablet Take 1,000 Units by mouth daily.    . furosemide (LASIX) 20 MG tablet TAKE 1 TABLET EVERY DAY 90 tablet 1  . GLIPIZIDE XL 10 MG 24 hr tablet Take 10 mg by mouth daily.     Marland Kitchen HYDROcodone-acetaminophen (NORCO/VICODIN) 5-325 MG tablet One tablet every six hours for pain.  Limit 7 days. 28 tablet 0  . lisinopril (PRINIVIL,ZESTRIL) 20 MG tablet Take 20 mg by mouth daily.    . metFORMIN (GLUCOPHAGE) 1000 MG tablet Take 1,000 mg by mouth 2 (two)  times daily.     Marland Kitchen morphine (MSIR) 15 MG tablet Take 0.5 tablets (7.5 mg total) by mouth every 6 (six) hours as needed for severe pain. 15 tablet 0  . naproxen (NAPROSYN) 500 MG tablet Take 1 tablet (500 mg total) by mouth 2 (two) times daily with a meal. 60 tablet 5  . OMEGA 3 1000 MG CAPS Take 1 capsule by mouth daily.    . rosuvastatin (CRESTOR) 20 MG tablet     . triamcinolone cream (KENALOG) 0.1 % Apply 1 application topically 3 (three) times daily as needed. For itching (Patient not taking: Reported on 10/20/2018) 45 g 0   No current facility-administered medications for this visit.      Physical Exam  Blood pressure (!) 170/91, pulse 80, height 5\' 7"  (1.702 m),  weight 190 lb (86.2 kg).  Constitutional: overall normal hygiene, normal nutrition, well developed, normal grooming, normal body habitus. Assistive device:none  Musculoskeletal: gait and station Limp none, muscle tone and strength are normal, no tremors or atrophy is present.  .  Neurological: coordination overall normal.  Deep tendon reflex/nerve stretch intact.  Sensation normal.  Cranial nerves II-XII intact.   Skin:   Normal overall no scars, lesions, ulcers or rashes. No psoriasis.  Psychiatric: Alert and oriented x 3.  Recent memory intact, remote memory unclear.  Normal mood and affect. Well groomed.  Good eye contact.  Cardiovascular: overall no swelling, no varicosities, no edema bilaterally, normal temperatures of the legs and arms, no clubbing, cyanosis and good capillary refill.  Lymphatic: palpation is normal.  Spine/Pelvis examination:  Inspection:  Overall, sacoiliac joint benign and hips nontender; without crepitus or defects.   Thoracic spine inspection: Alignment normal without kyphosis present   Lumbar spine inspection:  Alignment  with normal lumbar lordosis, without scoliosis apparent.   Thoracic spine palpation:  without tenderness of spinal processes   Lumbar spine palpation: without tenderness of lumbar area; without tightness of lumbar muscles    Range of Motion:   Lumbar flexion, forward flexion is normal without pain or tenderness    Lumbar extension is full without pain or tenderness   Left lateral bend is normal without pain or tenderness   Right lateral bend is normal without pain or tenderness   Straight leg raising is normal  Strength & tone: normal   Stability overall normal stability  All other systems reviewed and are negative   The patient has been educated about the nature of the problem(s) and counseled on treatment options.  The patient appeared to understand what I have discussed and is in agreement with it.  Encounter Diagnosis  Name  Primary?  . Other intervertebral disc degeneration, lumbar region     PLAN Call if any problems.  Precautions discussed.  Continue current medications.   Return to clinic MRI lumbar spine   Electronically Signed Sanjuana Kava, MD 1/30/20208:22 AM

## 2018-11-25 ENCOUNTER — Ambulatory Visit (HOSPITAL_COMMUNITY)
Admission: RE | Admit: 2018-11-25 | Discharge: 2018-11-25 | Disposition: A | Discharge status: Home / Self Care | Payer: Medicare HMO | Source: Ambulatory Visit | Attending: Orthopaedic Surgery | Admitting: Orthopaedic Surgery

## 2018-11-25 DIAGNOSIS — M545 Low back pain: Secondary | ICD-10-CM | POA: Diagnosis not present

## 2018-11-25 DIAGNOSIS — M5136 Other intervertebral disc degeneration, lumbar region: Secondary | ICD-10-CM | POA: Insufficient documentation

## 2018-11-29 ENCOUNTER — Encounter: Payer: Self-pay | Admitting: Orthopaedic Surgery

## 2018-11-29 ENCOUNTER — Ambulatory Visit: Payer: Medicare HMO | Admitting: Orthopaedic Surgery

## 2018-11-29 VITALS — BP 168/99 | HR 74 | Ht 67.0 in | Wt 190.0 lb

## 2018-11-29 DIAGNOSIS — M5136 Other intervertebral disc degeneration, lumbar region: Secondary | ICD-10-CM | POA: Diagnosis not present

## 2018-11-29 NOTE — Progress Notes (Signed)
Patient [name], female DOB:November 23, 1950, 68 y.o. BRA:[id number]  Chief Complaint  Patient presents with  . Back Pain  . Results    review MRI     HPI  Katelyn [name] is a 68 y.o. female who has lower back pain.  She had a MRI which showed: IMPRESSION: 1. Severe L4-5 facet arthritis with prominent edema on the right. Minimal associated anterolisthesis and mild left neural foraminal stenosis. 2. Mild left neural foraminal stenosis at L3-4. 3. No spinal stenosis.  I have explained the findings to her.  I have recommended consideration of epidural injection. She agrees to this.  This will be arranged.   Body mass index is 29.76 kg/m.  ROS  Review of Systems  Constitutional: Positive for activity change.  Musculoskeletal: Positive for arthralgias, back pain and gait problem.  All other systems reviewed and are negative.   All other systems reviewed and are negative.  The following is a summary of the past history medically, past history surgically, known current medicines, social history and family history.  This information is gathered electronically by the computer from prior information and documentation.  I review this each visit and have found including this information at this point in the chart is beneficial and informative.    Past Medical History:  Diagnosis Date  . Acute meniscal tear of knee    RIGHT  . Arthritis    BACK  . Diabetes mellitus, type 2 ([place])   . Hypercholesteremia   . Hypertension     Past Surgical History:  Procedure Laterality Date  . COLONOSCOPY    . COLONOSCOPY N/A 12/01/2017   Procedure: COLONOSCOPY;  Surgeon: Daneil Dolin, MD;  [facility];  Service: Endoscopy;  Laterality: N/A;  10:45  . KNEE ARTHROSCOPY WITH LATERAL MENISECTOMY Right 02/16/2013   Procedure: RIGHT KNEE  ARTHROSCOPY WITH PARTIAL LATERAL MENISECTOMY; Excision of plica with shaving of patella,  partial medial menisectomy;  Surgeon: Magnus Sinning, MD;  Location: [facility];  Service: Orthopedics;  Laterality: Right;  . POLYPECTOMY  12/01/2017   Procedure: POLYPECTOMY;  Surgeon: Daneil Dolin, MD;  [facility];  Service: Endoscopy;;    Family History  Problem Relation Age of Onset  . Colon cancer Father   . Diabetes Father   . Colon cancer Brother   . Stroke Mother   . Hyperparathyroidism Neg Hx     Social History Social History   Tobacco Use  . Smoking status: Never Smoker  . Smokeless tobacco: Never Used  Substance Use Topics  . Alcohol use: No  . Drug use: No    No Known Allergies  Current Outpatient Medications  Medication Sig Dispense Refill  . amLODipine (NORVASC) 10 MG tablet Take 10 mg by mouth every morning.     Marland Kitchen amLODipine-benazepril (LOTREL) 10-20 MG capsule Take 1 capsule by mouth daily.    Marland Kitchen aspirin EC 81 MG tablet Take 81 mg by mouth daily.    Marland Kitchen atorvastatin (LIPITOR) 20 MG tablet Take 20 mg by mouth daily.    . cholecalciferol (VITAMIN D) 1000 units tablet Take 1,000 Units by mouth daily.    . furosemide (LASIX) 20 MG tablet TAKE 1 TABLET EVERY DAY 90 tablet 1  . GLIPIZIDE XL 10 MG 24 hr tablet Take 10 mg by mouth daily.     Marland Kitchen HYDROcodone-acetaminophen (NORCO/VICODIN) 5-325 MG tablet One tablet every six hours for pain.  Limit 7 days. 28 tablet 0  .  lisinopril (PRINIVIL,ZESTRIL) 20 MG tablet Take 20 mg by mouth daily.    . metFORMIN (GLUCOPHAGE) 1000 MG tablet Take 1,000 mg by mouth 2 (two) times daily.     Marland Kitchen morphine (MSIR) 15 MG tablet Take 0.5 tablets (7.5 mg total) by mouth every 6 (six) hours as needed for severe pain. 15 tablet 0  . naproxen (NAPROSYN) 500 MG tablet Take 1 tablet (500 mg total) by mouth 2 (two) times daily with a meal. 60 tablet 5  . OMEGA 3 1000 MG CAPS Take 1 capsule by mouth daily.    . rosuvastatin (CRESTOR) 20 MG tablet     . triamcinolone cream (KENALOG) 0.1 % Apply 1 application topically 3 (three) times daily as needed. For itching  45 g 0   No current facility-administered medications for this visit.      Physical Exam  Blood pressure (!) 168/99, pulse 74, height 5\' 7"  (1.702 m), weight 190 lb (86.2 kg).  Constitutional: overall normal hygiene, normal nutrition, well developed, normal grooming, normal body habitus. Assistive device:none  Musculoskeletal: gait and station Limp none, muscle tone and strength are normal, no tremors or atrophy is present.  .  Neurological: coordination overall normal.  Deep tendon reflex/nerve stretch intact.  Sensation normal.  Cranial nerves II-XII intact.   Skin:   Normal overall no scars, lesions, ulcers or rashes. No psoriasis.  Psychiatric: Alert and oriented x 3.  Recent memory intact, remote memory unclear.  Normal mood and affect. Well groomed.  Good eye contact.  Cardiovascular: overall no swelling, no varicosities, no edema bilaterally, normal temperatures of the legs and arms, no clubbing, cyanosis and good capillary refill.  Lymphatic: palpation is normal.  Spine/Pelvis examination:  Inspection:  Overall, sacoiliac joint benign and hips nontender; without crepitus or defects.   Thoracic spine inspection: Alignment normal without kyphosis present   Lumbar spine inspection:  Alignment  with normal lumbar lordosis, without scoliosis apparent.   Thoracic spine palpation:  without tenderness of spinal processes   Lumbar spine palpation: without tenderness of lumbar area; without tightness of lumbar muscles    Range of Motion:   Lumbar flexion, forward flexion is normal without pain or tenderness    Lumbar extension is full without pain or tenderness   Left lateral bend is normal without pain or tenderness   Right lateral bend is normal without pain or tenderness   Straight leg raising is normal  Strength & tone: normal   Stability overall normal stability  All other systems reviewed and are negative   The patient has been educated about the nature of the  problem(s) and counseled on treatment options.  The patient appeared to understand what I have discussed and is in agreement with it.  Encounter Diagnosis  Name Primary?  . Other intervertebral disc degeneration, lumbar region Yes    PLAN Call if any problems.  Precautions discussed.  Continue current medications.   Return to clinic 1 month   For epidural  Electronically Signed Sanjuana Kava, MD 2/11/20208:39 AM

## 2018-12-15 ENCOUNTER — Ambulatory Visit: Payer: Medicare HMO | Admitting: Orthopaedic Surgery

## 2018-12-22 ENCOUNTER — Encounter (INDEPENDENT_AMBULATORY_CARE_PROVIDER_SITE_OTHER): Payer: Self-pay | Admitting: Physical Medicine and Rehabilitation

## 2018-12-22 ENCOUNTER — Ambulatory Visit (INDEPENDENT_AMBULATORY_CARE_PROVIDER_SITE_OTHER): Payer: Self-pay

## 2018-12-22 ENCOUNTER — Ambulatory Visit (INDEPENDENT_AMBULATORY_CARE_PROVIDER_SITE_OTHER): Payer: Medicare HMO | Admitting: Physical Medicine and Rehabilitation

## 2018-12-22 VITALS — BP 165/100 | Temp 98.2°F

## 2018-12-22 DIAGNOSIS — G8929 Other chronic pain: Secondary | ICD-10-CM | POA: Diagnosis not present

## 2018-12-22 DIAGNOSIS — M47816 Spondylosis without myelopathy or radiculopathy, lumbar region: Secondary | ICD-10-CM

## 2018-12-22 DIAGNOSIS — M545 Low back pain, unspecified: Secondary | ICD-10-CM

## 2018-12-22 MED ORDER — METHYLPREDNISOLONE ACETATE 80 MG/ML IJ SUSP
80.0000 mg | Freq: Once | INTRAMUSCULAR | Status: AC
  Administered 2018-12-22: 80 mg

## 2018-12-22 NOTE — Progress Notes (Signed)
 .  Numeric Pain Rating Scale and Functional Assessment Average Pain 8   In the last MONTH (on 0-10 scale) has pain interfered with the following?  1. General activity like being  able to carry out your everyday physical activities such as walking, climbing stairs, carrying groceries, or moving a chair?  Rating(6)   +Driver, -BT, -Dye Allergies.  

## 2018-12-27 ENCOUNTER — Ambulatory Visit: Payer: Medicare HMO | Admitting: Orthopaedic Surgery

## 2018-12-27 ENCOUNTER — Encounter: Payer: Self-pay | Admitting: Orthopaedic Surgery

## 2018-12-27 VITALS — BP 150/95 | HR 70 | Ht 67.0 in | Wt 184.0 lb

## 2018-12-27 DIAGNOSIS — M5136 Other intervertebral disc degeneration, lumbar region: Secondary | ICD-10-CM

## 2018-12-27 NOTE — Progress Notes (Signed)
Patient Katelyn [name], female DOB:1951/03/17, 68 y.o. ZHY:[id number]  Chief Complaint  Patient presents with  . Back Pain    Low back pain.    HPI  Katelyn [name] is a 68 y.o. female who has low back pain.  She had an epidural and is doing very well now.  She has no pain, has full motion and has returned to normal activities.   Body mass index is 28.82 kg/m.  ROS  Review of Systems  Constitutional: Positive for activity change.  Musculoskeletal: Positive for arthralgias, back pain and gait problem.  All other systems reviewed and are negative.   All other systems reviewed and are negative.  The following is a summary of the past history medically, past history surgically, known current medicines, social history and family history.  This information is gathered electronically by the computer from prior information and documentation.  I review this each visit and have found including this information at this point in the chart is beneficial and informative.    Past Medical History:  Diagnosis Date  . Acute meniscal tear of knee    RIGHT  . Arthritis    BACK  . Diabetes mellitus, type 2 ([place])   . Hypercholesteremia   . Hypertension     Past Surgical History:  Procedure Laterality Date  . COLONOSCOPY    . COLONOSCOPY N/A 12/01/2017   Procedure: COLONOSCOPY;  Surgeon: Katelyn Dolin, MD;  [facility];  Service: Endoscopy;  Laterality: N/A;  10:45  . KNEE ARTHROSCOPY WITH LATERAL MENISECTOMY Right 02/16/2013   Procedure: RIGHT KNEE  ARTHROSCOPY WITH PARTIAL LATERAL MENISECTOMY; Excision of plica with shaving of patella,  partial medial menisectomy;  Surgeon: Katelyn Sinning, MD;  Location: [place];  Service: Orthopedics;  Laterality: Right;  . POLYPECTOMY  12/01/2017   Procedure: POLYPECTOMY;  Surgeon: Katelyn Dolin, MD;  [facility];  Service: Endoscopy;;    Family History  Problem Relation Age of Onset  . Colon  cancer Father   . Diabetes Father   . Colon cancer Brother   . Stroke Mother   . Hyperparathyroidism Neg Hx     Social History Social History   Tobacco Use  . Smoking status: Never Smoker  . Smokeless tobacco: Never Used  Substance Use Topics  . Alcohol use: No  . Drug use: No    No Known Allergies  Current Outpatient Medications  Medication Sig Dispense Refill  . amLODipine (NORVASC) 10 MG tablet Take 10 mg by mouth every morning.     Marland Kitchen amLODipine-benazepril (LOTREL) 10-20 MG capsule Take 1 capsule by mouth daily.    Marland Kitchen aspirin EC 81 MG tablet Take 81 mg by mouth daily.    Marland Kitchen atorvastatin (LIPITOR) 20 MG tablet Take 20 mg by mouth daily.    . cholecalciferol (VITAMIN D) 1000 units tablet Take 1,000 Units by mouth daily.    . furosemide (LASIX) 20 MG tablet TAKE 1 TABLET EVERY DAY 90 tablet 1  . GLIPIZIDE XL 10 MG 24 hr tablet Take 10 mg by mouth daily.     Marland Kitchen HYDROcodone-acetaminophen (NORCO/VICODIN) 5-325 MG tablet One tablet every six hours for pain.  Limit 7 days. 28 tablet 0  . lisinopril (PRINIVIL,ZESTRIL) 20 MG tablet Take 20 mg by mouth daily.    . metFORMIN (GLUCOPHAGE) 1000 MG tablet Take 1,000 mg by mouth 2 (two) times daily.     Marland Kitchen morphine (MSIR) 15 MG tablet Take 0.5 tablets (  7.5 mg total) by mouth every 6 (six) hours as needed for severe pain. 15 tablet 0  . naproxen (NAPROSYN) 500 MG tablet Take 1 tablet (500 mg total) by mouth 2 (two) times daily with a meal. 60 tablet 5  . OMEGA 3 1000 MG CAPS Take 1 capsule by mouth daily.    . rosuvastatin (CRESTOR) 20 MG tablet     . triamcinolone cream (KENALOG) 0.1 % Apply 1 application topically 3 (three) times daily as needed. For itching 45 g 0   No current facility-administered medications for this visit.      Physical Exam  Blood pressure (!) 150/95, pulse 70, height 5\' 7"  (1.702 m), weight 184 lb (83.5 kg).  Constitutional: overall normal hygiene, normal nutrition, well developed, normal grooming, normal body  habitus. Assistive device:none  Musculoskeletal: gait and station Limp none, muscle tone and strength are normal, no tremors or atrophy is present.  .  Neurological: coordination overall normal.  Deep tendon reflex/nerve stretch intact.  Sensation normal.  Cranial nerves II-XII intact.   Skin:   Normal overall no scars, lesions, ulcers or rashes. No psoriasis.  Psychiatric: Alert and oriented x 3.  Recent memory intact, remote memory unclear.  Normal mood and affect. Well groomed.  Good eye contact.  Cardiovascular: overall no swelling, no varicosities, no edema bilaterally, normal temperatures of the legs and arms, no clubbing, cyanosis and good capillary refill.  Lymphatic: palpation is normal.  Spine/Pelvis examination:  Inspection:  Overall, sacoiliac joint benign and hips nontender; without crepitus or defects.   Thoracic spine inspection: Alignment normal without kyphosis present   Lumbar spine inspection:  Alignment  with normal lumbar lordosis, without scoliosis apparent.   Thoracic spine palpation:  without tenderness of spinal processes   Lumbar spine palpation: without tenderness of lumbar area; without tightness of lumbar muscles    Range of Motion:   Lumbar flexion, forward flexion is normal without pain or tenderness    Lumbar extension is full without pain or tenderness   Left lateral bend is normal without pain or tenderness   Right lateral bend is normal without pain or tenderness   Straight leg raising is normal  Strength & tone: normal   Stability overall normal stability  All other systems reviewed and are negative   The patient has been educated about the nature of the problem(s) and counseled on treatment options.  The patient appeared to understand what I have discussed and is in agreement with it.  Encounter Diagnosis  Name Primary?  . Other intervertebral disc degeneration, lumbar region Yes    PLAN Call if any problems.  Precautions discussed.   Continue current medications.   Return to clinic as needed   Electronically Signed Sanjuana Kava, MD 3/10/20209:00 AM

## 2019-01-09 NOTE — Progress Notes (Signed)
ALIENE TAMURA - 68 y.o. female MRN [id number]  Date of birth: 02-03-1951  Office Visit Note: Visit Date: 12/22/2018 PCP: Jake Samples, PA-C Referred by: Jake Samples, PA*  Subjective: Chief Complaint  Patient presents with  . Lower Back - Pain   HPI: MAIGEN MOZINGO is a 68 y.o. female who comes in today At the request of Dr. Sanjuana Kava for diagnostic and therapeutic right L4-5 facet joint block.  Patient is having right more than left low back pain without pain down the leg.  There is no focal nerve compression on MRI or focal stenosis.  We will see how she does with a right L4-5 facet joint block diagnostically hopefully therapeutically.  Patient could be a candidate in the future for radiofrequency ablation of the facet joints.  ROS Otherwise per HPI.  Assessment & Plan: Visit Diagnoses:  1. Spondylosis without myelopathy or radiculopathy, lumbar region   2. Chronic bilateral low back pain without sciatica     Plan: No additional findings.   Meds & Orders:  Meds ordered this encounter  Medications  . methylPREDNISolone acetate (DEPO-MEDROL) injection 80 mg    Orders Placed This Encounter  Procedures  . Facet Injection  . XR C-ARM NO REPORT    Follow-up: Return if symptoms worsen or fail to improve.   Procedures: No procedures performed  Lumbar Facet Joint Intra-Articular Injection(s) with Fluoroscopic Guidance  Patient: Leonor Liv      Date of Birth: [birth year] MRN: [id number] PCP: Jake Samples, PA-C      Visit Date: 12/22/2018   Universal Protocol:    Date/Time: 12/22/2018  Consent Given By: the patient  Position: PRONE   Additional Comments: Vital signs were monitored before and after the procedure. Patient was prepped and draped in the usual sterile fashion. The correct patient, procedure, and site was verified.   Injection Procedure Details:  Procedure Site One Meds Administered:  Meds ordered this encounter   Medications  . methylPREDNISolone acetate (DEPO-MEDROL) injection 80 mg     Laterality: Right  Location/Site:  L4-L5  Needle size: 22 guage  Needle type: Spinal  Needle Placement: Articular  Findings:  -Comments: Excellent flow of contrast producing a partial arthrogram.  Procedure Details: The fluoroscope beam is vertically oriented in AP, and the inferior recess is visualized beneath the lower pole of the inferior apophyseal process, which represents the target point for needle insertion. When direct visualization is difficult the target point is located at the medial projection of the vertebral pedicle. The region overlying each aforementioned target is locally anesthetized with a 1 to 2 ml. volume of 1% Lidocaine without Epinephrine.   The spinal needle was inserted into each of the above mentioned facet joints using biplanar fluoroscopic guidance. A 0.25 to 0.5 ml. volume of Isovue-250 was injected and a partial facet joint arthrogram was obtained. A single spot film was obtained of the resulting arthrogram.    One to 1.25 ml of the steroid/anesthetic solution was then injected into each of the facet joints noted above.   Additional Comments:  The patient tolerated the procedure well Dressing: 2 x 2 sterile gauze and Band-Aid    Post-procedure details: Patient was observed during the procedure. Post-procedure instructions were reviewed.  Patient left the clinic in stable condition.    Clinical History: MRI LUMBAR SPINE WITHOUT CONTRAST  TECHNIQUE: Multiplanar, multisequence MR imaging of the lumbar spine was performed. No intravenous contrast was administered.  COMPARISON:  Lumbar spine radiographs  10/05/2018  FINDINGS: Segmentation:  Standard.  Alignment: Mild lower lumbar dextroscoliosis. Minimal facet mediated anterolisthesis of L4 on L5 and L5 on S1.  Vertebrae: No fracture or suspicious osseous lesion. Prominent asymmetric right-sided facet edema at  L4-5.  Conus medullaris and cauda equina: Conus extends to the T12-L1 level. Conus and cauda equina appear normal.  Paraspinal and other soft tissues: Partially visualized 8 mm T2 hyperintensity in the right kidney, likely a cyst.  Disc levels:  Disc desiccation greatest at L3-4 and L5-S1. Preserved disc space heights.  L1-2: Slight facet hypertrophy without disc herniation or stenosis.  L2-3: Mild facet hypertrophy and shallow right foraminal disc protrusion without stenosis.  L3-4: Disc bulging and moderate facet and ligamentum flavum hypertrophy result in mild left neural foraminal stenosis without spinal stenosis.  L4-5: Mild disc bulging, small left foraminal disc protrusion, mild ligamentum flavum hypertrophy, and severe facet arthrosis result in mild left neural foraminal stenosis without spinal stenosis. Small right facet joint effusion.  L5-S1: Minimal disc bulging and moderate facet hypertrophy without stenosis.  IMPRESSION: 1. Severe L4-5 facet arthritis with prominent edema on the right. Minimal associated anterolisthesis and mild left neural foraminal stenosis. 2. Mild left neural foraminal stenosis at L3-4. 3. No spinal stenosis.   Electronically Signed   By: Logan Bores M.D.   On: 11/25/2018 15:33   She reports that she has never smoked. She has never used smokeless tobacco. No results for input(s): HGBA1C, LABURIC in the last 8760 hours.  Objective:  VS:  HT:    WT:   BMI:     BP:(!) 165/100  HR: bpm  TEMP:98.2 F (36.8 C)(Oral)  RESP:  Physical Exam  Ortho Exam Imaging: No results found.  Past Medical/Family/Surgical/Social History: Medications & Allergies reviewed per EMR, new medications updated. Patient Active Problem List   Diagnosis Date Noted  . Vitamin D deficiency 03/08/2017  . Hyperparathyroidism ([place]) 01/22/2016  . DM 06/10/2009  . HYPERLIPIDEMIA 06/10/2009  . OBESITY 06/10/2009  . HTN (hypertension) 06/10/2009   . HEMORRHOIDS 06/10/2009  . OTHER CHRONIC NONALCOHOLIC LIVER DISEASE 25/02/3975  . FERRITIN, ELEVATED 06/10/2009  . HEMATOCHEZIA, HX OF 06/10/2009   Past Medical History:  Diagnosis Date  . Acute meniscal tear of knee    RIGHT  . Arthritis    BACK  . Diabetes mellitus, type 2 ([place])   . Hypercholesteremia   . Hypertension    Family History  Problem Relation Age of Onset  . Colon cancer Father   . Diabetes Father   . Colon cancer Brother   . Stroke Mother   . Hyperparathyroidism Neg Hx    Past Surgical History:  Procedure Laterality Date  . COLONOSCOPY    . COLONOSCOPY N/A 12/01/2017   Procedure: COLONOSCOPY;  Surgeon: Daneil Dolin, MD;  [facility];  Service: Endoscopy;  Laterality: N/A;  10:45  . KNEE ARTHROSCOPY WITH LATERAL MENISECTOMY Right 02/16/2013   Procedure: RIGHT KNEE  ARTHROSCOPY WITH PARTIAL LATERAL MENISECTOMY; Excision of plica with shaving of patella,  partial medial menisectomy;  Surgeon: Magnus Sinning, MD;  Location: [facility];  Service: Orthopedics;  Laterality: Right;  . POLYPECTOMY  12/01/2017   Procedure: POLYPECTOMY;  Surgeon: Daneil Dolin, MD;  [facility];  Service: Endoscopy;;   Social History   Occupational History  . Not on file  Tobacco Use  . Smoking status: Never Smoker  . Smokeless tobacco: Never Used  Substance and Sexual Activity  . Alcohol use:  No  . Drug use: No  . Sexual activity: Not on file

## 2019-01-09 NOTE — Procedures (Signed)
Lumbar Facet Joint Intra-Articular Injection(s) with Fluoroscopic Guidance  Patient: Katelyn [name]      Date of Birth: 01-23-1951 MRN: [id number] PCP: Jake Samples, PA-C      Visit Date: 12/22/2018   Universal Protocol:    Date/Time: 12/22/2018  Consent Given By: the patient  Position: PRONE   Additional Comments: Vital signs were monitored before and after the procedure. Patient was prepped and draped in the usual sterile fashion. The correct patient, procedure, and site was verified.   Injection Procedure Details:  Procedure Site One Meds Administered:  Meds ordered this encounter  Medications  . methylPREDNISolone acetate (DEPO-MEDROL) injection 80 mg     Laterality: Right  Location/Site:  L4-L5  Needle size: 22 guage  Needle type: Spinal  Needle Placement: Articular  Findings:  -Comments: Excellent flow of contrast producing a partial arthrogram.  Procedure Details: The fluoroscope beam is vertically oriented in AP, and the inferior recess is visualized beneath the lower pole of the inferior apophyseal process, which represents the target point for needle insertion. When direct visualization is difficult the target point is located at the medial projection of the vertebral pedicle. The region overlying each aforementioned target is locally anesthetized with a 1 to 2 ml. volume of 1% Lidocaine without Epinephrine.   The spinal needle was inserted into each of the above mentioned facet joints using biplanar fluoroscopic guidance. A 0.25 to 0.5 ml. volume of Isovue-250 was injected and a partial facet joint arthrogram was obtained. A single spot film was obtained of the resulting arthrogram.    One to 1.25 ml of the steroid/anesthetic solution was then injected into each of the facet joints noted above.   Additional Comments:  The patient tolerated the procedure well Dressing: 2 x 2 sterile gauze and Band-Aid    Post-procedure details: Patient was  observed during the procedure. Post-procedure instructions were reviewed.  Patient left the clinic in stable condition.

## 2019-02-23 DIAGNOSIS — Z1389 Encounter for screening for other disorder: Secondary | ICD-10-CM | POA: Diagnosis not present

## 2019-02-23 DIAGNOSIS — Z6829 Body mass index (BMI) 29.0-29.9, adult: Secondary | ICD-10-CM | POA: Diagnosis not present

## 2019-02-23 DIAGNOSIS — E663 Overweight: Secondary | ICD-10-CM | POA: Diagnosis not present

## 2019-02-23 DIAGNOSIS — I10 Essential (primary) hypertension: Secondary | ICD-10-CM | POA: Diagnosis not present

## 2019-02-23 DIAGNOSIS — E119 Type 2 diabetes mellitus without complications: Secondary | ICD-10-CM | POA: Diagnosis not present

## 2019-02-23 DIAGNOSIS — E7849 Other hyperlipidemia: Secondary | ICD-10-CM | POA: Diagnosis not present

## 2019-03-16 ENCOUNTER — Other Ambulatory Visit: Payer: Self-pay | Admitting: Endocrinology

## 2019-03-30 ENCOUNTER — Telehealth: Payer: Self-pay | Admitting: Endocrinology

## 2019-03-30 NOTE — Telephone Encounter (Signed)
MEDICATION: furosemide (LASIX) 20 MG tablet  PHARMACY:   [facility], [place] [phone] (Phone) [phone] (Fax)    IS THIS A 90 DAY SUPPLY : Yes  IS PATIENT OUT OF MEDICATION: Yes  IF NOT; HOW MUCH IS LEFT: 0  LAST APPOINTMENT DATE: @5 /28/2020  NEXT APPOINTMENT DATE:@12 /04/2019  DO WE HAVE YOUR PERMISSION TO LEAVE A DETAILED MESSAGE: Yes  OTHER COMMENTS:    **Let patient know to contact pharmacy at the end of the day to make sure medication is ready. **  ** Please notify patient to allow 48-72 hours to process**  **Encourage patient to contact the pharmacy for refills or they can request refills through [facility]**

## 2019-03-30 NOTE — Telephone Encounter (Signed)
Request denied by Dr. Loanne Drilling, send to PCP. Called pt and advised to contact PCP for refill. Verbalized acceptance and understanding.

## 2019-04-20 DIAGNOSIS — E7849 Other hyperlipidemia: Secondary | ICD-10-CM | POA: Diagnosis not present

## 2019-04-20 DIAGNOSIS — Z1389 Encounter for screening for other disorder: Secondary | ICD-10-CM | POA: Diagnosis not present

## 2019-04-20 DIAGNOSIS — I10 Essential (primary) hypertension: Secondary | ICD-10-CM | POA: Diagnosis not present

## 2019-04-20 DIAGNOSIS — Z0001 Encounter for general adult medical examination with abnormal findings: Secondary | ICD-10-CM | POA: Diagnosis not present

## 2019-04-20 DIAGNOSIS — E118 Type 2 diabetes mellitus with unspecified complications: Secondary | ICD-10-CM | POA: Diagnosis not present

## 2019-04-20 DIAGNOSIS — Z6829 Body mass index (BMI) 29.0-29.9, adult: Secondary | ICD-10-CM | POA: Diagnosis not present

## 2019-05-11 DIAGNOSIS — Z1231 Encounter for screening mammogram for malignant neoplasm of breast: Secondary | ICD-10-CM | POA: Diagnosis not present

## 2019-05-17 DIAGNOSIS — R928 Other abnormal and inconclusive findings on diagnostic imaging of breast: Secondary | ICD-10-CM | POA: Diagnosis not present

## 2019-05-17 DIAGNOSIS — N6315 Unspecified lump in the right breast, overlapping quadrants: Secondary | ICD-10-CM | POA: Diagnosis not present

## 2019-05-30 DIAGNOSIS — E119 Type 2 diabetes mellitus without complications: Secondary | ICD-10-CM | POA: Diagnosis not present

## 2019-07-19 DIAGNOSIS — E782 Mixed hyperlipidemia: Secondary | ICD-10-CM | POA: Diagnosis not present

## 2019-07-19 DIAGNOSIS — I10 Essential (primary) hypertension: Secondary | ICD-10-CM | POA: Diagnosis not present

## 2019-07-19 DIAGNOSIS — E119 Type 2 diabetes mellitus without complications: Secondary | ICD-10-CM | POA: Diagnosis not present

## 2019-08-24 DIAGNOSIS — I10 Essential (primary) hypertension: Secondary | ICD-10-CM | POA: Diagnosis not present

## 2019-08-24 DIAGNOSIS — Z6829 Body mass index (BMI) 29.0-29.9, adult: Secondary | ICD-10-CM | POA: Diagnosis not present

## 2019-08-24 DIAGNOSIS — E119 Type 2 diabetes mellitus without complications: Secondary | ICD-10-CM | POA: Diagnosis not present

## 2019-08-24 DIAGNOSIS — E7849 Other hyperlipidemia: Secondary | ICD-10-CM | POA: Diagnosis not present

## 2019-08-24 DIAGNOSIS — E663 Overweight: Secondary | ICD-10-CM | POA: Diagnosis not present

## 2019-09-25 ENCOUNTER — Ambulatory Visit (INDEPENDENT_AMBULATORY_CARE_PROVIDER_SITE_OTHER): Payer: Medicare HMO | Admitting: Endocrinology

## 2019-09-25 ENCOUNTER — Other Ambulatory Visit: Payer: Self-pay

## 2019-09-25 ENCOUNTER — Encounter: Payer: Self-pay | Admitting: Endocrinology

## 2019-09-25 VITALS — BP 126/86 | HR 80 | Ht 67.0 in | Wt 186.4 lb

## 2019-09-25 DIAGNOSIS — E213 Hyperparathyroidism, unspecified: Secondary | ICD-10-CM | POA: Diagnosis not present

## 2019-09-25 LAB — VITAMIN D 25 HYDROXY (VIT D DEFICIENCY, FRACTURES): VITD: 71.83 ng/mL (ref 30.00–100.00)

## 2019-09-25 LAB — TSH: TSH: 2.33 u[IU]/mL (ref 0.35–4.50)

## 2019-09-25 NOTE — Patient Instructions (Signed)
Blood tests are requested for you today.  We'll let you know about the results.  Please come back for a follow-up appointment in 1 year.    

## 2019-09-25 NOTE — Progress Notes (Signed)
Subjective:    Patient ID: Katelyn [name], female    DOB: 01-May-1951, 67 y.o.   MRN: [id number]  HPI Pt returns for fu of hyperparathyroidism (dx'ed 2011; she saw surg then, but pt says she declined surgery; she has never had bony fracture; DEXA was normal in 2017; PTH and Ca++ normalized with vit-D supplementation).   Vit-D deficiency: she took short-term high dose rx in late 2017, but she did not take in 2018, due to cost.  She takes OTC, still at uncertain dosage.  Denies muscle cramps.   Past Medical History:  Diagnosis Date  . Acute meniscal tear of knee    RIGHT  . Arthritis    BACK  . Diabetes mellitus, type 2 ([place])   . Hypercholesteremia   . Hypertension     Past Surgical History:  Procedure Laterality Date  . COLONOSCOPY    . COLONOSCOPY N/A 12/01/2017   Procedure: COLONOSCOPY;  Surgeon: Daneil Dolin, MD;  [facility];  Service: Endoscopy;  Laterality: N/A;  10:45  . KNEE ARTHROSCOPY WITH LATERAL MENISECTOMY Right 02/16/2013   Procedure: RIGHT KNEE  ARTHROSCOPY WITH PARTIAL LATERAL MENISECTOMY; Excision of plica with shaving of patella,  partial medial menisectomy;  Surgeon: Magnus Sinning, MD;  Location: [facility];  Service: Orthopedics;  Laterality: Right;  . POLYPECTOMY  12/01/2017   Procedure: POLYPECTOMY;  Surgeon: Daneil Dolin, MD;  [facility];  Service: Endoscopy;;    Social History   Socioeconomic History  . Marital status: Divorced    Spouse name: Not on file  . Number of children: Not on file  . Years of education: Not on file  . Highest education level: Not on file  Occupational History  . Not on file  Social Needs  . Financial resource strain: Not on file  . Food insecurity    Worry: Not on file    Inability: Not on file  . Transportation needs    Medical: Not on file    Non-medical: Not on file  Tobacco Use  . Smoking status: Never Smoker  . Smokeless tobacco: Never Used  Substance and Sexual  Activity  . Alcohol use: No  . Drug use: No  . Sexual activity: Not on file  Lifestyle  . Physical activity    Days per week: Not on file    Minutes per session: Not on file  . Stress: Not on file  Relationships  . Social Herbalist on phone: Not on file    Gets together: Not on file    Attends religious service: Not on file    Active member of club or organization: Not on file    Attends meetings of clubs or organizations: Not on file    Relationship status: Not on file  . Intimate partner violence    Fear of current or ex partner: Not on file    Emotionally abused: Not on file    Physically abused: Not on file    Forced sexual activity: Not on file  Other Topics Concern  . Not on file  Social History Narrative  . Not on file    Current Outpatient Medications on File Prior to Visit  Medication Sig Dispense Refill  . amLODipine (NORVASC) 10 MG tablet Take 10 mg by mouth every morning.     Marland Kitchen amLODipine-benazepril (LOTREL) 10-20 MG capsule Take 1 capsule by mouth daily.    Marland Kitchen aspirin EC 81 MG tablet  Take 81 mg by mouth daily.    Marland Kitchen atorvastatin (LIPITOR) 20 MG tablet Take 20 mg by mouth daily.    . cholecalciferol (VITAMIN D) 1000 units tablet Take 1,000 Units by mouth daily.    . furosemide (LASIX) 20 MG tablet TAKE 1 TABLET EVERY DAY 90 tablet 1  . GLIPIZIDE XL 10 MG 24 hr tablet Take 10 mg by mouth daily.     Marland Kitchen HYDROcodone-acetaminophen (NORCO/VICODIN) 5-325 MG tablet One tablet every six hours for pain.  Limit 7 days. 28 tablet 0  . lisinopril (PRINIVIL,ZESTRIL) 20 MG tablet Take 20 mg by mouth daily.    . metFORMIN (GLUCOPHAGE) 1000 MG tablet Take 1,000 mg by mouth 2 (two) times daily.     Marland Kitchen morphine (MSIR) 15 MG tablet Take 0.5 tablets (7.5 mg total) by mouth every 6 (six) hours as needed for severe pain. 15 tablet 0  . naproxen (NAPROSYN) 500 MG tablet Take 1 tablet (500 mg total) by mouth 2 (two) times daily with a meal. 60 tablet 5  . OMEGA 3 1000 MG CAPS Take  1 capsule by mouth daily.    . rosuvastatin (CRESTOR) 20 MG tablet     . triamcinolone cream (KENALOG) 0.1 % Apply 1 application topically 3 (three) times daily as needed. For itching 45 g 0   No current facility-administered medications on file prior to visit.     No Known Allergies  Family History  Problem Relation Age of Onset  . Colon cancer Father   . Diabetes Father   . Colon cancer Brother   . Stroke Mother   . Hyperparathyroidism Neg Hx     BP 126/86 (BP Location: Left Arm, Patient Position: Sitting, Cuff Size: Large)   Pulse 80   Ht 5\' 7"  (1.702 m)   Wt 186 lb 6.4 oz (84.6 kg)   SpO2 96%   BMI 29.19 kg/m    Review of Systems Denies numbness    Objective:   Physical Exam VITAL SIGNS:  See vs page GENERAL: no distress SPINE: no kyphosis.    Vit-D=normal    Assessment & Plan:  Vit-D def: well-replaced.  Please continue the same medication.  Hyperparathyroidism: recheck today.   Patient Instructions  Blood tests are requested for you today.  We'll let you know about the results.  Please come back for a follow-up appointment in 1 year.

## 2019-09-26 LAB — PTH, INTACT AND CALCIUM
Calcium: 10.6 mg/dL — ABNORMAL HIGH (ref 8.6–10.4)
PTH: 56 pg/mL (ref 14–64)

## 2019-10-19 DIAGNOSIS — I10 Essential (primary) hypertension: Secondary | ICD-10-CM | POA: Diagnosis not present

## 2019-10-19 DIAGNOSIS — E119 Type 2 diabetes mellitus without complications: Secondary | ICD-10-CM | POA: Diagnosis not present

## 2019-10-19 DIAGNOSIS — E7849 Other hyperlipidemia: Secondary | ICD-10-CM | POA: Diagnosis not present

## 2019-12-17 DIAGNOSIS — I10 Essential (primary) hypertension: Secondary | ICD-10-CM | POA: Diagnosis not present

## 2019-12-17 DIAGNOSIS — E119 Type 2 diabetes mellitus without complications: Secondary | ICD-10-CM | POA: Diagnosis not present

## 2019-12-17 DIAGNOSIS — E7849 Other hyperlipidemia: Secondary | ICD-10-CM | POA: Diagnosis not present

## 2020-01-15 DIAGNOSIS — I10 Essential (primary) hypertension: Secondary | ICD-10-CM | POA: Diagnosis not present

## 2020-01-17 DIAGNOSIS — E119 Type 2 diabetes mellitus without complications: Secondary | ICD-10-CM | POA: Diagnosis not present

## 2020-01-17 DIAGNOSIS — I10 Essential (primary) hypertension: Secondary | ICD-10-CM | POA: Diagnosis not present

## 2020-01-17 DIAGNOSIS — E7849 Other hyperlipidemia: Secondary | ICD-10-CM | POA: Diagnosis not present

## 2020-02-22 DIAGNOSIS — Z1389 Encounter for screening for other disorder: Secondary | ICD-10-CM | POA: Diagnosis not present

## 2020-02-22 DIAGNOSIS — E119 Type 2 diabetes mellitus without complications: Secondary | ICD-10-CM | POA: Diagnosis not present

## 2020-02-22 DIAGNOSIS — E213 Hyperparathyroidism, unspecified: Secondary | ICD-10-CM | POA: Diagnosis not present

## 2020-02-22 DIAGNOSIS — I10 Essential (primary) hypertension: Secondary | ICD-10-CM | POA: Diagnosis not present

## 2020-02-22 DIAGNOSIS — E782 Mixed hyperlipidemia: Secondary | ICD-10-CM | POA: Diagnosis not present

## 2020-02-22 DIAGNOSIS — Z0000 Encounter for general adult medical examination without abnormal findings: Secondary | ICD-10-CM | POA: Diagnosis not present

## 2020-02-22 DIAGNOSIS — E6609 Other obesity due to excess calories: Secondary | ICD-10-CM | POA: Diagnosis not present

## 2020-02-22 DIAGNOSIS — Z6830 Body mass index (BMI) 30.0-30.9, adult: Secondary | ICD-10-CM | POA: Diagnosis not present

## 2020-02-22 DIAGNOSIS — E7849 Other hyperlipidemia: Secondary | ICD-10-CM | POA: Diagnosis not present

## 2020-06-16 IMAGING — CT CT RENAL STONE PROTOCOL
2 of 4 series · 16 of 46 positions shown, 18 images · non-contrast
Comparison: Prior nuclear medicine sestamibi parathyroid scan
01/31/2010

CLINICAL DATA: 67-year-old female with mid to lower back pain
radiating down the right leg for the past week.

EXAM:
CT ABDOMEN AND PELVIS WITHOUT CONTRAST
TECHNIQUE: Multidetector CT imaging of the abdomen and pelvis was performed
following the standard protocol without IV contrast.

[Series 2: axial st · axial · 0.67mm/px · z∈[+992,+1412]mm · 13 of 92 slices shown, 15 images]
[im 4/92  soft-tissue]
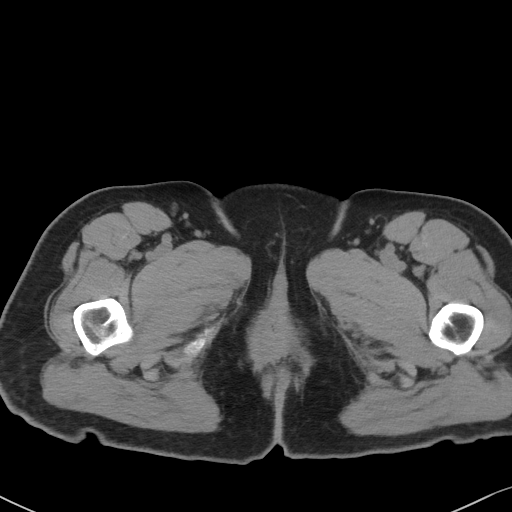
[im 4/92  bone]
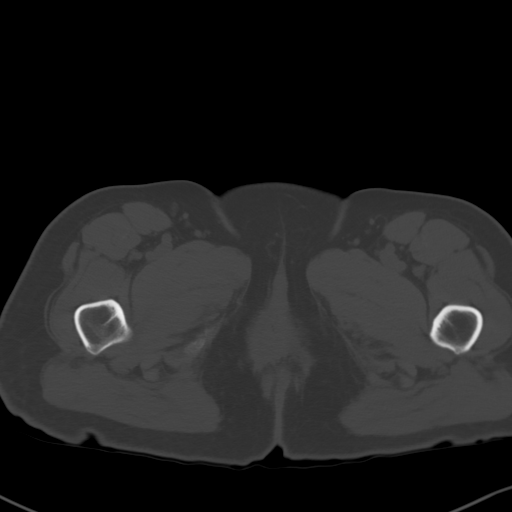
[im 11/92  soft-tissue]
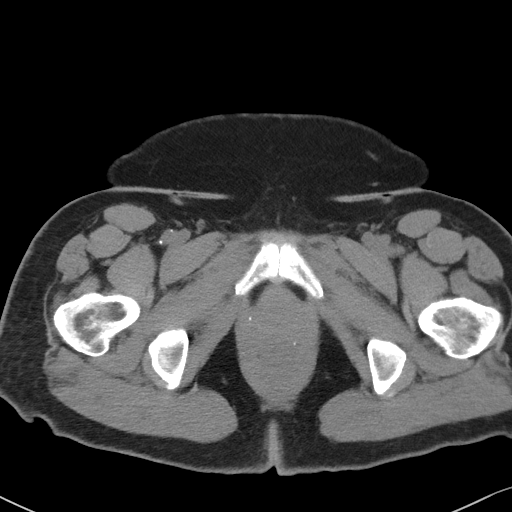
[im 19/92  soft-tissue]
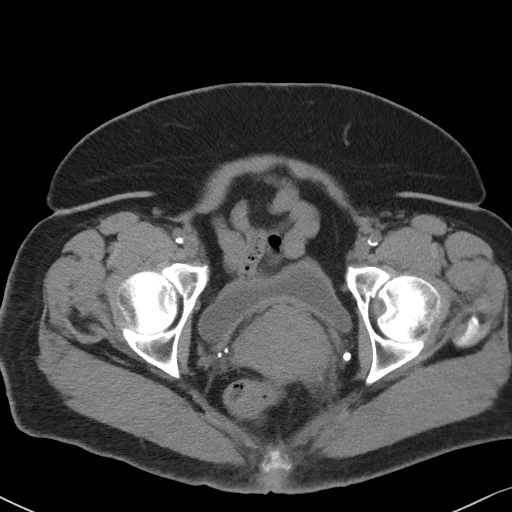
[im 26/92  soft-tissue]
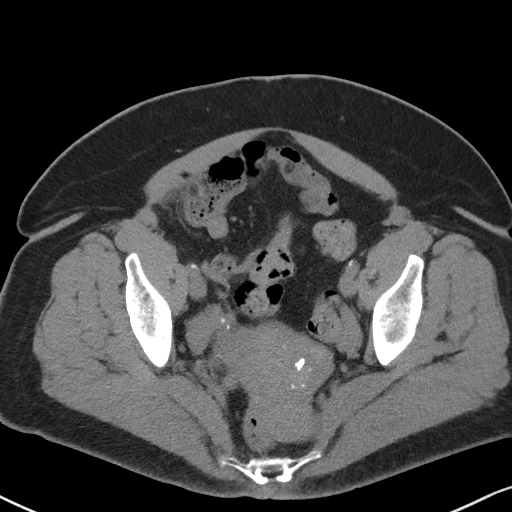
[im 33/92  soft-tissue]
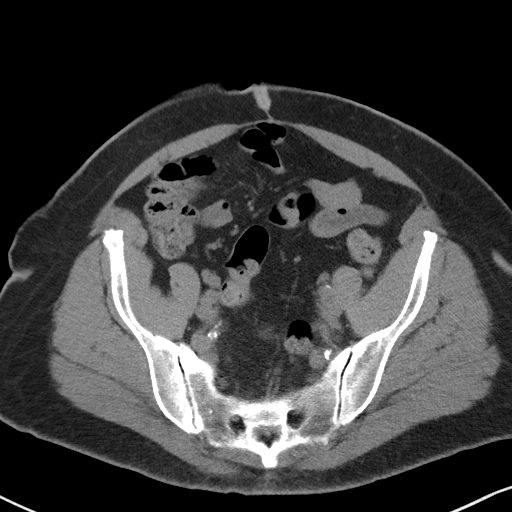
[im 41/92  soft-tissue]
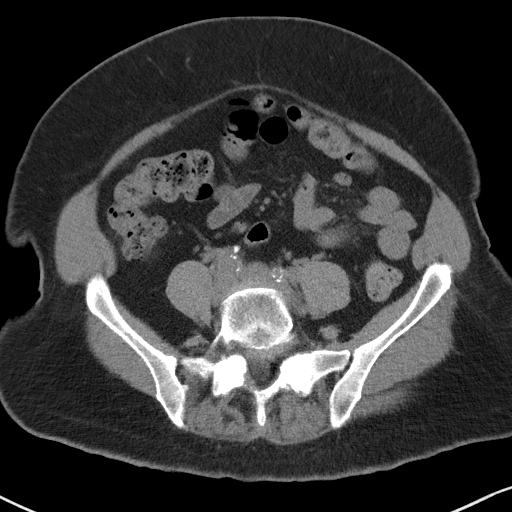
[im 48/92  soft-tissue]
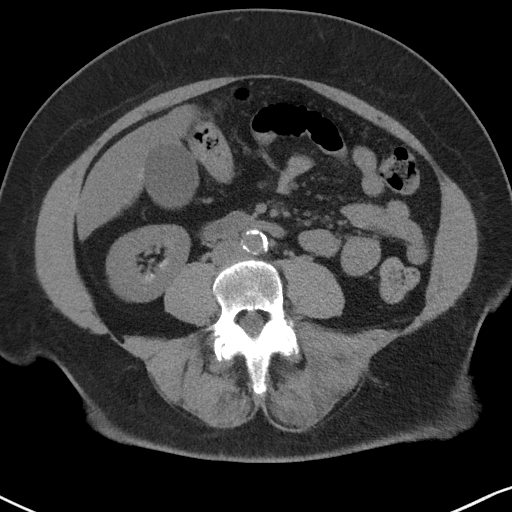
[im 51/92  soft-tissue]
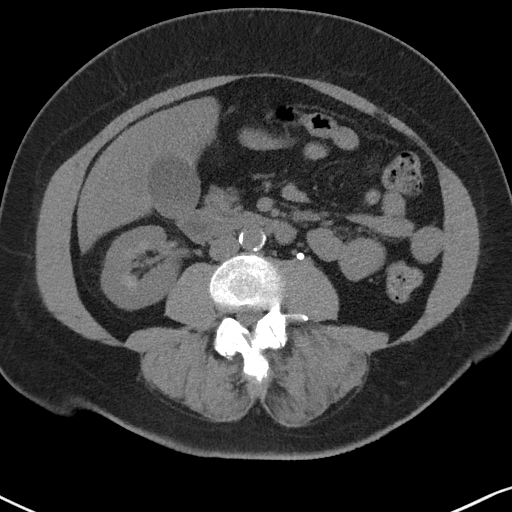
[im 59/92  soft-tissue]
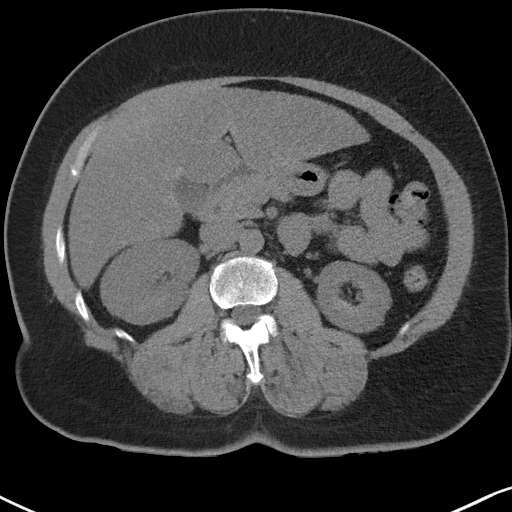
[im 59/92  bone]
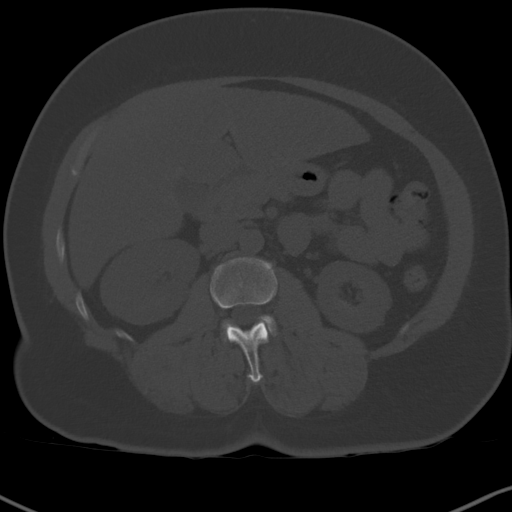
[im 66/92  soft-tissue]
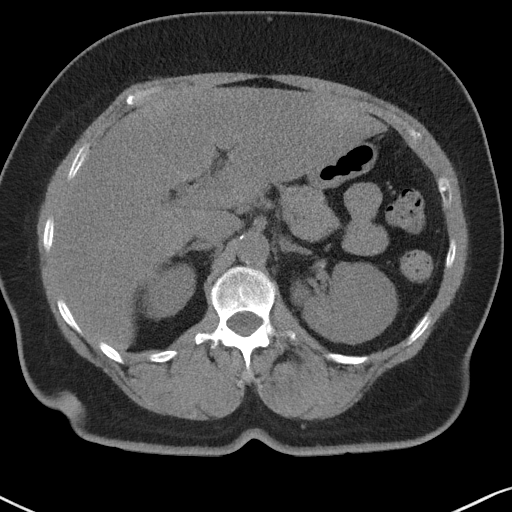
[im 73/92  soft-tissue]
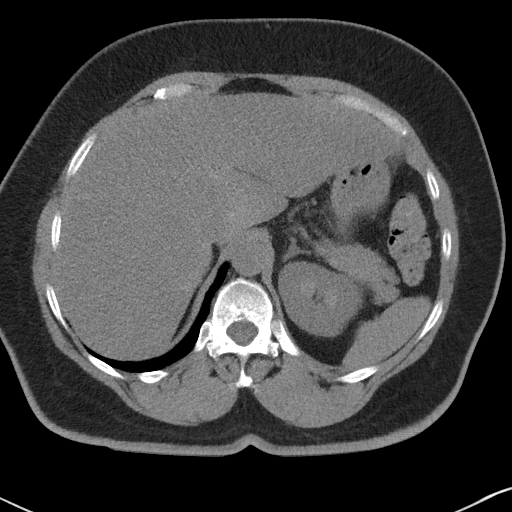
[im 81/92  soft-tissue]
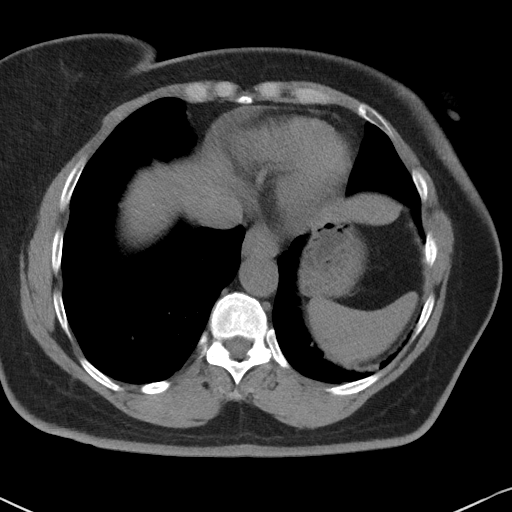
[im 88/92  soft-tissue]
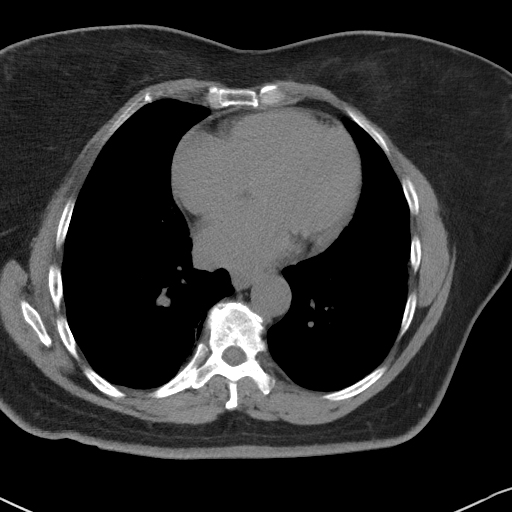

[Series 5: coronal st · coronal · 0.77mm/px · 3 of 105 slices shown]
[im 35/105  soft-tissue]
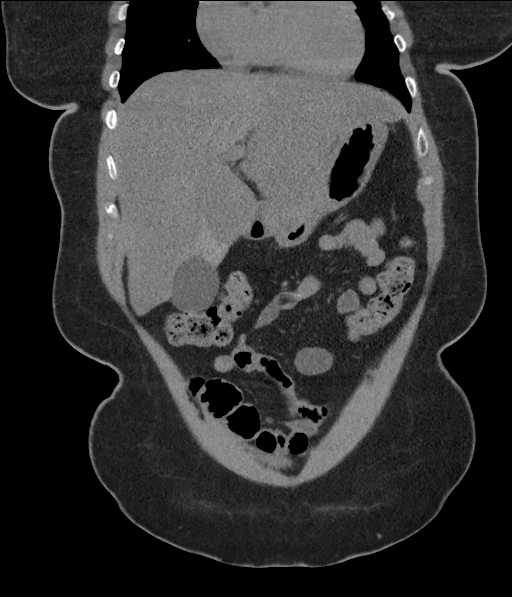
[im 47/105  soft-tissue]
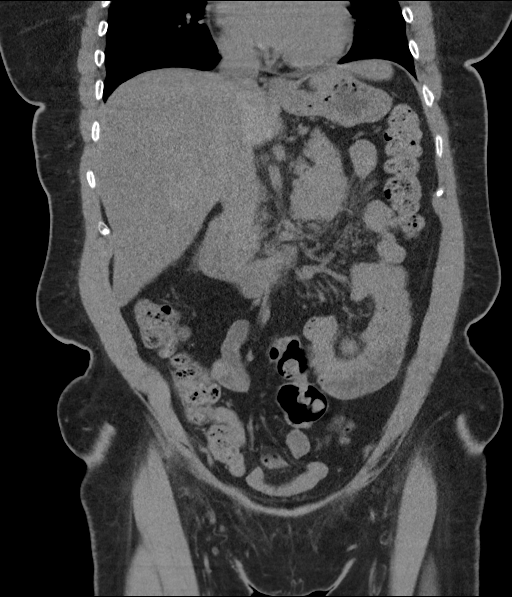
[im 58/105  soft-tissue]
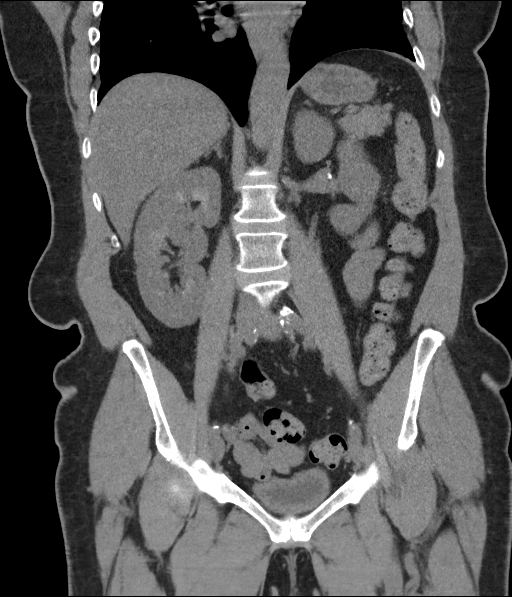

[16 of 46 positions shown; findings below may reference images not displayed]

FINDINGS: Lower chest: The lung bases are clear. Visualized cardiac structures
are within normal limits for size. No pericardial effusion. Small
hiatal hernia.

Hepatobiliary: Mild hypoattenuation of the hepatic parenchyma
consistent with hepatic steatosis. There is sparing around the
gallbladder fossa. Gallbladder is unremarkable. No intra or
extrahepatic biliary ductal dilatation.

Pancreas: Unremarkable. No pancreatic ductal dilatation or
surrounding inflammatory changes.

Spleen: Normal in size without focal abnormality.

Adrenals/Urinary Tract: Normal adrenal glands. Abnormal appearance
of the kidneys. The renal pyramids are diffusely high attenuation
bilaterally and there are multiple small punctate calcifications.
The overall appearance is most consistent with medullary
nephrocalcinosis. No hydronephrosis or abnormal renal contour.
Unremarkable ureters and bladder.

Stomach/Bowel: No evidence of obstruction or focal bowel wall
thickening. Normal appendix in the right lower quadrant. The
terminal ileum is unremarkable.

Vascular/Lymphatic: Limited evaluation in the absence of intravenous
contrast. Atherosclerotic calcifications throughout the abdominal
aorta. No suspicious lymphadenopathy.

Reproductive: Multiple dystrophic calcifications throughout the
lobular uterus likely representing degenerated uterine fibroids. No
adnexal mass.

Other: No abdominal wall hernia or abnormality. No abdominopelvic
ascites.

Musculoskeletal: No acute fracture or aggressive appearing lytic or
blastic osseous lesion. Mild grade 1 anterolisthesis of L4 on L5.
Advanced facet arthropathy at L4-L5 and L5-S1.
IMPRESSION: 1. No acute abnormality within the abdomen or pelvis.
2. Lower lumbar facet arthropathy with mild grade 1 anterolisthesis
of L4 on L5.
3. Bilateral medullary nephrocalcinosis the likely secondary to
prior hyperparathyroidism.
4. Small hiatal hernia.
5.  Aortic Atherosclerosis ([id number]-170.0).
6. Hepatic steatosis.

## 2020-06-19 IMAGING — DX DG HIP (WITH OR WITHOUT PELVIS) 2-3V*R*
3 series · 3 of 3 positions shown · non-contrast
Comparison: Coronal and sagittal reconstructed images through the
pelvis and hips from an abdominal and pelvic CT scan of 3 days ago.

CLINICAL DATA: One week of right hip pain with no known injury.
Patient reports a bug bite on the right leg as well.

EXAM:
DG HIP (WITH OR WITHOUT PELVIS) 2-3V RIGHT

[pelvis ap]
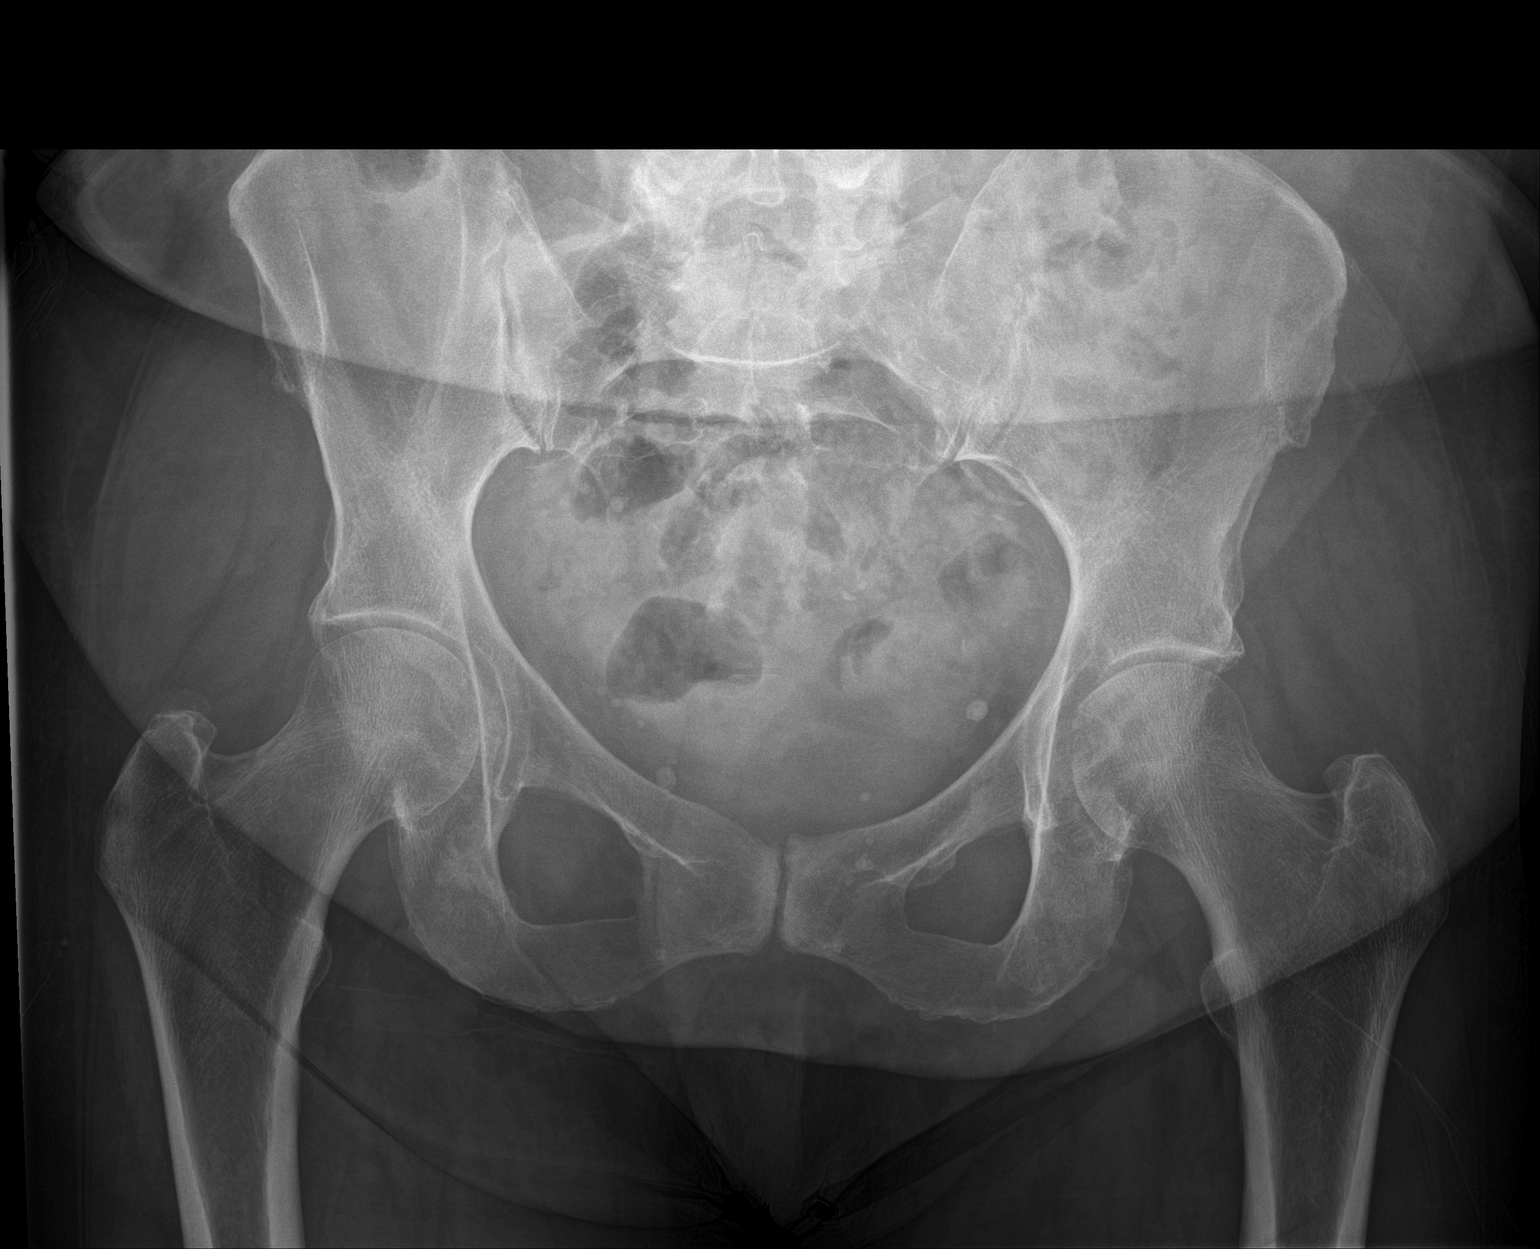

[hip ap]
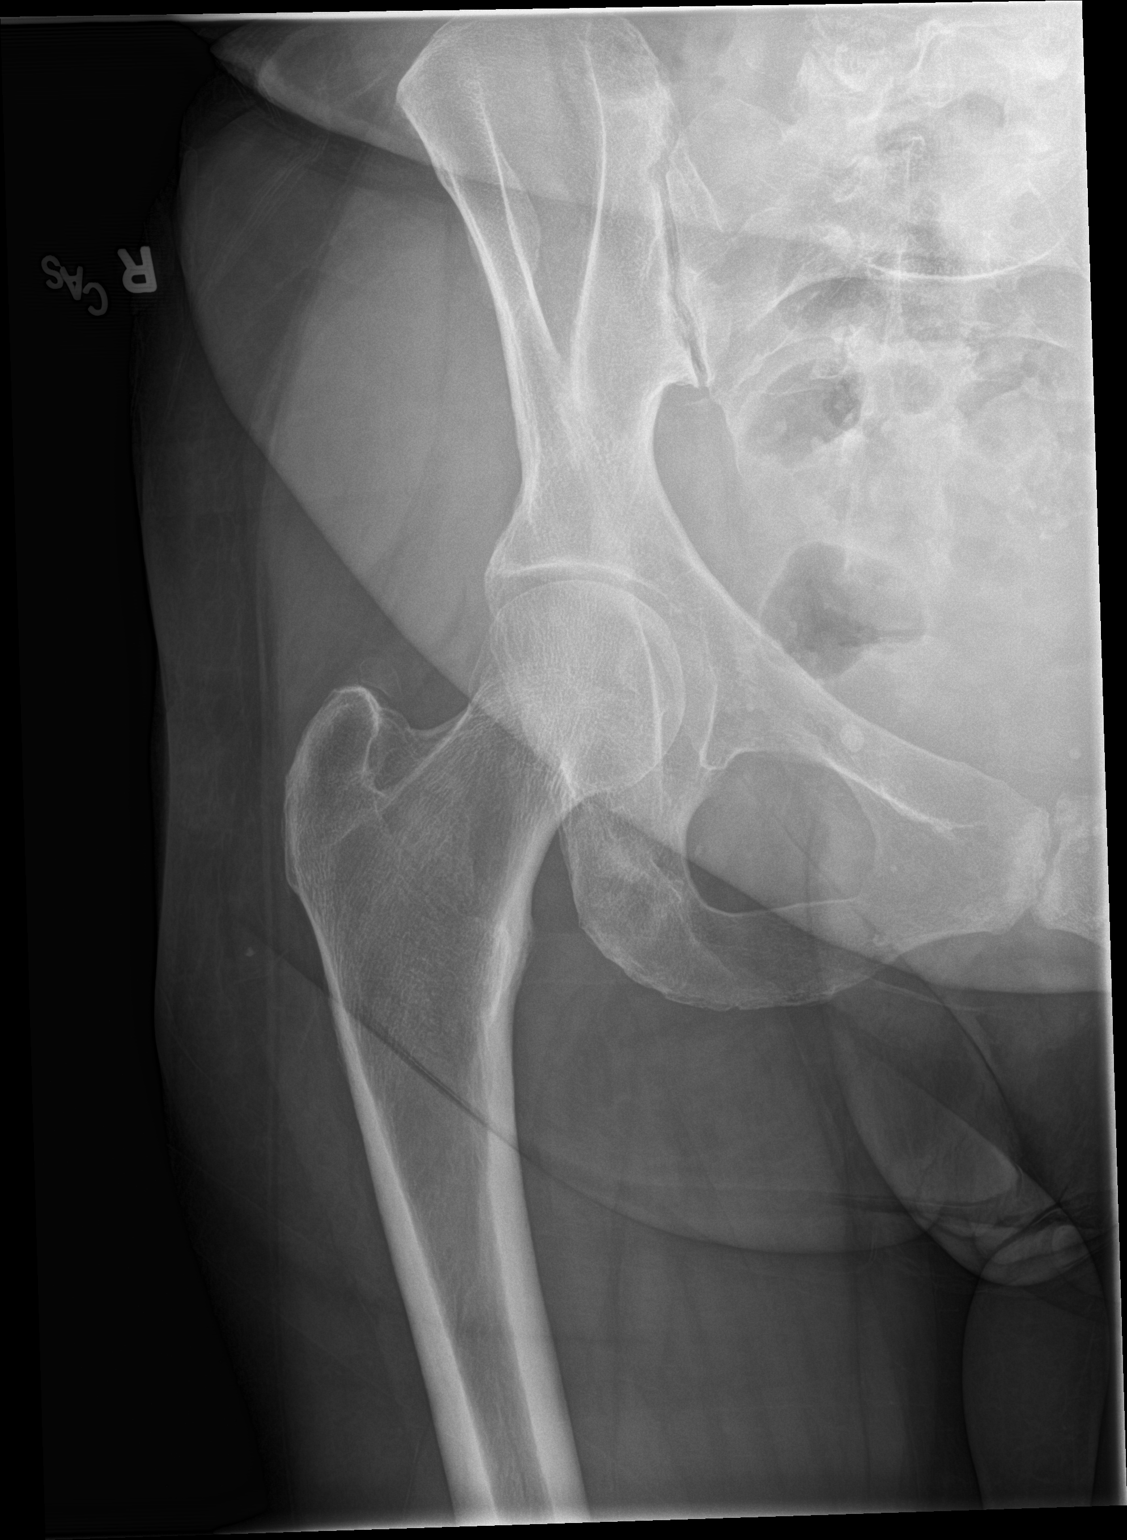

[hip lat]
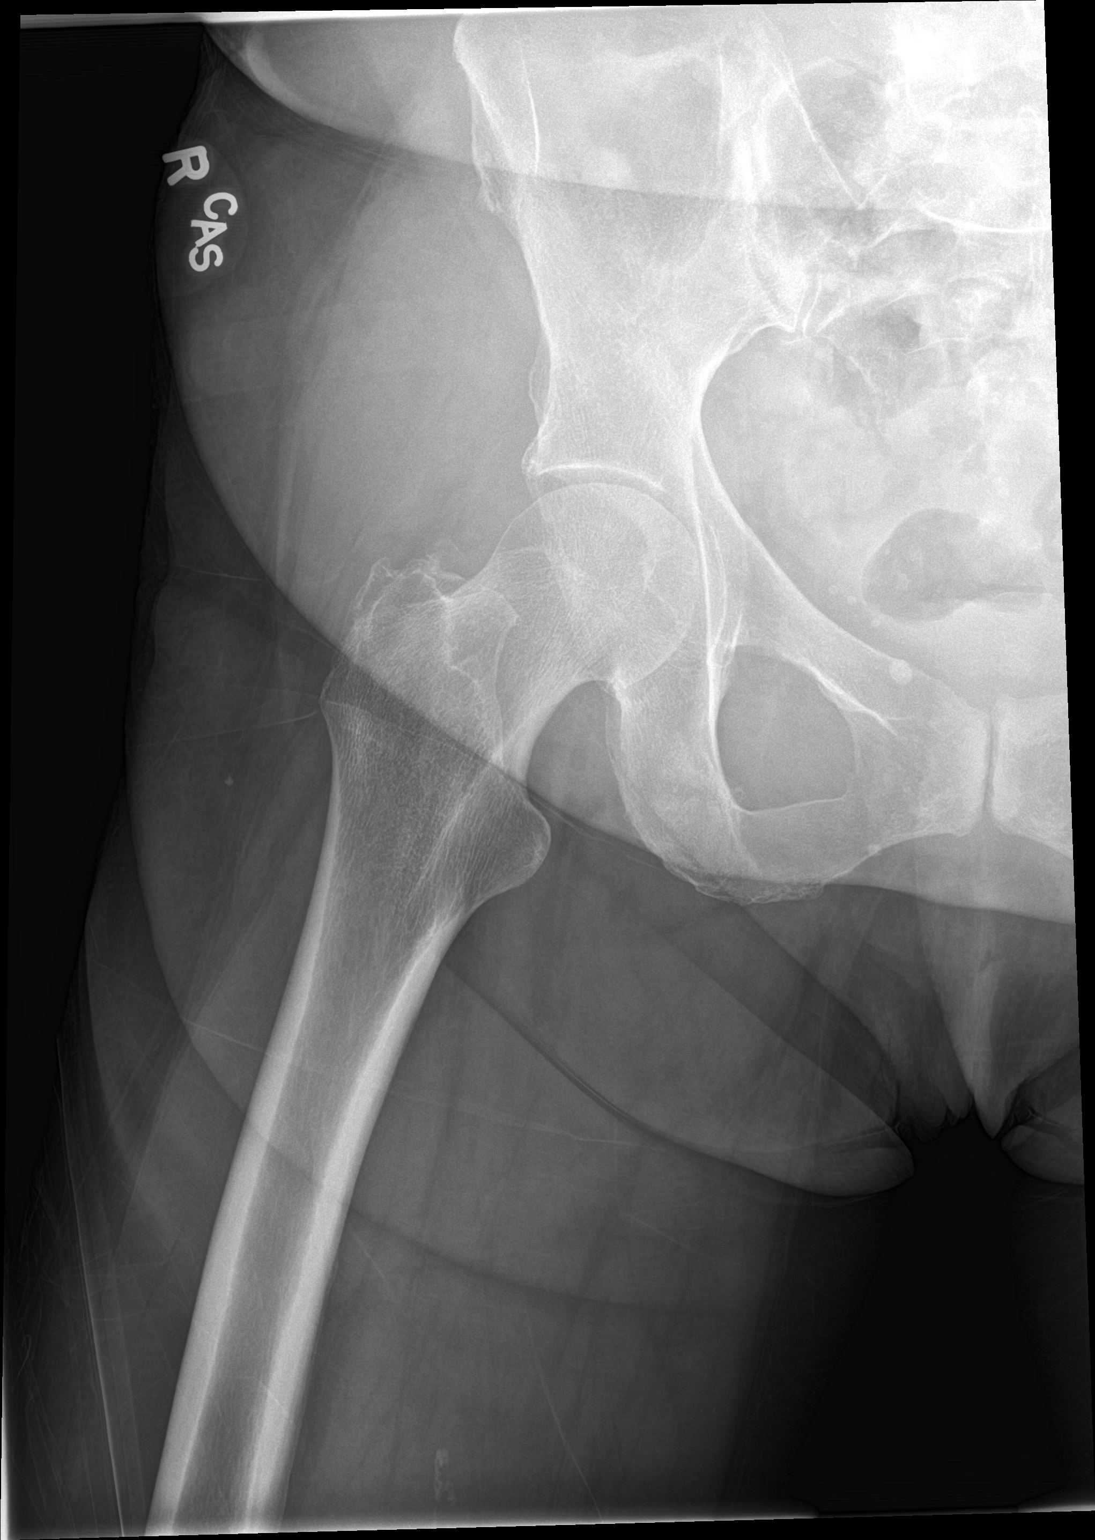

[3 of 3 positions shown; findings below may reference images not displayed]

FINDINGS: The bony pelvis is subjectively adequately mineralized. There is no
lytic nor blastic lesion. AP and lateral views of the right hip
reveal narrowing of the joint space. The articular surfaces of the
right femoral head and acetabulum remains smoothly rounded. The
femoral neck, intertrochanteric, and subtrochanteric regions are
normal.
IMPRESSION: There is no acute bony abnormality of the right hip. There is mild
joint space loss compatible with early osteoarthritis.

## 2020-07-09 DIAGNOSIS — E119 Type 2 diabetes mellitus without complications: Secondary | ICD-10-CM | POA: Diagnosis not present

## 2020-07-09 DIAGNOSIS — H2513 Age-related nuclear cataract, bilateral: Secondary | ICD-10-CM | POA: Diagnosis not present

## 2020-07-09 DIAGNOSIS — H04123 Dry eye syndrome of bilateral lacrimal glands: Secondary | ICD-10-CM | POA: Diagnosis not present

## 2020-08-07 DIAGNOSIS — H25813 Combined forms of age-related cataract, bilateral: Secondary | ICD-10-CM | POA: Diagnosis not present

## 2020-08-07 DIAGNOSIS — E119 Type 2 diabetes mellitus without complications: Secondary | ICD-10-CM | POA: Diagnosis not present

## 2020-08-29 DIAGNOSIS — Z6830 Body mass index (BMI) 30.0-30.9, adult: Secondary | ICD-10-CM | POA: Diagnosis not present

## 2020-08-29 DIAGNOSIS — Z23 Encounter for immunization: Secondary | ICD-10-CM | POA: Diagnosis not present

## 2020-08-29 DIAGNOSIS — E1165 Type 2 diabetes mellitus with hyperglycemia: Secondary | ICD-10-CM | POA: Diagnosis not present

## 2020-08-29 DIAGNOSIS — E118 Type 2 diabetes mellitus with unspecified complications: Secondary | ICD-10-CM | POA: Diagnosis not present

## 2020-08-29 DIAGNOSIS — B351 Tinea unguium: Secondary | ICD-10-CM | POA: Diagnosis not present

## 2020-08-29 DIAGNOSIS — I10 Essential (primary) hypertension: Secondary | ICD-10-CM | POA: Diagnosis not present

## 2020-08-29 DIAGNOSIS — E7849 Other hyperlipidemia: Secondary | ICD-10-CM | POA: Diagnosis not present

## 2020-09-04 DIAGNOSIS — H25011 Cortical age-related cataract, right eye: Secondary | ICD-10-CM | POA: Diagnosis not present

## 2020-09-04 DIAGNOSIS — H2511 Age-related nuclear cataract, right eye: Secondary | ICD-10-CM | POA: Diagnosis not present

## 2020-09-10 DIAGNOSIS — Z961 Presence of intraocular lens: Secondary | ICD-10-CM | POA: Diagnosis not present

## 2020-09-26 ENCOUNTER — Encounter: Payer: Self-pay | Admitting: Endocrinology

## 2020-09-26 ENCOUNTER — Other Ambulatory Visit: Payer: Self-pay

## 2020-09-26 ENCOUNTER — Ambulatory Visit: Payer: Medicare HMO | Admitting: Endocrinology

## 2020-09-26 DIAGNOSIS — Z780 Asymptomatic menopausal state: Secondary | ICD-10-CM | POA: Insufficient documentation

## 2020-09-26 LAB — VITAMIN D 25 HYDROXY (VIT D DEFICIENCY, FRACTURES): VITD: 47.83 ng/mL (ref 30.00–100.00)

## 2020-09-26 NOTE — Progress Notes (Signed)
Subjective:    Patient ID: Katelyn [name], female    DOB: 1951-05-25, 69 y.o.   MRN: [id number]  HPI Pt returns for f/u of mild hypercalcemia and hyperparathyroidism (dx'ed 2011; she saw surg then, but pt says she declined surgery; she has never had bony fracture; DEXA showed osteopenia in 2019; PTH and Ca++ normalized with vit-D supplementation; she has never had urolihtiasis).   Vit-D deficiency: she takes OTC, still at uncertain dosage.  pt states she feels well in general.   Past Medical History:  Diagnosis Date  . Acute meniscal tear of knee    RIGHT  . Arthritis    BACK  . Diabetes mellitus, type 2 ([place])   . Hypercholesteremia   . Hypertension     Past Surgical History:  Procedure Laterality Date  . COLONOSCOPY    . COLONOSCOPY N/A 12/01/2017   Procedure: COLONOSCOPY;  Surgeon: Daneil Dolin, MD;  [facility];  Service: Endoscopy;  Laterality: N/A;  10:45  . KNEE ARTHROSCOPY WITH LATERAL MENISECTOMY Right 02/16/2013   Procedure: RIGHT KNEE  ARTHROSCOPY WITH PARTIAL LATERAL MENISECTOMY; Excision of plica with shaving of patella,  partial medial menisectomy;  Surgeon: Magnus Sinning, MD;  Location: [facility];  Service: Orthopedics;  Laterality: Right;  . POLYPECTOMY  12/01/2017   Procedure: POLYPECTOMY;  Surgeon: Daneil Dolin, MD;  [facility];  Service: Endoscopy;;    Social History   Socioeconomic History  . Marital status: Divorced    Spouse name: Not on file  . Number of children: Not on file  . Years of education: Not on file  . Highest education level: Not on file  Occupational History  . Not on file  Tobacco Use  . Smoking status: Never Smoker  . Smokeless tobacco: Never Used  Vaping Use  . Vaping Use: Never used  Substance and Sexual Activity  . Alcohol use: No  . Drug use: No  . Sexual activity: Not on file  Other Topics Concern  . Not on file  Social History Narrative  . Not on file   Social  Determinants of Health   Financial Resource Strain: Not on file  Food Insecurity: Not on file  Transportation Needs: Not on file  Physical Activity: Not on file  Stress: Not on file  Social Connections: Not on file  Intimate Partner Violence: Not on file    Current Outpatient Medications on File Prior to Visit  Medication Sig Dispense Refill  . amLODipine (NORVASC) 10 MG tablet Take 10 mg by mouth every morning.     Marland Kitchen amLODipine-benazepril (LOTREL) 10-20 MG capsule Take 1 capsule by mouth daily.    Marland Kitchen aspirin EC 81 MG tablet Take 81 mg by mouth daily.    Marland Kitchen atorvastatin (LIPITOR) 20 MG tablet Take 20 mg by mouth daily.    . cholecalciferol (VITAMIN D) 1000 units tablet Take 1,000 Units by mouth daily.    . furosemide (LASIX) 20 MG tablet TAKE 1 TABLET EVERY DAY 90 tablet 1  . HYDROcodone-acetaminophen (NORCO/VICODIN) 5-325 MG tablet One tablet every six hours for pain.  Limit 7 days. 28 tablet 0  . lisinopril (PRINIVIL,ZESTRIL) 20 MG tablet Take 20 mg by mouth daily.    . metFORMIN (GLUCOPHAGE) 1000 MG tablet Take 1,000 mg by mouth 2 (two) times daily.     Marland Kitchen morphine (MSIR) 15 MG tablet Take 0.5 tablets (7.5 mg total) by mouth every 6 (six) hours as needed for severe  pain. 15 tablet 0  . naproxen (NAPROSYN) 500 MG tablet Take 1 tablet (500 mg total) by mouth 2 (two) times daily with a meal. 60 tablet 5  . OMEGA 3 1000 MG CAPS Take 1 capsule by mouth daily.    . rosuvastatin (CRESTOR) 20 MG tablet      No current facility-administered medications on file prior to visit.    No Known Allergies  Family History  Problem Relation Age of Onset  . Colon cancer Father   . Diabetes Father   . Colon cancer Brother   . Stroke Mother   . Hyperparathyroidism Neg Hx     Ht 5\' 7"  (1.702 m)   Wt 196 lb (88.9 kg)   BMI 30.70 kg/m    Review of Systems     Objective:   Physical Exam VITAL SIGNS:  See vs page.  GENERAL: no distress.  EXT: no leg edema.   25-OH vit-D=47     Assessment & Plan:  Vit-D def: well-controlled.  Please continue the same vit-D Hyperparathyroidism: recehck today  Patient Instructions  Blood tests are requested for you today.  We'll let you know about the results.  Please call [phone] to schedule a Bone Density (DexaScan) a the [place] office at [place].  Please do after 10/02/20 Please come back for a follow-up appointment in 6 months.

## 2020-09-26 NOTE — Patient Instructions (Addendum)
Blood tests are requested for you today.  We'll let you know about the results.  Please call [phone] to schedule a Bone Density (DexaScan) a the [place] office at [place].  Please do after 10/02/20 Please come back for a follow-up appointment in 6 months.

## 2020-10-04 LAB — VITAMIN D 1,25 DIHYDROXY
Vitamin D 1, 25 (OH)2 Total: 46 pg/mL (ref 18–72)
Vitamin D2 1, 25 (OH)2: <8 pg/mL
Vitamin D3 1, 25 (OH)2: 46 pg/mL

## 2020-10-04 LAB — PTH, INTACT AND CALCIUM
Calcium: 10.6 mg/dL — ABNORMAL HIGH (ref 8.6–10.4)
PTH: 61 pg/mL (ref 14–64)

## 2020-10-04 LAB — PTH-RELATED PEPTIDE: PTH-Related Protein (PTH-RP): 13 pg/mL (ref 11–20)

## 2020-10-04 LAB — VITAMIN A: Vitamin A (Retinoic Acid): 60 ug/dL (ref 38–98)

## 2020-10-17 DIAGNOSIS — Z23 Encounter for immunization: Secondary | ICD-10-CM | POA: Diagnosis not present

## 2020-10-17 DIAGNOSIS — Z6830 Body mass index (BMI) 30.0-30.9, adult: Secondary | ICD-10-CM | POA: Diagnosis not present

## 2020-10-17 DIAGNOSIS — E7849 Other hyperlipidemia: Secondary | ICD-10-CM | POA: Diagnosis not present

## 2020-10-17 DIAGNOSIS — E6609 Other obesity due to excess calories: Secondary | ICD-10-CM | POA: Diagnosis not present

## 2020-10-21 ENCOUNTER — Inpatient Hospital Stay: Admission: RE | Admit: 2020-10-21 | Payer: Medicare HMO | Source: Ambulatory Visit

## 2020-10-22 DIAGNOSIS — E7849 Other hyperlipidemia: Secondary | ICD-10-CM | POA: Diagnosis not present

## 2020-10-25 ENCOUNTER — Other Ambulatory Visit: Payer: Self-pay

## 2020-10-25 ENCOUNTER — Ambulatory Visit (INDEPENDENT_AMBULATORY_CARE_PROVIDER_SITE_OTHER)
Admission: RE | Admit: 2020-10-25 | Discharge: 2020-10-25 | Disposition: A | Discharge status: Home / Self Care | Payer: Medicare HMO | Source: Ambulatory Visit | Attending: Endocrinology | Admitting: Endocrinology

## 2020-10-25 DIAGNOSIS — Z780 Asymptomatic menopausal state: Secondary | ICD-10-CM

## 2020-12-05 DIAGNOSIS — E7849 Other hyperlipidemia: Secondary | ICD-10-CM | POA: Diagnosis not present

## 2020-12-05 DIAGNOSIS — Z0001 Encounter for general adult medical examination with abnormal findings: Secondary | ICD-10-CM | POA: Diagnosis not present

## 2020-12-05 DIAGNOSIS — E118 Type 2 diabetes mellitus with unspecified complications: Secondary | ICD-10-CM | POA: Diagnosis not present

## 2020-12-05 DIAGNOSIS — Z6830 Body mass index (BMI) 30.0-30.9, adult: Secondary | ICD-10-CM | POA: Diagnosis not present

## 2020-12-05 DIAGNOSIS — I10 Essential (primary) hypertension: Secondary | ICD-10-CM | POA: Diagnosis not present

## 2020-12-05 DIAGNOSIS — E1165 Type 2 diabetes mellitus with hyperglycemia: Secondary | ICD-10-CM | POA: Diagnosis not present

## 2020-12-06 DIAGNOSIS — Z0001 Encounter for general adult medical examination with abnormal findings: Secondary | ICD-10-CM | POA: Diagnosis not present

## 2020-12-06 DIAGNOSIS — Z6830 Body mass index (BMI) 30.0-30.9, adult: Secondary | ICD-10-CM | POA: Diagnosis not present

## 2020-12-06 DIAGNOSIS — E6609 Other obesity due to excess calories: Secondary | ICD-10-CM | POA: Diagnosis not present

## 2021-03-13 DIAGNOSIS — E663 Overweight: Secondary | ICD-10-CM | POA: Diagnosis not present

## 2021-03-13 DIAGNOSIS — N390 Urinary tract infection, site not specified: Secondary | ICD-10-CM | POA: Diagnosis not present

## 2021-03-13 DIAGNOSIS — I10 Essential (primary) hypertension: Secondary | ICD-10-CM | POA: Diagnosis not present

## 2021-03-13 DIAGNOSIS — E119 Type 2 diabetes mellitus without complications: Secondary | ICD-10-CM | POA: Diagnosis not present

## 2021-03-13 DIAGNOSIS — Z6827 Body mass index (BMI) 27.0-27.9, adult: Secondary | ICD-10-CM | POA: Diagnosis not present

## 2021-03-20 ENCOUNTER — Other Ambulatory Visit: Payer: Self-pay

## 2021-03-20 ENCOUNTER — Ambulatory Visit: Payer: Medicare HMO | Admitting: Endocrinology

## 2021-03-20 DIAGNOSIS — E210 Primary hyperparathyroidism: Secondary | ICD-10-CM | POA: Diagnosis not present

## 2021-03-20 LAB — VITAMIN D 25 HYDROXY (VIT D DEFICIENCY, FRACTURES): VITD: 51.48 ng/mL (ref 30.00–100.00)

## 2021-03-20 NOTE — Patient Instructions (Addendum)
Your blood pressure is high today.  Please see your primary care provider soon, to have it rechecked Blood tests are requested for you today.  We'll let you know about the results.  If these results are the same, we'll continue to follow this.  Please come back for a follow-up appointment in 6 months.

## 2021-03-20 NOTE — Progress Notes (Signed)
Subjective:    Patient ID: Katelyn [name], female    DOB: 1951/07/22, 70 y.o.   MRN: [id number]  HPI Pt returns for f/u of mild hypercalcemia and hyperparathyroidism (dx'ed 2011; she saw surg then, but pt says she declined surgery; she has never had bony fracture; DEXA showed osteopenia in 2022; PTH and Ca++ normalized with Vit-D supplementation; she has never had urolithiasis; other labs for hypercalcemia were normal).   Vit-D deficiency: she takes 2000 units/d.  pt states she feels well in general.   Past Medical History:  Diagnosis Date  . Acute meniscal tear of knee    RIGHT  . Arthritis    BACK  . Diabetes mellitus, type 2 ([place])   . Hypercholesteremia   . Hypertension     Past Surgical History:  Procedure Laterality Date  . COLONOSCOPY    . COLONOSCOPY N/A 12/01/2017   Procedure: COLONOSCOPY;  Surgeon: Daneil Dolin, MD;  [facility];  Service: Endoscopy;  Laterality: N/A;  10:45  . KNEE ARTHROSCOPY WITH LATERAL MENISECTOMY Right 02/16/2013   Procedure: RIGHT KNEE  ARTHROSCOPY WITH PARTIAL LATERAL MENISECTOMY; Excision of plica with shaving of patella,  partial medial menisectomy;  Surgeon: Magnus Sinning, MD;  Location: [facility];  Service: Orthopedics;  Laterality: Right;  . POLYPECTOMY  12/01/2017   Procedure: POLYPECTOMY;  Surgeon: Daneil Dolin, MD;  [facility];  Service: Endoscopy;;    Social History   Socioeconomic History  . Marital status: Divorced    Spouse name: Not on file  . Number of children: Not on file  . Years of education: Not on file  . Highest education level: Not on file  Occupational History  . Not on file  Tobacco Use  . Smoking status: Never Smoker  . Smokeless tobacco: Never Used  Vaping Use  . Vaping Use: Never used  Substance and Sexual Activity  . Alcohol use: No  . Drug use: No  . Sexual activity: Not on file  Other Topics Concern  . Not on file  Social History Narrative  . Not on  file   Social Determinants of Health   Financial Resource Strain: Not on file  Food Insecurity: Not on file  Transportation Needs: Not on file  Physical Activity: Not on file  Stress: Not on file  Social Connections: Not on file  Intimate Partner Violence: Not on file    Current Outpatient Medications on File Prior to Visit  Medication Sig Dispense Refill  . amLODipine (NORVASC) 10 MG tablet Take 10 mg by mouth every morning.     Marland Kitchen amLODipine-benazepril (LOTREL) 10-20 MG capsule Take 1 capsule by mouth daily.    Marland Kitchen aspirin EC 81 MG tablet Take 81 mg by mouth daily.    Marland Kitchen atorvastatin (LIPITOR) 20 MG tablet Take 20 mg by mouth daily.    . cholecalciferol (VITAMIN D) 1000 units tablet Take 1,000 Units by mouth daily.    . furosemide (LASIX) 20 MG tablet TAKE 1 TABLET EVERY DAY 90 tablet 1  . HYDROcodone-acetaminophen (NORCO/VICODIN) 5-325 MG tablet One tablet every six hours for pain.  Limit 7 days. 28 tablet 0  . lisinopril (PRINIVIL,ZESTRIL) 20 MG tablet Take 20 mg by mouth daily.    . metFORMIN (GLUCOPHAGE) 1000 MG tablet Take 1,000 mg by mouth 2 (two) times daily.     Marland Kitchen morphine (MSIR) 15 MG tablet Take 0.5 tablets (7.5 mg total) by mouth every 6 (six) hours as  needed for severe pain. 15 tablet 0  . naproxen (NAPROSYN) 500 MG tablet Take 1 tablet (500 mg total) by mouth 2 (two) times daily with a meal. 60 tablet 5  . OMEGA 3 1000 MG CAPS Take 1 capsule by mouth daily.    . rosuvastatin (CRESTOR) 20 MG tablet      No current facility-administered medications on file prior to visit.    No Known Allergies  Family History  Problem Relation Age of Onset  . Colon cancer Father   . Diabetes Father   . Colon cancer Brother   . Stroke Mother   . Hyperparathyroidism Neg Hx     BP (!) 130/110 (BP Location: Right Arm, Patient Position: Sitting, Cuff Size: Normal)   Pulse 72   Ht 5\' 7"  (1.702 m)   Wt 177 lb (80.3 kg)   SpO2 97%   BMI 27.72 kg/m    Review of Systems Denies  falls.     Objective:   Physical Exam VITAL SIGNS:  See vs page GENERAL: no distress GAIT: normal and steady.    25-OH Vit-D=51     Assessment & Plan:  Vit-D def: well-controlled.  Please continue the same Vit-D supplement Primary hyperparathyroidism: recheck today

## 2021-03-21 LAB — PTH, INTACT AND CALCIUM
Calcium: 10.7 mg/dL — ABNORMAL HIGH (ref 8.6–10.4)
PTH: 86 pg/mL — ABNORMAL HIGH (ref 16–77)

## 2021-04-01 DIAGNOSIS — E119 Type 2 diabetes mellitus without complications: Secondary | ICD-10-CM | POA: Diagnosis not present

## 2021-04-01 DIAGNOSIS — Z961 Presence of intraocular lens: Secondary | ICD-10-CM | POA: Diagnosis not present

## 2021-04-01 DIAGNOSIS — H524 Presbyopia: Secondary | ICD-10-CM | POA: Diagnosis not present

## 2021-04-01 DIAGNOSIS — H25812 Combined forms of age-related cataract, left eye: Secondary | ICD-10-CM | POA: Diagnosis not present

## 2021-04-01 DIAGNOSIS — H5212 Myopia, left eye: Secondary | ICD-10-CM | POA: Diagnosis not present

## 2021-04-01 DIAGNOSIS — H52223 Regular astigmatism, bilateral: Secondary | ICD-10-CM | POA: Diagnosis not present

## 2021-04-01 DIAGNOSIS — H31092 Other chorioretinal scars, left eye: Secondary | ICD-10-CM | POA: Diagnosis not present

## 2021-04-15 ENCOUNTER — Other Ambulatory Visit: Payer: Self-pay | Admitting: Family Medicine

## 2021-04-15 DIAGNOSIS — Z139 Encounter for screening, unspecified: Secondary | ICD-10-CM

## 2021-04-29 ENCOUNTER — Other Ambulatory Visit: Payer: Self-pay

## 2021-04-29 ENCOUNTER — Ambulatory Visit
Admission: RE | Admit: 2021-04-29 | Discharge: 2021-04-29 | Disposition: A | Discharge status: Home / Self Care | Payer: Medicare HMO | Source: Ambulatory Visit | Attending: Family Medicine | Admitting: Family Medicine

## 2021-04-29 DIAGNOSIS — Z1231 Encounter for screening mammogram for malignant neoplasm of breast: Secondary | ICD-10-CM | POA: Diagnosis not present

## 2021-04-29 DIAGNOSIS — Z139 Encounter for screening, unspecified: Secondary | ICD-10-CM

## 2021-06-19 DIAGNOSIS — E1169 Type 2 diabetes mellitus with other specified complication: Secondary | ICD-10-CM | POA: Diagnosis not present

## 2021-06-19 DIAGNOSIS — I10 Essential (primary) hypertension: Secondary | ICD-10-CM | POA: Diagnosis not present

## 2021-06-19 DIAGNOSIS — Z6827 Body mass index (BMI) 27.0-27.9, adult: Secondary | ICD-10-CM | POA: Diagnosis not present

## 2021-06-19 DIAGNOSIS — E663 Overweight: Secondary | ICD-10-CM | POA: Diagnosis not present

## 2021-06-19 DIAGNOSIS — E782 Mixed hyperlipidemia: Secondary | ICD-10-CM | POA: Diagnosis not present

## 2021-09-23 ENCOUNTER — Ambulatory Visit (INDEPENDENT_AMBULATORY_CARE_PROVIDER_SITE_OTHER): Payer: Medicare HMO | Admitting: Endocrinology

## 2021-09-23 ENCOUNTER — Other Ambulatory Visit: Payer: Self-pay

## 2021-09-23 VITALS — BP 140/80 | HR 72 | Ht 67.0 in | Wt 178.4 lb

## 2021-09-23 DIAGNOSIS — E213 Hyperparathyroidism, unspecified: Secondary | ICD-10-CM

## 2021-09-23 LAB — VITAMIN D 25 HYDROXY (VIT D DEFICIENCY, FRACTURES): VITD: 51.55 ng/mL (ref 30.00–100.00)

## 2021-09-23 NOTE — Patient Instructions (Addendum)
Blood tests are requested for you today.  We'll let you know about the results.  If these results are the same, we'll continue to follow this.  Please come back for a follow-up appointment in 6 months.

## 2021-09-23 NOTE — Progress Notes (Signed)
Subjective:    Patient ID: Katelyn [name], female    DOB: 01/26/1951, 70 y.o.   MRN: [id number]  HPI Pt returns for f/u of mild hypercalcemia with intermitt hyperparathyroidism (dx'ed 2011; she saw surg then, but pt says she declined surgery; she has never had bony fracture; DEXA showed osteopenia in 2022; PTH and Ca++ are borderline high with Vit-D supplementation; she has never had urolithiasis; other w/u of hypercalcemia were neg).   Vit-D deficiency: she takes 2000 units/d.  pt states she feels well in general.   Past Medical History:  Diagnosis Date   Acute meniscal tear of knee    RIGHT   Arthritis    BACK   Diabetes mellitus, type 2 ([place])    Hypercholesteremia    Hypertension     Past Surgical History:  Procedure Laterality Date   COLONOSCOPY     COLONOSCOPY N/A 12/01/2017   Procedure: COLONOSCOPY;  Surgeon: Daneil Dolin, MD;  [facility];  Service: Endoscopy;  Laterality: N/A;  10:45   KNEE ARTHROSCOPY WITH LATERAL MENISECTOMY Right 02/16/2013   Procedure: RIGHT KNEE  ARTHROSCOPY WITH PARTIAL LATERAL MENISECTOMY; Excision of plica with shaving of patella,  partial medial menisectomy;  Surgeon: Magnus Sinning, MD;  Location: [facility];  Service: Orthopedics;  Laterality: Right;   POLYPECTOMY  12/01/2017   Procedure: POLYPECTOMY;  Surgeon: Daneil Dolin, MD;  [facility];  Service: Endoscopy;;    Social History   Socioeconomic History   Marital status: Divorced    Spouse name: Not on file   Number of children: Not on file   Years of education: Not on file   Highest education level: Not on file  Occupational History   Not on file  Tobacco Use   Smoking status: Never   Smokeless tobacco: Never  Vaping Use   Vaping Use: Never used  Substance and Sexual Activity   Alcohol use: No   Drug use: No   Sexual activity: Not on file  Other Topics Concern   Not on file  Social History Narrative   Not on file   Social  Determinants of Health   Financial Resource Strain: Not on file  Food Insecurity: Not on file  Transportation Needs: Not on file  Physical Activity: Not on file  Stress: Not on file  Social Connections: Not on file  Intimate Partner Violence: Not on file    Current Outpatient Medications on File Prior to Visit  Medication Sig Dispense Refill   amLODipine (NORVASC) 10 MG tablet Take 10 mg by mouth every morning.      amLODipine-benazepril (LOTREL) 10-20 MG capsule Take 1 capsule by mouth daily.     aspirin EC 81 MG tablet Take 81 mg by mouth daily.     atorvastatin (LIPITOR) 20 MG tablet Take 20 mg by mouth daily.     cholecalciferol (VITAMIN D) 1000 units tablet Take 1,000 Units by mouth daily.     furosemide (LASIX) 20 MG tablet TAKE 1 TABLET EVERY DAY 90 tablet 1   HYDROcodone-acetaminophen (NORCO/VICODIN) 5-325 MG tablet One tablet every six hours for pain.  Limit 7 days. 28 tablet 0   lisinopril (PRINIVIL,ZESTRIL) 20 MG tablet Take 20 mg by mouth daily.     metFORMIN (GLUCOPHAGE) 1000 MG tablet Take 1,000 mg by mouth 2 (two) times daily.      morphine (MSIR) 15 MG tablet Take 0.5 tablets (7.5 mg total) by mouth every 6 (six) hours  as needed for severe pain. 15 tablet 0   naproxen (NAPROSYN) 500 MG tablet Take 1 tablet (500 mg total) by mouth 2 (two) times daily with a meal. 60 tablet 5   OMEGA 3 1000 MG CAPS Take 1 capsule by mouth daily.     rosuvastatin (CRESTOR) 20 MG tablet      No current facility-administered medications on file prior to visit.    No Known Allergies  Family History  Problem Relation Age of Onset   Colon cancer Father    Diabetes Father    Colon cancer Brother    Stroke Mother    Hyperparathyroidism Neg Hx     BP 140/80   Pulse 72   Ht 5\' 7"  (1.702 m)   Wt 178 lb 6.4 oz (80.9 kg)   SpO2 96%   BMI 27.94 kg/m    Review of Systems Denies numbness and muscle cramps.      Objective:   Physical Exam   25-OH Vit-D=52    Assessment & Plan:   Vit-D def: well-controlled.  Please continue the same cholecalciferol Hypercalcemia: uncontrolled.  Check 24HR urine

## 2021-09-25 DIAGNOSIS — I10 Essential (primary) hypertension: Secondary | ICD-10-CM | POA: Diagnosis not present

## 2021-09-25 DIAGNOSIS — Z23 Encounter for immunization: Secondary | ICD-10-CM | POA: Diagnosis not present

## 2021-09-25 DIAGNOSIS — E118 Type 2 diabetes mellitus with unspecified complications: Secondary | ICD-10-CM | POA: Diagnosis not present

## 2021-09-25 DIAGNOSIS — E6609 Other obesity due to excess calories: Secondary | ICD-10-CM | POA: Diagnosis not present

## 2021-09-25 DIAGNOSIS — E782 Mixed hyperlipidemia: Secondary | ICD-10-CM | POA: Diagnosis not present

## 2021-09-25 DIAGNOSIS — E213 Hyperparathyroidism, unspecified: Secondary | ICD-10-CM | POA: Diagnosis not present

## 2021-09-25 DIAGNOSIS — Z6828 Body mass index (BMI) 28.0-28.9, adult: Secondary | ICD-10-CM | POA: Diagnosis not present

## 2021-09-25 DIAGNOSIS — E1165 Type 2 diabetes mellitus with hyperglycemia: Secondary | ICD-10-CM | POA: Diagnosis not present

## 2021-09-25 LAB — PTH, INTACT AND CALCIUM
Calcium: 11 mg/dL — ABNORMAL HIGH (ref 8.6–10.4)
PTH: 77 pg/mL (ref 16–77)

## 2021-09-25 LAB — ALKALINE PHOSPHATASE, BONE SPECIFIC: ALKALINE PHOSPHATASE, BONE SPECIFIC: 16.9 mcg/L (ref 5.6–29.0)

## 2021-09-25 LAB — CALCIUM, IONIZED: Calcium, Ion: 6.24 mg/dL — ABNORMAL HIGH (ref 4.8–5.6)

## 2021-11-13 ENCOUNTER — Telehealth: Payer: Self-pay | Admitting: Endocrinology

## 2021-11-13 ENCOUNTER — Other Ambulatory Visit: Payer: Self-pay | Admitting: Endocrinology

## 2021-11-13 NOTE — Telephone Encounter (Signed)
Called Patient to reschedule June appointment. Patient stated over a month ago Patient had requested the following through the after hours service, however, Patient states she never back from office. Patient requests the following:  Patient requests Lab Order be sent/released to [facility] in [place], [place]

## 2021-11-14 NOTE — Telephone Encounter (Signed)
Message sent thru MyChart 

## 2021-12-25 DIAGNOSIS — N3941 Urge incontinence: Secondary | ICD-10-CM | POA: Diagnosis not present

## 2021-12-25 DIAGNOSIS — Z1331 Encounter for screening for depression: Secondary | ICD-10-CM | POA: Diagnosis not present

## 2021-12-25 DIAGNOSIS — I10 Essential (primary) hypertension: Secondary | ICD-10-CM | POA: Diagnosis not present

## 2021-12-25 DIAGNOSIS — Z0001 Encounter for general adult medical examination with abnormal findings: Secondary | ICD-10-CM | POA: Diagnosis not present

## 2021-12-25 DIAGNOSIS — E6609 Other obesity due to excess calories: Secondary | ICD-10-CM | POA: Diagnosis not present

## 2021-12-25 DIAGNOSIS — Z6829 Body mass index (BMI) 29.0-29.9, adult: Secondary | ICD-10-CM | POA: Diagnosis not present

## 2021-12-25 DIAGNOSIS — B351 Tinea unguium: Secondary | ICD-10-CM | POA: Diagnosis not present

## 2021-12-25 DIAGNOSIS — E118 Type 2 diabetes mellitus with unspecified complications: Secondary | ICD-10-CM | POA: Diagnosis not present

## 2021-12-25 DIAGNOSIS — E7849 Other hyperlipidemia: Secondary | ICD-10-CM | POA: Diagnosis not present

## 2021-12-25 DIAGNOSIS — N8110 Cystocele, unspecified: Secondary | ICD-10-CM | POA: Diagnosis not present

## 2022-01-15 DIAGNOSIS — H25812 Combined forms of age-related cataract, left eye: Secondary | ICD-10-CM | POA: Diagnosis not present

## 2022-01-15 DIAGNOSIS — H26491 Other secondary cataract, right eye: Secondary | ICD-10-CM | POA: Diagnosis not present

## 2022-01-20 ENCOUNTER — Ambulatory Visit: Payer: Medicare HMO | Admitting: Endocrinology

## 2022-01-20 ENCOUNTER — Encounter: Payer: Self-pay | Admitting: Endocrinology

## 2022-01-20 VITALS — BP 158/90 | HR 69 | Ht 67.0 in | Wt 184.8 lb

## 2022-01-20 DIAGNOSIS — E559 Vitamin D deficiency, unspecified: Secondary | ICD-10-CM | POA: Diagnosis not present

## 2022-01-20 DIAGNOSIS — E210 Primary hyperparathyroidism: Secondary | ICD-10-CM | POA: Diagnosis not present

## 2022-01-20 LAB — VITAMIN D 25 HYDROXY (VIT D DEFICIENCY, FRACTURES): VITD: 69.07 ng/mL (ref 30.00–100.00)

## 2022-01-20 NOTE — Patient Instructions (Addendum)
Blood tests are requested for you today.  We'll let you know about the results.   ?If these results are about the same, we'll continue to follow this.   ?Please come back for a follow-up appointment in 6 months.   ? ?

## 2022-01-20 NOTE — Progress Notes (Signed)
? ?Subjective:  ? ? Patient ID: Katelyn [name], female    DOB: 12-Jan-1951, 71 y.o.   MRN: [id number] ? ?HPI ?Pt returns for f/u of mild hypercalcemia with intermitt hyperparathyroidism (dx'ed 2011; she saw surg then, but pt says she declined surgery; she has never had bony fracture; DEXA showed osteopenia in 2022; PTH and Ca++ are borderline high with Vit-D supplementation; she has never had urolithiasis; other w/u of hypercalcemia were neg, except 24HR urine Ca++ (was ordered twice, but pt did not collect)).  Vit-D deficiency: she takes unknown dosage.  pt states she feels well in general.   ?Past Medical History:  ?Diagnosis Date  ? Acute meniscal tear of knee   ? RIGHT  ? Arthritis   ? BACK  ? Diabetes mellitus, type 2 ([place])   ? Hypercholesteremia   ? Hypertension   ? ? ?Past Surgical History:  ?Procedure Laterality Date  ? COLONOSCOPY    ? COLONOSCOPY N/A 12/01/2017  ? Procedure: COLONOSCOPY;  Surgeon: Daneil Dolin, MD;  Location: [facility];  Service: Endoscopy;  Laterality: N/A;  10:45  ? KNEE ARTHROSCOPY WITH LATERAL MENISECTOMY Right 02/16/2013  ? Procedure: RIGHT KNEE  ARTHROSCOPY WITH PARTIAL LATERAL MENISECTOMY; Excision of plica with shaving of patella,  partial medial menisectomy;  Surgeon: Magnus Sinning, MD;  Location: [place];  Service: Orthopedics;  Laterality: Right;  ? POLYPECTOMY  12/01/2017  ? Procedure: POLYPECTOMY;  Surgeon: Daneil Dolin, MD;  Location: [facility];  Service: Endoscopy;;  ? ? ?Social History  ? ?Socioeconomic History  ? Marital status: Divorced  ?  Spouse name: Not on file  ? Number of children: Not on file  ? Years of education: Not on file  ? Highest education level: Not on file  ?Occupational History  ? Not on file  ?Tobacco Use  ? Smoking status: Never  ? Smokeless tobacco: Never  ?Vaping Use  ? Vaping Use: Never used  ?Substance and Sexual Activity  ? Alcohol use: No  ? Drug use: No  ? Sexual activity: Not on file  ?Other Topics Concern  ?  Not on file  ?Social History Narrative  ? Not on file  ? ?Social Determinants of Health  ? ?Financial Resource Strain: Not on file  ?Food Insecurity: Not on file  ?Transportation Needs: Not on file  ?Physical Activity: Not on file  ?Stress: Not on file  ?Social Connections: Not on file  ?Intimate Partner Violence: Not on file  ? ? ?Current Outpatient Medications on File Prior to Visit  ?Medication Sig Dispense Refill  ? amLODipine (NORVASC) 10 MG tablet Take 10 mg by mouth every morning.     ? amLODipine-benazepril (LOTREL) 10-20 MG capsule Take 1 capsule by mouth daily.    ? aspirin EC 81 MG tablet Take 81 mg by mouth daily.    ? atorvastatin (LIPITOR) 20 MG tablet Take 20 mg by mouth daily.    ? cholecalciferol (VITAMIN D) 1000 units tablet Take 1,000 Units by mouth daily.    ? furosemide (LASIX) 20 MG tablet TAKE 1 TABLET EVERY DAY 90 tablet 1  ? HYDROcodone-acetaminophen (NORCO/VICODIN) 5-325 MG tablet One tablet every six hours for pain.  Limit 7 days. 28 tablet 0  ? lisinopril (PRINIVIL,ZESTRIL) 20 MG tablet Take 20 mg by mouth daily.    ? metFORMIN (GLUCOPHAGE) 1000 MG tablet Take 1,000 mg by mouth 2 (two) times daily.     ? morphine (MSIR) 15 MG tablet Take  0.5 tablets (7.5 mg total) by mouth every 6 (six) hours as needed for severe pain. 15 tablet 0  ? naproxen (NAPROSYN) 500 MG tablet Take 1 tablet (500 mg total) by mouth 2 (two) times daily with a meal. 60 tablet 5  ? OMEGA 3 1000 MG CAPS Take 1 capsule by mouth daily.    ? rosuvastatin (CRESTOR) 20 MG tablet     ? ?No current facility-administered medications on file prior to visit.  ? ? ?No Known Allergies ? ?Family History  ?Problem Relation Age of Onset  ? Colon cancer Father   ? Diabetes Father   ? Colon cancer Brother   ? Stroke Mother   ? Hyperparathyroidism Neg Hx   ? ? ?BP (!) 158/90 (BP Location: Left Arm, Patient Position: Sitting, Cuff Size: Normal)   Pulse 69   Ht '5\' 7"'$  (1.702 m)   Wt 184 lb 12.8 oz (83.8 kg)   SpO2 98%   BMI 28.94 kg/m?   ? ? ?Review of Systems ?Denies numbness and muscle cramps.   ?   ?Objective:  ? Physical Exam ?VITAL SIGNS:  See vs page.   ?GENERAL: no distress.   ?GAIT: normal and steady.   ? ? ?25-OH Vit-D=69 ?   ?Assessment & Plan:  ?Vit-D def: well-controlled. Please continue the same Vit-D supplement.   ?Primary hyperparathyroidism: recheck today ?

## 2022-01-21 LAB — PTH, INTACT AND CALCIUM
Calcium: 10.9 mg/dL — ABNORMAL HIGH (ref 8.6–10.4)
PTH: 91 pg/mL — ABNORMAL HIGH (ref 16–77)

## 2022-03-18 ENCOUNTER — Other Ambulatory Visit: Payer: Self-pay | Admitting: Family Medicine

## 2022-03-18 DIAGNOSIS — Z1231 Encounter for screening mammogram for malignant neoplasm of breast: Secondary | ICD-10-CM

## 2022-03-24 ENCOUNTER — Ambulatory Visit: Payer: Medicare HMO | Admitting: Endocrinology

## 2022-03-26 DIAGNOSIS — Z6828 Body mass index (BMI) 28.0-28.9, adult: Secondary | ICD-10-CM | POA: Diagnosis not present

## 2022-03-26 DIAGNOSIS — E213 Hyperparathyroidism, unspecified: Secondary | ICD-10-CM | POA: Diagnosis not present

## 2022-03-26 DIAGNOSIS — N181 Chronic kidney disease, stage 1: Secondary | ICD-10-CM | POA: Diagnosis not present

## 2022-03-26 DIAGNOSIS — E663 Overweight: Secondary | ICD-10-CM | POA: Diagnosis not present

## 2022-03-26 DIAGNOSIS — I10 Essential (primary) hypertension: Secondary | ICD-10-CM | POA: Diagnosis not present

## 2022-03-26 DIAGNOSIS — E782 Mixed hyperlipidemia: Secondary | ICD-10-CM | POA: Diagnosis not present

## 2022-03-26 DIAGNOSIS — E1122 Type 2 diabetes mellitus with diabetic chronic kidney disease: Secondary | ICD-10-CM | POA: Diagnosis not present

## 2022-04-15 ENCOUNTER — Other Ambulatory Visit: Payer: Self-pay

## 2022-04-15 ENCOUNTER — Emergency Department (HOSPITAL_COMMUNITY): Payer: Medicare HMO

## 2022-04-15 ENCOUNTER — Inpatient Hospital Stay (HOSPITAL_COMMUNITY)
Admission: EM | Admit: 2022-04-15 | Discharge: 2022-04-17 | DRG: 872 | Disposition: A | Discharge status: Home / Self Care | Payer: Medicare HMO | Attending: Family Medicine | Admitting: Family Medicine

## 2022-04-15 ENCOUNTER — Encounter (HOSPITAL_COMMUNITY): Payer: Self-pay | Admitting: *Deleted

## 2022-04-15 DIAGNOSIS — E1165 Type 2 diabetes mellitus with hyperglycemia: Secondary | ICD-10-CM | POA: Diagnosis not present

## 2022-04-15 DIAGNOSIS — E215 Disorder of parathyroid gland, unspecified: Secondary | ICD-10-CM | POA: Diagnosis present

## 2022-04-15 DIAGNOSIS — Z20822 Contact with and (suspected) exposure to covid-19: Secondary | ICD-10-CM | POA: Diagnosis present

## 2022-04-15 DIAGNOSIS — Z7984 Long term (current) use of oral hypoglycemic drugs: Secondary | ICD-10-CM

## 2022-04-15 DIAGNOSIS — K449 Diaphragmatic hernia without obstruction or gangrene: Secondary | ICD-10-CM | POA: Diagnosis not present

## 2022-04-15 DIAGNOSIS — I10 Essential (primary) hypertension: Secondary | ICD-10-CM | POA: Diagnosis present

## 2022-04-15 DIAGNOSIS — A4151 Sepsis due to Escherichia coli [E. coli]: Secondary | ICD-10-CM | POA: Diagnosis not present

## 2022-04-15 DIAGNOSIS — D259 Leiomyoma of uterus, unspecified: Secondary | ICD-10-CM | POA: Diagnosis not present

## 2022-04-15 DIAGNOSIS — E871 Hypo-osmolality and hyponatremia: Secondary | ICD-10-CM | POA: Diagnosis present

## 2022-04-15 DIAGNOSIS — Z79899 Other long term (current) drug therapy: Secondary | ICD-10-CM

## 2022-04-15 DIAGNOSIS — E860 Dehydration: Secondary | ICD-10-CM | POA: Diagnosis not present

## 2022-04-15 DIAGNOSIS — N179 Acute kidney failure, unspecified: Secondary | ICD-10-CM | POA: Diagnosis present

## 2022-04-15 DIAGNOSIS — K760 Fatty (change of) liver, not elsewhere classified: Secondary | ICD-10-CM | POA: Diagnosis not present

## 2022-04-15 DIAGNOSIS — E7800 Pure hypercholesterolemia, unspecified: Secondary | ICD-10-CM | POA: Diagnosis present

## 2022-04-15 DIAGNOSIS — R9431 Abnormal electrocardiogram [ECG] [EKG]: Secondary | ICD-10-CM | POA: Diagnosis not present

## 2022-04-15 DIAGNOSIS — M479 Spondylosis, unspecified: Secondary | ICD-10-CM | POA: Diagnosis present

## 2022-04-15 DIAGNOSIS — K7581 Nonalcoholic steatohepatitis (NASH): Secondary | ICD-10-CM | POA: Diagnosis present

## 2022-04-15 DIAGNOSIS — N134 Hydroureter: Secondary | ICD-10-CM | POA: Diagnosis not present

## 2022-04-15 DIAGNOSIS — N1330 Unspecified hydronephrosis: Secondary | ICD-10-CM | POA: Diagnosis not present

## 2022-04-15 DIAGNOSIS — E876 Hypokalemia: Secondary | ICD-10-CM | POA: Diagnosis present

## 2022-04-15 DIAGNOSIS — N12 Tubulo-interstitial nephritis, not specified as acute or chronic: Secondary | ICD-10-CM | POA: Diagnosis present

## 2022-04-15 DIAGNOSIS — Z833 Family history of diabetes mellitus: Secondary | ICD-10-CM

## 2022-04-15 DIAGNOSIS — Z7982 Long term (current) use of aspirin: Secondary | ICD-10-CM | POA: Diagnosis not present

## 2022-04-15 DIAGNOSIS — A419 Sepsis, unspecified organism: Secondary | ICD-10-CM | POA: Diagnosis present

## 2022-04-15 HISTORY — DX: Disorder of parathyroid gland, unspecified: E21.5

## 2022-04-15 LAB — URINALYSIS, ROUTINE W REFLEX MICROSCOPIC
Bacteria, UA: NONE SEEN
Bilirubin Urine: NEGATIVE
Glucose, UA: >=500 mg/dL — AB
Ketones, ur: NEGATIVE mg/dL
Nitrite: NEGATIVE
Protein, ur: 30 mg/dL — AB
Specific Gravity, Urine: 1.011 (ref 1.005–1.030)
pH: 7 (ref 5.0–8.0)

## 2022-04-15 LAB — CBC WITH DIFFERENTIAL/PLATELET
Abs Immature Granulocytes: 0.04 10*3/uL (ref 0.00–0.07)
Basophils Absolute: 0 10*3/uL (ref 0.0–0.1)
Basophils Relative: 0 %
Eosinophils Absolute: 0 10*3/uL (ref 0.0–0.5)
Eosinophils Relative: 0 %
HCT: 36.4 % (ref 36.0–46.0)
Hemoglobin: 12 g/dL (ref 12.0–15.0)
Immature Granulocytes: 0 %
Lymphocytes Relative: 7 %
Lymphs Abs: 0.9 10*3/uL (ref 0.7–4.0)
MCH: 28.3 pg (ref 26.0–34.0)
MCHC: 33 g/dL (ref 30.0–36.0)
MCV: 85.8 fL (ref 80.0–100.0)
Monocytes Absolute: 0.8 10*3/uL (ref 0.1–1.0)
Monocytes Relative: 6 %
Neutro Abs: 10.7 10*3/uL — ABNORMAL HIGH (ref 1.7–7.7)
Neutrophils Relative %: 87 %
Platelets: 226 10*3/uL (ref 150–400)
RBC: 4.24 MIL/uL (ref 3.87–5.11)
RDW: 14.2 % (ref 11.5–15.5)
WBC: 12.4 10*3/uL — ABNORMAL HIGH (ref 4.0–10.5)
nRBC: 0 % (ref 0.0–0.2)

## 2022-04-15 LAB — GLUCOSE, CAPILLARY
Glucose-Capillary: 286 mg/dL — ABNORMAL HIGH (ref 70–99)
Glucose-Capillary: 220 mg/dL — ABNORMAL HIGH (ref 70–99)

## 2022-04-15 LAB — COMPREHENSIVE METABOLIC PANEL
ALT: 31 U/L (ref 0–44)
AST: 31 U/L (ref 15–41)
Albumin: 3.2 g/dL — ABNORMAL LOW (ref 3.5–5.0)
Alkaline Phosphatase: 100 U/L (ref 38–126)
Anion gap: 10 (ref 5–15)
BUN: 20 mg/dL (ref 8–23)
CO2: 21 mmol/L — ABNORMAL LOW (ref 22–32)
Calcium: 9.8 mg/dL (ref 8.9–10.3)
Chloride: 102 mmol/L (ref 98–111)
Creatinine, Ser: 1.41 mg/dL — ABNORMAL HIGH (ref 0.44–1.00)
GFR, Estimated: 40 mL/min — ABNORMAL LOW (ref >60)
Glucose, Bld: 255 mg/dL — ABNORMAL HIGH (ref 70–99)
Potassium: 2.7 mmol/L — CL (ref 3.5–5.1)
Sodium: 133 mmol/L — ABNORMAL LOW (ref 135–145)
Total Bilirubin: 0.4 mg/dL (ref 0.3–1.2)
Total Protein: 7.8 g/dL (ref 6.5–8.1)

## 2022-04-15 LAB — APTT: aPTT: 30 seconds (ref 24–36)

## 2022-04-15 LAB — PROTIME-INR
INR: 1.3 — ABNORMAL HIGH (ref 0.8–1.2)
Prothrombin Time: 15.7 seconds — ABNORMAL HIGH (ref 11.4–15.2)

## 2022-04-15 LAB — RESP PANEL BY RT-PCR (FLU A&B, COVID) ARPGX2
Influenza A by PCR: NEGATIVE
Influenza B by PCR: NEGATIVE
SARS Coronavirus 2 by RT PCR: NEGATIVE

## 2022-04-15 LAB — LACTIC ACID, PLASMA
Lactic Acid, Venous: 1.7 mmol/L (ref 0.5–1.9)
Lactic Acid, Venous: 1.1 mmol/L (ref 0.5–1.9)

## 2022-04-15 LAB — MAGNESIUM: Magnesium: 2.1 mg/dL (ref 1.7–2.4)

## 2022-04-15 LAB — CBG MONITORING, ED
Glucose-Capillary: 225 mg/dL — ABNORMAL HIGH (ref 70–99)
Glucose-Capillary: 196 mg/dL — ABNORMAL HIGH (ref 70–99)

## 2022-04-15 MED ORDER — SODIUM CHLORIDE 0.9 % IV SOLN
2.0000 g | Freq: Once | INTRAVENOUS | Status: AC
  Administered 2022-04-15: 2 g via INTRAVENOUS
  Filled 2022-04-15: qty 20

## 2022-04-15 MED ORDER — ROSUVASTATIN CALCIUM 20 MG PO TABS
20.0000 mg | ORAL_TABLET | Freq: Every day | ORAL | Status: DC
  Administered 2022-04-15 – 2022-04-16 (×2): 20 mg via ORAL
  Filled 2022-04-15 (×2): qty 1

## 2022-04-15 MED ORDER — POTASSIUM CHLORIDE 10 MEQ/100ML IV SOLN
10.0000 meq | Freq: Once | INTRAVENOUS | Status: AC
  Administered 2022-04-15: 10 meq via INTRAVENOUS
  Filled 2022-04-15: qty 100

## 2022-04-15 MED ORDER — ACETAMINOPHEN 325 MG PO TABS
650.0000 mg | ORAL_TABLET | Freq: Once | ORAL | Status: AC
  Administered 2022-04-15: 650 mg via ORAL
  Filled 2022-04-15: qty 2

## 2022-04-15 MED ORDER — SODIUM CHLORIDE 0.9% FLUSH
3.0000 mL | Freq: Two times a day (BID) | INTRAVENOUS | Status: DC
  Administered 2022-04-15 – 2022-04-16 (×2): 3 mL via INTRAVENOUS

## 2022-04-15 MED ORDER — SODIUM CHLORIDE 0.9% FLUSH
3.0000 mL | INTRAVENOUS | Status: DC | PRN
  Administered 2022-04-16: 3 mL via INTRAVENOUS

## 2022-04-15 MED ORDER — ACETAMINOPHEN 325 MG PO TABS
650.0000 mg | ORAL_TABLET | Freq: Four times a day (QID) | ORAL | Status: DC | PRN
  Administered 2022-04-16: 650 mg via ORAL
  Filled 2022-04-15: qty 2

## 2022-04-15 MED ORDER — ASPIRIN 81 MG PO TBEC
81.0000 mg | DELAYED_RELEASE_TABLET | Freq: Every day | ORAL | Status: DC
  Administered 2022-04-15 – 2022-04-17 (×3): 81 mg via ORAL
  Filled 2022-04-15 (×3): qty 1

## 2022-04-15 MED ORDER — FENTANYL CITRATE PF 50 MCG/ML IJ SOSY
50.0000 ug | PREFILLED_SYRINGE | INTRAMUSCULAR | Status: DC | PRN
  Administered 2022-04-15: 50 ug via INTRAVENOUS
  Filled 2022-04-15: qty 1

## 2022-04-15 MED ORDER — ACETAMINOPHEN 650 MG RE SUPP: 650.0000 mg | Freq: Four times a day (QID) | RECTAL | Status: DC | PRN

## 2022-04-15 MED ORDER — SODIUM CHLORIDE 0.9 % IV SOLN
2.0000 g | INTRAVENOUS | Status: DC
  Administered 2022-04-16 – 2022-04-17 (×2): 2 g via INTRAVENOUS
  Filled 2022-04-15 (×2): qty 20

## 2022-04-15 MED ORDER — METOPROLOL SUCCINATE ER 50 MG PO TB24
50.0000 mg | ORAL_TABLET | Freq: Every day | ORAL | Status: DC
  Administered 2022-04-15 – 2022-04-17 (×3): 50 mg via ORAL
  Filled 2022-04-15 (×3): qty 1

## 2022-04-15 MED ORDER — TRAZODONE HCL 50 MG PO TABS: 50.0000 mg | ORAL_TABLET | Freq: Every evening | ORAL | Status: DC | PRN

## 2022-04-15 MED ORDER — INSULIN ASPART 100 UNIT/ML IJ SOLN
0.0000 [IU] | Freq: Three times a day (TID) | INTRAMUSCULAR | Status: DC
  Administered 2022-04-15: 3 [IU] via SUBCUTANEOUS
  Administered 2022-04-15: 8 [IU] via SUBCUTANEOUS
  Administered 2022-04-16 (×2): 3 [IU] via SUBCUTANEOUS
  Administered 2022-04-16: 5 [IU] via SUBCUTANEOUS
  Administered 2022-04-17: 2 [IU] via SUBCUTANEOUS
  Filled 2022-04-15: qty 1

## 2022-04-15 MED ORDER — INSULIN ASPART 100 UNIT/ML IJ SOLN
0.0000 [IU] | Freq: Every day | INTRAMUSCULAR | Status: DC
  Administered 2022-04-15: 2 [IU] via SUBCUTANEOUS

## 2022-04-15 MED ORDER — ORAL CARE MOUTH RINSE: 15.0000 mL | OROMUCOSAL | Status: DC | PRN

## 2022-04-15 MED ORDER — POLYETHYLENE GLYCOL 3350 17 G PO PACK
17.0000 g | PACK | Freq: Every day | ORAL | Status: DC | PRN
  Administered 2022-04-16: 17 g via ORAL
  Filled 2022-04-15: qty 1

## 2022-04-15 MED ORDER — HEPARIN SODIUM (PORCINE) 5000 UNIT/ML IJ SOLN
5000.0000 [IU] | Freq: Three times a day (TID) | INTRAMUSCULAR | Status: DC
  Administered 2022-04-15 – 2022-04-16 (×3): 5000 [IU] via SUBCUTANEOUS
  Filled 2022-04-15 (×5): qty 1

## 2022-04-15 MED ORDER — SODIUM CHLORIDE 0.9 % IV SOLN
INTRAVENOUS | Status: DC | PRN
Start: 2022-04-15 — End: 2022-04-17

## 2022-04-15 MED ORDER — BISACODYL 10 MG RE SUPP
10.0000 mg | Freq: Every day | RECTAL | Status: DC | PRN
  Administered 2022-04-16: 10 mg via RECTAL
  Filled 2022-04-15: qty 1

## 2022-04-15 MED ORDER — SODIUM CHLORIDE 0.9 % IV BOLUS
2000.0000 mL | Freq: Once | INTRAVENOUS | Status: AC
  Administered 2022-04-15: 2000 mL via INTRAVENOUS

## 2022-04-15 MED ORDER — SODIUM CHLORIDE 0.9% FLUSH
3.0000 mL | Freq: Two times a day (BID) | INTRAVENOUS | Status: DC
  Administered 2022-04-15 – 2022-04-16 (×3): 3 mL via INTRAVENOUS

## 2022-04-15 MED ORDER — AMLODIPINE BESYLATE 5 MG PO TABS
10.0000 mg | ORAL_TABLET | Freq: Every day | ORAL | Status: DC
Start: 2022-04-15 — End: 2022-04-17
  Administered 2022-04-15 – 2022-04-17 (×3): 10 mg via ORAL
  Filled 2022-04-15 (×3): qty 2

## 2022-04-15 MED ORDER — SODIUM CHLORIDE 0.9 % IV SOLN: INTRAVENOUS | Status: AC

## 2022-04-15 MED ORDER — POTASSIUM CHLORIDE CRYS ER 20 MEQ PO TBCR
40.0000 meq | EXTENDED_RELEASE_TABLET | Freq: Once | ORAL | Status: AC
  Administered 2022-04-15: 40 meq via ORAL
  Filled 2022-04-15: qty 2

## 2022-04-15 MED ORDER — ONDANSETRON HCL 4 MG/2ML IJ SOLN: 4.0000 mg | Freq: Four times a day (QID) | INTRAMUSCULAR | Status: DC | PRN

## 2022-04-15 MED ORDER — FENTANYL CITRATE PF 50 MCG/ML IJ SOSY
50.0000 ug | PREFILLED_SYRINGE | INTRAMUSCULAR | Status: DC | PRN
  Administered 2022-04-15 – 2022-04-16 (×3): 50 ug via INTRAVENOUS
  Filled 2022-04-15 (×3): qty 1

## 2022-04-15 MED ORDER — HYDRALAZINE HCL 20 MG/ML IJ SOLN: 10.0000 mg | Freq: Four times a day (QID) | INTRAMUSCULAR | Status: DC | PRN

## 2022-04-15 NOTE — ED Provider Notes (Signed)
[facility] EMERGENCY DEPARTMENT Provider Note   CSN: [id number] Arrival date & time: 04/15/22  0820     History  Chief Complaint  Patient presents with   Fever    Katelyn [name] is a 71 y.o. female.  Patient with diabetes hypertension.  She complains of fever and right flank pain  The history is provided by the patient and medical records. No language interpreter was used.  Fever Temp source:  Oral Severity:  Moderate Onset quality:  Sudden Timing:  Constant Progression:  Worsening Chronicity:  New Relieved by:  Nothing Worsened by:  Nothing Ineffective treatments:  None tried Associated symptoms: no chest pain, no congestion, no cough, no diarrhea, no headaches and no rash        Home Medications Prior to Admission medications   Medication Sig Start Date End Date Taking? Authorizing Provider  amLODipine-benazepril (LOTREL) 10-40 MG capsule Take 1 capsule by mouth daily. 03/03/22  Yes [provider]  aspirin EC 81 MG tablet Take 81 mg by mouth daily.   Yes [provider]  cholecalciferol (VITAMIN D) 1000 units tablet Take 1,000 Units by mouth daily.   Yes [provider]  furosemide (LASIX) 20 MG tablet TAKE 1 TABLET EVERY DAY Patient taking differently: Take 20 mg by mouth daily. 11/07/18  Yes Renato Shin, MD  glipiZIDE (GLUCOTROL XL) 10 MG 24 hr tablet Take 10 mg by mouth daily with breakfast. 03/03/22  Yes [provider]  JARDIANCE 25 MG TABS tablet Take 25 mg by mouth daily. 03/20/22  Yes [provider]  lisinopril (PRINIVIL,ZESTRIL) 20 MG tablet Take 20 mg by mouth daily.   Yes [provider]  metFORMIN (GLUCOPHAGE) 1000 MG tablet Take 1,000 mg by mouth daily at 6 (six) AM.   Yes [provider]  metoprolol succinate (TOPROL-XL) 50 MG 24 hr tablet Take 50 mg by mouth daily. 03/03/22  Yes [provider]  OMEGA 3 1000 MG CAPS Take 1 capsule by mouth daily.   Yes [provider]   rosuvastatin (CRESTOR) 20 MG tablet Take 20 mg by mouth daily. 10/20/18  Yes [provider]      Allergies    Patient has no known allergies.    Review of Systems   Review of Systems  Constitutional:  Positive for fever. Negative for appetite change and fatigue.  HENT:  Negative for congestion, ear discharge and sinus pressure.   Eyes:  Negative for discharge.  Respiratory:  Negative for cough.   Cardiovascular:  Negative for chest pain.  Gastrointestinal:  Negative for abdominal pain and diarrhea.  Genitourinary:  Negative for frequency and hematuria.  Musculoskeletal:  Positive for back pain.  Skin:  Negative for rash.  Neurological:  Negative for seizures and headaches.  Psychiatric/Behavioral:  Negative for hallucinations.     Physical Exam Updated Vital Signs BP (!) 146/69   Pulse 78   Temp 100.2 F (37.9 C) (Oral)   Resp (!) 21   Ht '5\' 7"'$  (1.702 m)   Wt 84.4 kg   SpO2 98%   BMI 29.13 kg/m  Physical Exam Vitals and nursing note reviewed.  Constitutional:      Appearance: She is well-developed.  HENT:     Head: Normocephalic.     Nose: Nose normal.  Eyes:     General: No scleral icterus.    Conjunctiva/sclera: Conjunctivae normal.  Neck:     Thyroid: No thyromegaly.  Cardiovascular:     Rate and Rhythm: Normal  rate and regular rhythm.     Heart sounds: No murmur heard.    No friction rub. No gallop.  Pulmonary:     Breath sounds: No stridor. No wheezing or rales.  Chest:     Chest wall: No tenderness.  Abdominal:     General: There is no distension.     Tenderness: There is no abdominal tenderness. There is no rebound.  Musculoskeletal:        General: Normal range of motion.     Cervical back: Neck supple.     Comments: Tenderness lumbar spine  Lymphadenopathy:     Cervical: No cervical adenopathy.  Skin:    Findings: No erythema or rash.  Neurological:     Mental Status: She is alert and oriented to person, place, and time.     Motor:  No abnormal muscle tone.     Coordination: Coordination normal.  Psychiatric:        Behavior: Behavior normal.     ED Results / Procedures / Treatments   Labs (all labs ordered are listed, but only abnormal results are displayed) Labs Reviewed  COMPREHENSIVE METABOLIC PANEL - Abnormal; Notable for the following components:      Result Value   Sodium 133 (*)    Potassium 2.7 (*)    CO2 21 (*)    Glucose, Bld 255 (*)    Creatinine, Ser 1.41 (*)    Albumin 3.2 (*)    GFR, Estimated 40 (*)    All other components within normal limits  CBC WITH DIFFERENTIAL/PLATELET - Abnormal; Notable for the following components:   WBC 12.4 (*)    Neutro Abs 10.7 (*)    All other components within normal limits  PROTIME-INR - Abnormal; Notable for the following components:   Prothrombin Time 15.7 (*)    INR 1.3 (*)    All other components within normal limits  URINALYSIS, ROUTINE W REFLEX MICROSCOPIC - Abnormal; Notable for the following components:   Color, Urine STRAW (*)    Glucose, UA >=500 (*)    Hgb urine dipstick LARGE (*)    Protein, ur 30 (*)    Leukocytes,Ua SMALL (*)    All other components within normal limits  CBG MONITORING, ED - Abnormal; Notable for the following components:   Glucose-Capillary 225 (*)    All other components within normal limits  CULTURE, BLOOD (ROUTINE X 2)  CULTURE, BLOOD (ROUTINE X 2)  RESP PANEL BY RT-PCR (FLU A&B, COVID) ARPGX2  URINE CULTURE  LACTIC ACID, PLASMA  LACTIC ACID, PLASMA  APTT  HEMOGLOBIN A1C    EKG None  Radiology CT Renal Stone Study  Result Date: 04/15/2022 CLINICAL DATA:  Flank pain, kidney stone suspected. EXAM: CT ABDOMEN AND PELVIS WITHOUT CONTRAST TECHNIQUE: Multidetector CT imaging of the abdomen and pelvis was performed following the standard protocol without IV contrast. RADIATION DOSE REDUCTION: This exam was performed according to the departmental dose-optimization program which includes automated exposure control,  adjustment of the mA and/or kV according to patient size and/or use of iterative reconstruction technique. COMPARISON:  CT examination dated September 24, 2018 FINDINGS: Lower chest: Subsegmental linear atelectasis.  No acute abnormality. Hepatobiliary: No focal liver abnormality is seen. No gallstones, gallbladder wall thickening, or biliary dilatation. Pancreas: Unremarkable. No pancreatic ductal dilatation or surrounding inflammatory changes. Spleen: Normal in size without focal abnormality. Adrenals/Urinary Tract: Adrenal glands are unremarkable. There is increased echogenicity of the renal pyramids with punctate coarse calcifications suggesting chronic medullary nephrocalcinosis, unchanged.  There is mild right hydroureter and right perinephric fat stranding. There is no appreciable ureteral calculus. Multiple pelvic phleboliths. Bladder is unremarkable. Stomach/Bowel: Small hiatal hernia. Stomach is within normal limits. Appendix not clearly identified. No evidence of bowel wall thickening, distention, or inflammatory changes. Vascular/Lymphatic: Aortic atherosclerosis. No enlarged abdominal or pelvic lymph nodes. Reproductive: Uterus is present with small degenerated fibroids. Adnexa are unremarkable. Other: No abdominal wall hernia or abnormality. No abdominopelvic ascites. Musculoskeletal: No acute or significant osseous findings. IMPRESSION: 1. Echogenic renal pyramids bilaterally with punctate calcifications suggesting chronic medullary nephrocalcinosis similar to prior examination. 2. Mild right hydronephrosis and right perinephric stranding without evidence of ureteral calculus, suspicious for infectious/inflammatory process including pyelonephritis. Evaluation is limited on this noncontrast enhanced examination. Correlation with urinalysis would be helpful. 3.  Atherosclerotic disease of abdominal aorta and branch vessels. Electronically Signed   By: Keane Police D.O.   On: 04/15/2022 11:10   DG Chest  Port 1 View  Result Date: 04/15/2022 CLINICAL DATA:  71 year old female with sepsis. EXAM: PORTABLE CHEST 1 VIEW COMPARISON:  No priors. FINDINGS: Lung volumes are normal. No consolidative airspace disease. No pleural effusions. No pneumothorax. No pulmonary nodule or mass noted. Pulmonary vasculature and the cardiomediastinal silhouette are within normal limits. Atherosclerosis in the thoracic aorta. IMPRESSION: 1. No radiographic evidence of acute cardiopulmonary disease. 2. Aortic atherosclerosis. Electronically Signed   By: Vinnie Langton M.D.   On: 04/15/2022 09:00    Procedures Procedures    Medications Ordered in ED Medications  potassium chloride 10 mEq in 100 mL IVPB (10 mEq Intravenous New Bag/Given 04/15/22 1058)  potassium chloride SA (KLOR-CON M) CR tablet 40 mEq (has no administration in time range)  potassium chloride 10 mEq in 100 mL IVPB (has no administration in time range)  insulin aspart (novoLOG) injection 0-15 Units (has no administration in time range)  insulin aspart (novoLOG) injection 0-5 Units (has no administration in time range)  hydrALAZINE (APRESOLINE) injection 10 mg (has no administration in time range)  ondansetron (ZOFRAN) injection 4 mg (has no administration in time range)  cefTRIAXone (ROCEPHIN) 2 g in sodium chloride 0.9 % 100 mL IVPB (has no administration in time range)  acetaminophen (TYLENOL) tablet 650 mg (650 mg Oral Given 04/15/22 0856)  sodium chloride 0.9 % bolus 2,000 mL (0 mLs Intravenous Stopped 04/15/22 1030)  cefTRIAXone (ROCEPHIN) 2 g in sodium chloride 0.9 % 100 mL IVPB (0 g Intravenous Stopped 04/15/22 0925)  cefTRIAXone (ROCEPHIN) 2 g in sodium chloride 0.9 % 100 mL IVPB (0 g Intravenous Stopped 04/15/22 1015)  potassium chloride 10 mEq in 100 mL IVPB (0 mEq Intravenous Stopped 04/15/22 1055)    ED Course/ Medical Decision Making/ A&P  CRITICAL CARE Performed by: Milton Ferguson Total critical care time: 40 minutes Critical care time was  exclusive of separately billable procedures and treating other patients. Critical care was necessary to treat or prevent imminent or life-threatening deterioration. Critical care was time spent personally by me on the following activities: development of treatment plan with patient and/or surrogate as well as nursing, discussions with consultants, evaluation of patient's response to treatment, examination of patient, obtaining history from patient or surrogate, ordering and performing treatments and interventions, ordering and review of laboratory studies, ordering and review of radiographic studies, pulse oximetry and re-evaluation of patient's condition.                          Medical Decision Making Amount and/or Complexity  of Data Reviewed Labs: ordered. Radiology: ordered. ECG/medicine tests: ordered.  Risk OTC drugs. Prescription drug management. Decision regarding hospitalization.  This patient presents to the ED for concern of pain and fever, this involves an extensive number of treatment options, and is a complaint that carries with it a high risk of complications and morbidity.  The differential diagnosis includes kidney stone, UTI   Co morbidities that complicate the patient evaluation  Diabetes and hypertension   Additional history obtained:  Additional history obtained from patient's daughter External records from outside source obtained and reviewed including hospital record   Lab Tests:  I Ordered, and personally interpreted labs.  The pertinent results include: Chemistries show potassium 2.7 glucose 255, CBC shows white count 12.4   Imaging Studies ordered:  I ordered imaging studies including CT of renal I independently visualized and interpreted imaging which showed UTI I agree with the radiologist interpretation   Cardiac Monitoring: / EKG:  The patient was maintained on a cardiac monitor.  I personally viewed and interpreted the cardiac monitored  which showed an underlying rhythm of: Normal sinus rhythm   Consultations Obtained:  I requested consultation with the hospital,  and discussed lab and imaging findings as well as pertinent plan - they recommend: Admit for IV antibiotics and treatment of hypokalemia   Problem List / ED Course / Critical interventions / Medication management  Hypokalemia, UTI I ordered medication including Rocephin for UTI and potassium for hypokalemia Reevaluation of the patient after these medicines showed that the patient improved I have reviewed the patients home medicines and have made adjustments as needed   Social Determinants of Health:  None   Test / Admission - Considered:  No other test needed  Patient with pyelonephritis and hypokalemia.  She will be admitted to medicine and treated for pyelonephritis and hypokalemia        Final Clinical Impression(s) / ED Diagnoses Final diagnoses:  Pyelonephritis    Rx / DC Orders ED Discharge Orders     None         Milton Ferguson, MD 04/15/22 [id number]

## 2022-04-15 NOTE — ED Triage Notes (Signed)
Pt c/o foul smelling urine and sharp right flank pain that started Monday, fever last night and SOB since this morning.

## 2022-04-15 NOTE — ED Notes (Addendum)
Dr. Roderic Palau notified of suspected sepsis pt per protocol.

## 2022-04-15 NOTE — H&P (Signed)
Patient Demographics:    Katelyn [name], is a 71 y.o. female  MRN: [id number]   DOB - 10/19/1951  Admit Date - 04/15/2022  Outpatient Primary MD for the patient is Jake Samples, PA-C   Assessment & Plan:   Assessment and Plan:   1)Sepsis secondary to UTI/Pyelonephritis---POA--- -Patient met sepsis criteria on admission with temp of 101.3, WBC 12.4, respiratory 28, heart rate 91  clinical exam, lab findings and imaging findings suggest urinary source/pyelonephritis -IV Rocephin pending culture -Lactic acid is not elevated -IV fluids  2)DM2-hold metformin, Use Novolog/Humalog Sliding scale insulin with Accu-Cheks/Fingersticks as ordered   3)HTN-hold benazepril, hold Lasix, hold lisinopril Give amlodipine 10 mg daily, -IV hydralazine as needed elevated BP  4)HLD--- continue statin  5)Hyponatremia/Hypokalemia----hydrate and replace electrolytes, check magnesium  6)AKI----acute kidney injury -due to UTI and dehydration compounded by diuretic and ACE inhibitor use -Hold Lasix, hold benazepril and lisinopril --- renally adjust medications, avoid nephrotoxic agents / dehydration  / hypotension   Disposition/Need for in-Hospital Stay- patient unable to be discharged at this time due to Sepsis due to PyeloNephritis/UTI requiring iv Antibiotics and IVF   Status is: Inpatient  Remains inpatient appropriate because:   Dispo: The patient is from: Home              Anticipated d/c is to: Home              Anticipated d/c date is: 2 days              Patient currently is not medically stable to d/c. Barriers: Not Clinically Stable-   With History of - Reviewed by me  Past Medical History:  Diagnosis Date   Acute meniscal tear of knee    RIGHT   Arthritis    BACK   Diabetes mellitus, type 2 ([place])     Hypercholesteremia    Hypertension    Parathyroid disorder [facility])       Past Surgical History:  Procedure Laterality Date   COLONOSCOPY     COLONOSCOPY N/A 12/01/2017   Procedure: COLONOSCOPY;  Surgeon: Daneil Dolin, MD;  [facility];  Service: Endoscopy;  Laterality: N/A;  10:45   KNEE ARTHROSCOPY WITH LATERAL MENISECTOMY Right 02/16/2013   Procedure: RIGHT KNEE  ARTHROSCOPY WITH PARTIAL LATERAL MENISECTOMY; Excision of plica with shaving of patella,  partial medial menisectomy;  Surgeon: Magnus Sinning, MD;  Location: [place];  Service: Orthopedics;  Laterality: Right;   POLYPECTOMY  12/01/2017   Procedure: POLYPECTOMY;  Surgeon: Daneil Dolin, MD;  [facility];  Service: Endoscopy;;    Chief Complaint  Patient presents with   Fever      HPI:    Katelyn [name]  is a 71 y.o. female with past medical history relevant for HTN, DM2, and HLD who presents to the ED with complaints of fevers, chills, nausea but no emesis, abdominal and right flank pain  with dysuria for the last 24 hours or so -No chest pains no palpitations no dizziness -Had "shob" to severe right flank pain otherwise no dyspnea -Additional history obtained from patient's 2 daughters (daughters [name] and [place])  at bedside  -Last BM today without diarrhea --Chest x-ray without acute finding -CT renal protocol with findings suspicious for possible pyelonephritis and chronic medullary nephrocalcinosis -UA suspicious for UTI urine culture pending -WBC 12.4 -Sodium 133, potassium 2.7, anion gap  10, creatinine is 1.41 previously normal but no recent baseline available at this time -INR 1.3, lactic acid 1.7   Review of systems:    In addition to the HPI above,   A full Review of  Systems was done, all other systems reviewed are negative except as noted above in HPI , .   Social History:  Reviewed by me   Social History   Tobacco Use   Smoking status: Never    Smokeless tobacco: Never  Substance Use Topics   Alcohol use: No   PMHx -HTN DM2 HLD    Family History :  Reviewed by me    Family History  Problem Relation Age of Onset   Colon cancer Father    Diabetes Father    Colon cancer Brother    Stroke Mother    Hyperparathyroidism Neg Hx      Home Medications:   Prior to Admission medications   Medication Sig Start Date End Date Taking? Authorizing Provider  amLODipine-benazepril (LOTREL) 10-40 MG capsule Take 1 capsule by mouth daily. 03/03/22  Yes [provider]  aspirin EC 81 MG tablet Take 81 mg by mouth daily.   Yes [provider]  cholecalciferol (VITAMIN D) 1000 units tablet Take 1,000 Units by mouth daily.   Yes [provider]  furosemide (LASIX) 20 MG tablet TAKE 1 TABLET EVERY DAY Patient taking differently: Take 20 mg by mouth daily. 11/07/18  Yes Renato Shin, MD  glipiZIDE (GLUCOTROL XL) 10 MG 24 hr tablet Take 10 mg by mouth daily with breakfast. 03/03/22  Yes [provider]  JARDIANCE 25 MG TABS tablet Take 25 mg by mouth daily. 03/20/22  Yes [provider]  lisinopril (PRINIVIL,ZESTRIL) 20 MG tablet Take 20 mg by mouth daily.   Yes [provider]  metFORMIN (GLUCOPHAGE) 1000 MG tablet Take 1,000 mg by mouth daily at 6 (six) AM.   Yes [provider]  metoprolol succinate (TOPROL-XL) 50 MG 24 hr tablet Take 50 mg by mouth daily. 03/03/22  Yes [provider]  OMEGA 3 1000 MG CAPS Take 1 capsule by mouth daily.   Yes [provider]  rosuvastatin (CRESTOR) 20 MG tablet Take 20 mg by mouth daily. 10/20/18  Yes [provider]     Allergies:    No Known Allergies   Physical Exam:   Vitals  Blood pressure (!) 151/86, pulse 81, temperature 98.3 F (36.8 C), temperature source Oral, resp. rate (!) 21, height 5' 7"  (1.702 m), weight 84.4 kg, SpO2 98 %.  Physical Examination: General appearance - alert,  in no distress and   Mental status - alert, oriented to person, place, and time,  Eyes - sclera anicteric Neck - supple, no JVD elevation , Chest - clear  to auscultation bilaterally, symmetrical air movement,  Heart - S1 and S2 normal, regular  Abdomen - soft, , nondistended, +BS, right flank tenderness/CVA area tenderness Neurological - screening mental status exam normal, neck supple without rigidity, cranial nerves II through XII  intact, DTR's normal and symmetric Extremities - no pedal edema noted, intact peripheral pulses  Skin - warm, dry    Data Review:    CBC Recent Labs  Lab 04/15/22 0852  WBC 12.4*  HGB 12.0  HCT 36.4  PLT 226  MCV 85.8  MCH 28.3  MCHC 33.0  RDW 14.2  LYMPHSABS 0.9  MONOABS 0.8  EOSABS 0.0  BASOSABS 0.0   ------------------------------------------------------------------------------------------------------------------  Chemistries  Recent Labs  Lab 04/15/22 0852  NA 133*  K 2.7*  CL 102  CO2 21*  GLUCOSE 255*  BUN 20  CREATININE 1.41*  CALCIUM 9.8  AST 31  ALT 31  ALKPHOS 100  BILITOT 0.4   ------------------------------------------------------------------------------------------------------------------ estimated creatinine clearance is 41.4 mL/min (A) (by C-G formula based on SCr of 1.41 mg/dL (H)). ------------------------------------------------------------------------------------------------------------------ No results for input(s): "TSH", "T4TOTAL", "T3FREE", "THYROIDAB" in the last 72 hours.  Invalid input(s): "FREET3"   Coagulation profile Recent Labs  Lab 04/15/22 0852  INR 1.3*   ------------------------------------------------------------------------------------------------------------------- No results for input(s): "DDIMER" in the last 72 hours. -------------------------------------------------------------------------------------------------------------------  Urinalysis    Component Value Date/Time   COLORURINE STRAW (A)  04/15/2022 0837   APPEARANCEUR CLEAR 04/15/2022 0837   LABSPEC 1.011 04/15/2022 0837   PHURINE 7.0 04/15/2022 0837   GLUCOSEU >=500 (A) 04/15/2022 0837   HGBUR LARGE (A) 04/15/2022 0837   BILIRUBINUR NEGATIVE 04/15/2022 0837   KETONESUR NEGATIVE 04/15/2022 0837   PROTEINUR 30 (A) 04/15/2022 0837   NITRITE NEGATIVE 04/15/2022 0837   LEUKOCYTESUR SMALL (A) 04/15/2022 0837   ----------------------------------------------------------------------------------------------------------------   Imaging Results:    CT Renal Stone Study  Result Date: 04/15/2022 CLINICAL DATA:  Flank pain, kidney stone suspected. EXAM: CT ABDOMEN AND PELVIS WITHOUT CONTRAST TECHNIQUE: Multidetector CT imaging of the abdomen and pelvis was performed following the standard protocol without IV contrast. RADIATION DOSE REDUCTION: This exam was performed according to the departmental dose-optimization program which includes automated exposure control, adjustment of the mA and/or kV according to patient size and/or use of iterative reconstruction technique. COMPARISON:  CT examination dated September 24, 2018 FINDINGS: Lower chest: Subsegmental linear atelectasis.  No acute abnormality. Hepatobiliary: No focal liver abnormality is seen. No gallstones, gallbladder wall thickening, or biliary dilatation. Pancreas: Unremarkable. No pancreatic ductal dilatation or surrounding inflammatory changes. Spleen: Normal in size without focal abnormality. Adrenals/Urinary Tract: Adrenal glands are unremarkable. There is increased echogenicity of the renal pyramids with punctate coarse calcifications suggesting chronic medullary nephrocalcinosis, unchanged. There is mild right hydroureter and right perinephric fat stranding. There is no appreciable ureteral calculus. Multiple pelvic phleboliths. Bladder is unremarkable. Stomach/Bowel: Small hiatal hernia. Stomach is within normal limits. Appendix not clearly identified. No evidence of bowel wall  thickening, distention, or inflammatory changes. Vascular/Lymphatic: Aortic atherosclerosis. No enlarged abdominal or pelvic lymph nodes. Reproductive: Uterus is present with small degenerated fibroids. Adnexa are unremarkable. Other: No abdominal wall hernia or abnormality. No abdominopelvic ascites. Musculoskeletal: No acute or significant osseous findings. IMPRESSION: 1. Echogenic renal pyramids bilaterally with punctate calcifications suggesting chronic medullary nephrocalcinosis similar to prior examination. 2. Mild right hydronephrosis and right perinephric stranding without evidence of ureteral calculus, suspicious for infectious/inflammatory process including pyelonephritis. Evaluation is limited on this noncontrast enhanced examination. Correlation with urinalysis would be helpful. 3.  Atherosclerotic disease of abdominal aorta and branch vessels. Electronically Signed   By: Keane Police D.O.   On: 04/15/2022 11:10   DG Chest Port 1 View  Result Date: 04/15/2022 CLINICAL DATA:  71 year old female with sepsis. EXAM: PORTABLE CHEST  1 VIEW COMPARISON:  No priors. FINDINGS: Lung volumes are normal. No consolidative airspace disease. No pleural effusions. No pneumothorax. No pulmonary nodule or mass noted. Pulmonary vasculature and the cardiomediastinal silhouette are within normal limits. Atherosclerosis in the thoracic aorta. IMPRESSION: 1. No radiographic evidence of acute cardiopulmonary disease. 2. Aortic atherosclerosis. Electronically Signed   By: Vinnie Langton M.D.   On: 04/15/2022 09:00    Radiological Exams on Admission: CT Renal Stone Study  Result Date: 04/15/2022 CLINICAL DATA:  Flank pain, kidney stone suspected. EXAM: CT ABDOMEN AND PELVIS WITHOUT CONTRAST TECHNIQUE: Multidetector CT imaging of the abdomen and pelvis was performed following the standard protocol without IV contrast. RADIATION DOSE REDUCTION: This exam was performed according to the departmental dose-optimization  program which includes automated exposure control, adjustment of the mA and/or kV according to patient size and/or use of iterative reconstruction technique. COMPARISON:  CT examination dated September 24, 2018 FINDINGS: Lower chest: Subsegmental linear atelectasis.  No acute abnormality. Hepatobiliary: No focal liver abnormality is seen. No gallstones, gallbladder wall thickening, or biliary dilatation. Pancreas: Unremarkable. No pancreatic ductal dilatation or surrounding inflammatory changes. Spleen: Normal in size without focal abnormality. Adrenals/Urinary Tract: Adrenal glands are unremarkable. There is increased echogenicity of the renal pyramids with punctate coarse calcifications suggesting chronic medullary nephrocalcinosis, unchanged. There is mild right hydroureter and right perinephric fat stranding. There is no appreciable ureteral calculus. Multiple pelvic phleboliths. Bladder is unremarkable. Stomach/Bowel: Small hiatal hernia. Stomach is within normal limits. Appendix not clearly identified. No evidence of bowel wall thickening, distention, or inflammatory changes. Vascular/Lymphatic: Aortic atherosclerosis. No enlarged abdominal or pelvic lymph nodes. Reproductive: Uterus is present with small degenerated fibroids. Adnexa are unremarkable. Other: No abdominal wall hernia or abnormality. No abdominopelvic ascites. Musculoskeletal: No acute or significant osseous findings. IMPRESSION: 1. Echogenic renal pyramids bilaterally with punctate calcifications suggesting chronic medullary nephrocalcinosis similar to prior examination. 2. Mild right hydronephrosis and right perinephric stranding without evidence of ureteral calculus, suspicious for infectious/inflammatory process including pyelonephritis. Evaluation is limited on this noncontrast enhanced examination. Correlation with urinalysis would be helpful. 3.  Atherosclerotic disease of abdominal aorta and branch vessels. Electronically Signed   By:  Keane Police D.O.   On: 04/15/2022 11:10   DG Chest Port 1 View  Result Date: 04/15/2022 CLINICAL DATA:  71 year old female with sepsis. EXAM: PORTABLE CHEST 1 VIEW COMPARISON:  No priors. FINDINGS: Lung volumes are normal. No consolidative airspace disease. No pleural effusions. No pneumothorax. No pulmonary nodule or mass noted. Pulmonary vasculature and the cardiomediastinal silhouette are within normal limits. Atherosclerosis in the thoracic aorta. IMPRESSION: 1. No radiographic evidence of acute cardiopulmonary disease. 2. Aortic atherosclerosis. Electronically Signed   By: Vinnie Langton M.D.   On: 04/15/2022 09:00    DVT Prophylaxis -SCD/Heparin  AM Labs Ordered, also please review Full Orders  Family Communication: Admission, patients condition and plan of care including tests being ordered have been discussed with the patient and daughters [name] and [place])  who indicate understanding and agree with the plan   Condition  -stable  Roxan Hockey M.D on 04/15/2022 at 4:58 PM Go to [email or web address] -  for contact info  [facility] - Office  [phone]

## 2022-04-15 NOTE — Sepsis Progress Note (Signed)
eLink is following this Code Sepsis. °

## 2022-04-16 DIAGNOSIS — A419 Sepsis, unspecified organism: Secondary | ICD-10-CM | POA: Diagnosis not present

## 2022-04-16 DIAGNOSIS — I10 Essential (primary) hypertension: Secondary | ICD-10-CM | POA: Diagnosis not present

## 2022-04-16 DIAGNOSIS — E1165 Type 2 diabetes mellitus with hyperglycemia: Secondary | ICD-10-CM | POA: Diagnosis not present

## 2022-04-16 LAB — BLOOD CULTURE ID PANEL (REFLEXED) - BCID2

## 2022-04-16 LAB — CBC
HCT: 37.4 % (ref 36.0–46.0)
Hemoglobin: 11.7 g/dL — ABNORMAL LOW (ref 12.0–15.0)
MCH: 27.7 pg (ref 26.0–34.0)
MCHC: 31.3 g/dL (ref 30.0–36.0)
MCV: 88.6 fL (ref 80.0–100.0)
Platelets: 242 10*3/uL (ref 150–400)
RBC: 4.22 MIL/uL (ref 3.87–5.11)
RDW: 14.3 % (ref 11.5–15.5)
WBC: 9.5 10*3/uL (ref 4.0–10.5)
nRBC: 0 % (ref 0.0–0.2)

## 2022-04-16 LAB — BASIC METABOLIC PANEL
Anion gap: 7 (ref 5–15)
BUN: 13 mg/dL (ref 8–23)
CO2: 20 mmol/L — ABNORMAL LOW (ref 22–32)
Calcium: 9.9 mg/dL (ref 8.9–10.3)
Chloride: 113 mmol/L — ABNORMAL HIGH (ref 98–111)
Creatinine, Ser: 1.07 mg/dL — ABNORMAL HIGH (ref 0.44–1.00)
GFR, Estimated: 56 mL/min — ABNORMAL LOW (ref >60)
Glucose, Bld: 191 mg/dL — ABNORMAL HIGH (ref 70–99)
Potassium: 2.9 mmol/L — ABNORMAL LOW (ref 3.5–5.1)
Sodium: 140 mmol/L (ref 135–145)

## 2022-04-16 LAB — GLUCOSE, CAPILLARY
Glucose-Capillary: 210 mg/dL — ABNORMAL HIGH (ref 70–99)
Glucose-Capillary: 190 mg/dL — ABNORMAL HIGH (ref 70–99)
Glucose-Capillary: 198 mg/dL — ABNORMAL HIGH (ref 70–99)
Glucose-Capillary: 161 mg/dL — ABNORMAL HIGH (ref 70–99)

## 2022-04-16 LAB — HEMOGLOBIN A1C
Hgb A1c MFr Bld: 7 % — ABNORMAL HIGH (ref 4.8–5.6)
Mean Plasma Glucose: 154 mg/dL

## 2022-04-16 LAB — HIV ANTIBODY (ROUTINE TESTING W REFLEX): HIV Screen 4th Generation wRfx: NONREACTIVE

## 2022-04-16 MED ORDER — POTASSIUM CHLORIDE CRYS ER 20 MEQ PO TBCR
40.0000 meq | EXTENDED_RELEASE_TABLET | ORAL | Status: AC
  Administered 2022-04-16 (×2): 40 meq via ORAL
  Filled 2022-04-16 (×2): qty 2

## 2022-04-16 NOTE — TOC Progression Note (Signed)
Transition of Care [facility]) - Progression Note    Patient Details  Name: Katelyn [name] MRN: [id number] Date of Birth: [birth year]  Transition of Care [facility]) CM/SW Contact  Salome Arnt, [place] Phone Number: 04/16/2022, 10:08 AM  Clinical Narrative:   Transition of Care (TOC) Screening Note   Patient Details  Name: Katelyn [name] Date of Birth: September 17, 1951   Transition of Care [facility]) CM/SW Contact:    Salome Arnt, [place] Phone Number: 04/16/2022, 10:08 AM    Transition of Care Department [facility]) has reviewed patient and no TOC needs have been identified at this time. We will continue to monitor patient advancement through interdisciplinary progression rounds. If new patient transition needs arise, please place a TOC consult.            Expected Discharge Plan and Services                                                 Social Determinants of Health (SDOH) Interventions    Readmission Risk Interventions     No data to display

## 2022-04-16 NOTE — Progress Notes (Signed)
PROGRESS NOTE     Katelyn [name], is a 71 y.o. female, DOB - 1951-03-04, OTL:[id number]  Admit date - 04/15/2022   Admitting Physician Hazim Treadway Denton Brick, MD  Outpatient Primary MD for the patient is Jake Samples, PA-C  LOS - 1  Chief Complaint  Patient presents with   Fever        Brief Narrative:  71 y.o. female with past medical history relevant for HTN, DM2, and HLD admitted on 04/15/2022 with sepsis secondary to pyelonephritis/gram-negative rod UTI  A/p 1)Sepsis secondary to UTI/Pyelonephritis---POA--- -Patient met sepsis criteria on admission -Preliminary blood cultures with gram-negative rod bacteria -Continue IV Rocephin and fluids - 2)DM2-hold metformin, Use Novolog/Humalog Sliding scale insulin with Accu-Cheks/Fingersticks as ordered    3)HTN-hold benazepril, hold Lasix, hold lisinopril C/n amlodipine 10 mg daily, -IV hydralazine as needed elevated BP   4)HLD--- continue statin   5)Hyponatremia/Hypokalemia----hydrate and replace electrolytes,  magnesium WNL   6)AKI----acute kidney injury -due to UTI and dehydration compounded by diuretic and ACE inhibitor use -Hold Lasix, hold benazepril and lisinopril -Creatinine down to 1.0 from 1.41 --- renally adjust medications, avoid nephrotoxic agents / dehydration  / hypotension     Disposition/Need for in-Hospital Stay- patient unable to be discharged at this time due to Sepsis due to PyeloNephritis/UTI requiring iv Antibiotics and IVF    Status is: Inpatient   Remains inpatient appropriate because:    Dispo: The patient is from: Home              Anticipated d/c is to: Home              Anticipated d/c date is: 1 days              Patient currently is not medically stable to d/c. Barriers: Not Clinically Stable-  Code Status :  -  Code Status: Full Code   Family Communication:    NA (patient is alert, awake and coherent)   DVT Prophylaxis  :   - SCDs   heparin injection 5,000 Units Start: 04/15/22  1745 SCDs Start: 04/15/22 1651 Place TED hose Start: 04/15/22 1651   Lab Results  Component Value Date   PLT 242 04/16/2022    Inpatient Medications  Scheduled Meds:  amLODipine  10 mg Oral Daily   aspirin EC  81 mg Oral Daily   heparin  5,000 Units Subcutaneous Q8H   insulin aspart  0-15 Units Subcutaneous TID WC   insulin aspart  0-5 Units Subcutaneous QHS   metoprolol succinate  50 mg Oral Daily   rosuvastatin  20 mg Oral q1800   sodium chloride flush  3 mL Intravenous Q12H   sodium chloride flush  3 mL Intravenous Q12H   Continuous Infusions:  sodium chloride     cefTRIAXone (ROCEPHIN)  IV 2 g (04/16/22 0552)   PRN Meds:.sodium chloride, acetaminophen **OR** acetaminophen, bisacodyl, fentaNYL (SUBLIMAZE) injection, hydrALAZINE, ondansetron (ZOFRAN) IV, mouth rinse, polyethylene glycol, sodium chloride flush, traZODone   Anti-infectives (From admission, onward)    Start     Dose/Rate Route Frequency Ordered Stop   04/16/22 0600  cefTRIAXone (ROCEPHIN) 2 g in sodium chloride 0.9 % 100 mL IVPB        2 g 200 mL/hr over 30 Minutes Intravenous Every 24 hours 04/15/22 1140     04/15/22 0945  cefTRIAXone (ROCEPHIN) 2 g in sodium chloride 0.9 % 100 mL IVPB        2 g 200 mL/hr over 30 Minutes Intravenous  Once  04/15/22 0931 04/15/22 1015   04/15/22 0845  cefTRIAXone (ROCEPHIN) 2 g in sodium chloride 0.9 % 100 mL IVPB        2 g 200 mL/hr over 30 Minutes Intravenous  Once 04/15/22 0843 04/15/22 0925         Subjective: Katelyn [name] today has no further fevers, no emesis,  No chest pain,   -Flank pain CVA area tenderness improving -No further dysuria   Objective: Vitals:   04/15/22 2013 04/15/22 2353 04/16/22 0433 04/16/22 1232  BP: 134/90 (!) 154/80 (!) 145/71 (!) 154/83  Pulse: 76 79 76 82  Resp: 17 20 20 18   Temp: 99.5 F (37.5 C) 97.8 F (36.6 C) 97.6 F (36.4 C) 98.4 F (36.9 C)  TempSrc:    Oral  SpO2: 97% 99% 98% 98%  Weight:      Height:         Intake/Output Summary (Last 24 hours) at 04/16/2022 1857 Last data filed at 04/16/2022 1700 Gross per 24 hour  Intake 1060 ml  Output --  Net 1060 ml   Filed Weights   04/15/22 0829  Weight: 84.4 kg    Physical Exam  Gen:- Awake Alert, no acute distress HEENT:- [place].AT, No sclera icterus Neck-Supple Neck,No JVD,.  Lungs-  CTAB , fair symmetrical air movement CV- S1, S2 normal, regular  Abd-  +ve B.Sounds, Abd Soft, CVA area tenderness improving Extremity/Skin:- No  edema, pedal pulses present  Psych-affect is appropriate, oriented x3 Neuro-no new focal deficits, no tremors  Data Reviewed: I have personally reviewed following labs and imaging studies  CBC: Recent Labs  Lab 04/15/22 0852 04/16/22 0609  WBC 12.4* 9.5  NEUTROABS 10.7*  --   HGB 12.0 11.7*  HCT 36.4 37.4  MCV 85.8 88.6  PLT 226 009   Basic Metabolic Panel: Recent Labs  Lab 04/15/22 0852 04/15/22 1021 04/16/22 0609  NA 133*  --  140  K 2.7*  --  2.9*  CL 102  --  113*  CO2 21*  --  20*  GLUCOSE 255*  --  191*  BUN 20  --  13  CREATININE 1.41*  --  1.07*  CALCIUM 9.8  --  9.9  MG  --  2.1  --    GFR: Estimated Creatinine Clearance: 54.6 mL/min (A) (by C-G formula based on SCr of 1.07 mg/dL (H)). Liver Function Tests: Recent Labs  Lab 04/15/22 0852  AST 31  ALT 31  ALKPHOS 100  BILITOT 0.4  PROT 7.8  ALBUMIN 3.2*   Cardiac Enzymes: No results for input(s): "CKTOTAL", "CKMB", "CKMBINDEX", "TROPONINI" in the last 168 hours. BNP (last 3 results) No results for input(s): "PROBNP" in the last 8760 hours. HbA1C: Recent Labs    04/15/22 0852  HGBA1C 7.0*   Sepsis Labs: @LABRCNTIP (procalcitonin:4,lacticidven:4) ) Recent Results (from the past 240 hour(s))  Urine Culture     Status: Abnormal (Preliminary result)   Collection Time: 04/15/22  8:43 AM   Specimen: In/Out Cath Urine  Result Value Ref Range Status   Specimen Description   Final    IN/OUT CATH URINE Performed at [facility], [address] [place], [place] [address]    Special Requests   Final    NONE Performed at [facility], [address], [place], [place] [address]    Culture (A)  Final    20,000 COLONIES/mL ESCHERICHIA COLI SUSCEPTIBILITIES TO FOLLOW Performed at [facility], [place] [address] [place], [place] [address]  Report Status PENDING  Incomplete  Resp Panel by RT-PCR (Flu A&B, Covid) Anterior Nasal Swab     Status: None   Collection Time: 04/15/22  8:48 AM   Specimen: Anterior Nasal Swab  Result Value Ref Range Status   SARS Coronavirus 2 by RT PCR NEGATIVE NEGATIVE Final    Comment: (NOTE) SARS-CoV-2 target nucleic acids are NOT DETECTED.  The SARS-CoV-2 RNA is generally detectable in upper respiratory specimens during the acute phase of infection. The lowest concentration of SARS-CoV-2 viral copies this assay can detect is 138 copies/mL. A negative result does not preclude SARS-Cov-2 infection and should not be used as the sole basis for treatment or other patient management decisions. A negative result may occur with  improper specimen collection/handling, submission of specimen other than nasopharyngeal swab, presence of viral mutation(s) within the areas targeted by this assay, and inadequate number of viral copies(<138 copies/mL). A negative result must be combined with clinical observations, patient history, and epidemiological information. The expected result is Negative.  Fact Sheet for Patients:  EntrepreneurPulse.com.au  Fact Sheet for Healthcare Providers:  IncredibleEmployment.be  This test is no t yet approved or cleared by the [place] FDA and  has been authorized for detection and/or diagnosis of SARS-CoV-2 by FDA under an Emergency Use Authorization (EUA). This EUA will remain  in effect (meaning this test can be used) for the duration of the COVID-19 declaration under Section 564(b)(1) of the Act,  21 U.S.C.section 360bbb-3(b)(1), unless the authorization is terminated  or revoked sooner.       Influenza A by PCR NEGATIVE NEGATIVE Final   Influenza B by PCR NEGATIVE NEGATIVE Final    Comment: (NOTE) The Xpert Xpress SARS-CoV-2/FLU/RSV plus assay is intended as an aid in the diagnosis of influenza from Nasopharyngeal swab specimens and should not be used as a sole basis for treatment. Nasal washings and aspirates are unacceptable for Xpert Xpress SARS-CoV-2/FLU/RSV testing.  Fact Sheet for Patients: EntrepreneurPulse.com.au  Fact Sheet for Healthcare Providers: IncredibleEmployment.be  This test is not yet approved or cleared by the [place] FDA and has been authorized for detection and/or diagnosis of SARS-CoV-2 by FDA under an Emergency Use Authorization (EUA). This EUA will remain in effect (meaning this test can be used) for the duration of the COVID-19 declaration under Section 564(b)(1) of the Act, 21 U.S.C. section 360bbb-3(b)(1), unless the authorization is terminated or revoked.  Performed at [facility], [address], [place], [place] [address]   Culture, blood (Routine x 2)     Status: None (Preliminary result)   Collection Time: 04/15/22  8:53 AM   Specimen: BLOOD RIGHT ARM  Result Value Ref Range Status   Specimen Description   Final    BLOOD RIGHT ARM Performed at [facility], [address], [place], [place] [address]    Special Requests   Final    BOTTLES DRAWN AEROBIC AND ANAEROBIC Blood Culture results may not be optimal due to an excessive volume of blood received in culture bottles Performed at [place] [place], [place] [address]    Culture  Setup Time   Final    GRAM NEGATIVE RODS AEROBIC BOTTLE Gram Stain Report Called to,Read Back By and Verified With: BROWN,B@0325  BY MATTHEWS, B 6.29.2023 CRITICAL RESULT CALLED TO, READ BACK BY AND VERIFIED WITH: SCOTT HALL PHARMD @1009  04/16/22  E,BENTON Performed at [facility], [place] [address], [place], [place] [address]    Culture GRAM NEGATIVE RODS  Final  Report Status PENDING  Incomplete  Blood Culture ID Panel (Reflexed)     Status: Abnormal   Collection Time: 04/15/22  8:53 AM  Result Value Ref Range Status   Enterococcus faecalis NOT DETECTED NOT DETECTED Final   Enterococcus Faecium NOT DETECTED NOT DETECTED Final   Listeria monocytogenes NOT DETECTED NOT DETECTED Final   Staphylococcus species NOT DETECTED NOT DETECTED Final   Staphylococcus aureus (BCID) NOT DETECTED NOT DETECTED Final   Staphylococcus epidermidis NOT DETECTED NOT DETECTED Final   Staphylococcus lugdunensis NOT DETECTED NOT DETECTED Final   Streptococcus species NOT DETECTED NOT DETECTED Final   Streptococcus agalactiae NOT DETECTED NOT DETECTED Final   Streptococcus pneumoniae NOT DETECTED NOT DETECTED Final   Streptococcus pyogenes NOT DETECTED NOT DETECTED Final   A.calcoaceticus-baumannii NOT DETECTED NOT DETECTED Final   Bacteroides fragilis NOT DETECTED NOT DETECTED Final   Enterobacterales DETECTED (A) NOT DETECTED Final    Comment: Enterobacterales represent a large order of gram negative bacteria, not a single organism. CRITICAL RESULT CALLED TO, READ BACK BY AND VERIFIED WITH: SCOTT HALL PHARMD @1009  04/16/22 E,BENTON    Enterobacter cloacae complex NOT DETECTED NOT DETECTED Final   Escherichia coli DETECTED (A) NOT DETECTED Final    Comment: CRITICAL RESULT CALLED TO, READ BACK BY AND VERIFIED WITH: SCOTT HALL PHARMD @1009  04/16/22 E,BENTON    Klebsiella aerogenes NOT DETECTED NOT DETECTED Final   Klebsiella oxytoca NOT DETECTED NOT DETECTED Final   Klebsiella pneumoniae NOT DETECTED NOT DETECTED Final   Proteus species NOT DETECTED NOT DETECTED Final   Salmonella species NOT DETECTED NOT DETECTED Final   Serratia marcescens NOT DETECTED NOT DETECTED Final   Haemophilus influenzae NOT DETECTED NOT DETECTED Final   Neisseria  meningitidis NOT DETECTED NOT DETECTED Final   Pseudomonas aeruginosa NOT DETECTED NOT DETECTED Final   Stenotrophomonas maltophilia NOT DETECTED NOT DETECTED Final   Candida albicans NOT DETECTED NOT DETECTED Final   Candida auris NOT DETECTED NOT DETECTED Final   Candida glabrata NOT DETECTED NOT DETECTED Final   Candida krusei NOT DETECTED NOT DETECTED Final   Candida parapsilosis NOT DETECTED NOT DETECTED Final   Candida tropicalis NOT DETECTED NOT DETECTED Final   Cryptococcus neoformans/gattii NOT DETECTED NOT DETECTED Final   CTX-M ESBL NOT DETECTED NOT DETECTED Final   Carbapenem resistance IMP NOT DETECTED NOT DETECTED Final   Carbapenem resistance KPC NOT DETECTED NOT DETECTED Final   Carbapenem resistance NDM NOT DETECTED NOT DETECTED Final   Carbapenem resist OXA 48 LIKE NOT DETECTED NOT DETECTED Final   Carbapenem resistance VIM NOT DETECTED NOT DETECTED Final    Comment: Performed at [facility], [address] [place], [place] [address]  Culture, blood (Routine x 2)     Status: None (Preliminary result)   Collection Time: 04/15/22  8:54 AM   Specimen: BLOOD LEFT WRIST  Result Value Ref Range Status   Specimen Description BLOOD LEFT WRIST  Final   Special Requests   Final    BOTTLES DRAWN AEROBIC AND ANAEROBIC Blood Culture results may not be optimal due to an inadequate volume of blood received in culture bottles   Culture   Final    NO GROWTH < 24 HOURS Performed at [facility], [address], [place], [place] [address]    Report Status PENDING  Incomplete      Radiology Studies: CT Renal Stone Study  Result Date: 04/15/2022 CLINICAL DATA:  Flank pain, kidney stone suspected. EXAM: CT ABDOMEN AND PELVIS WITHOUT  CONTRAST TECHNIQUE: Multidetector CT imaging of the abdomen and pelvis was performed following the standard protocol without IV contrast. RADIATION DOSE REDUCTION: This exam was performed according to the departmental dose-optimization program which  includes automated exposure control, adjustment of the mA and/or kV according to patient size and/or use of iterative reconstruction technique. COMPARISON:  CT examination dated September 24, 2018 FINDINGS: Lower chest: Subsegmental linear atelectasis.  No acute abnormality. Hepatobiliary: No focal liver abnormality is seen. No gallstones, gallbladder wall thickening, or biliary dilatation. Pancreas: Unremarkable. No pancreatic ductal dilatation or surrounding inflammatory changes. Spleen: Normal in size without focal abnormality. Adrenals/Urinary Tract: Adrenal glands are unremarkable. There is increased echogenicity of the renal pyramids with punctate coarse calcifications suggesting chronic medullary nephrocalcinosis, unchanged. There is mild right hydroureter and right perinephric fat stranding. There is no appreciable ureteral calculus. Multiple pelvic phleboliths. Bladder is unremarkable. Stomach/Bowel: Small hiatal hernia. Stomach is within normal limits. Appendix not clearly identified. No evidence of bowel wall thickening, distention, or inflammatory changes. Vascular/Lymphatic: Aortic atherosclerosis. No enlarged abdominal or pelvic lymph nodes. Reproductive: Uterus is present with small degenerated fibroids. Adnexa are unremarkable. Other: No abdominal wall hernia or abnormality. No abdominopelvic ascites. Musculoskeletal: No acute or significant osseous findings. IMPRESSION: 1. Echogenic renal pyramids bilaterally with punctate calcifications suggesting chronic medullary nephrocalcinosis similar to prior examination. 2. Mild right hydronephrosis and right perinephric stranding without evidence of ureteral calculus, suspicious for infectious/inflammatory process including pyelonephritis. Evaluation is limited on this noncontrast enhanced examination. Correlation with urinalysis would be helpful. 3.  Atherosclerotic disease of abdominal aorta and branch vessels. Electronically Signed   By: Keane Police D.O.    On: 04/15/2022 11:10   DG Chest Port 1 View  Result Date: 04/15/2022 CLINICAL DATA:  71 year old female with sepsis. EXAM: PORTABLE CHEST 1 VIEW COMPARISON:  No priors. FINDINGS: Lung volumes are normal. No consolidative airspace disease. No pleural effusions. No pneumothorax. No pulmonary nodule or mass noted. Pulmonary vasculature and the cardiomediastinal silhouette are within normal limits. Atherosclerosis in the thoracic aorta. IMPRESSION: 1. No radiographic evidence of acute cardiopulmonary disease. 2. Aortic atherosclerosis. Electronically Signed   By: Vinnie Langton M.D.   On: 04/15/2022 09:00     Scheduled Meds:  amLODipine  10 mg Oral Daily   aspirin EC  81 mg Oral Daily   heparin  5,000 Units Subcutaneous Q8H   insulin aspart  0-15 Units Subcutaneous TID WC   insulin aspart  0-5 Units Subcutaneous QHS   metoprolol succinate  50 mg Oral Daily   rosuvastatin  20 mg Oral q1800   sodium chloride flush  3 mL Intravenous Q12H   sodium chloride flush  3 mL Intravenous Q12H   Continuous Infusions:  sodium chloride     cefTRIAXone (ROCEPHIN)  IV 2 g (04/16/22 0552)     LOS: 1 day    Roxan Hockey M.D on 04/16/2022 at 6:57 PM  Go to [email or web address] - for contact info  [facility] - Office  [phone]  If 7PM-7AM, please contact night-coverage [email or web address] 04/16/2022, 6:57 PM

## 2022-04-16 NOTE — Progress Notes (Signed)
Lab called with blood culture coming back gram negative rods. MD aware. No new orders at this time.

## 2022-04-16 NOTE — Progress Notes (Signed)
PHARMACY - PHYSICIAN COMMUNICATION CRITICAL VALUE ALERT - BLOOD CULTURE IDENTIFICATION (BCID)  Katelyn [name] is an 71 y.o. female who presented to [facility] on 04/15/2022 with a chief complaint of fever  Assessment:  Sepsis secondary to UTI/Pyelonephritis. BCX+ E.Coli no resistance   Name of physician (or Provider) Contacted: Dr. Denton Brick  Current antibiotics: Ceftriaxone 2gm IV q24h  Changes to prescribed antibiotics recommended:  Continue current regimen F/U sensitivities  Results for orders placed or performed during the hospital encounter of 04/15/22  Blood Culture ID Panel (Reflexed) (Collected: 04/15/2022  8:53 AM)  Result Value Ref Range   Enterococcus faecalis NOT DETECTED NOT DETECTED   Enterococcus Faecium NOT DETECTED NOT DETECTED   Listeria monocytogenes NOT DETECTED NOT DETECTED   Staphylococcus species NOT DETECTED NOT DETECTED   Staphylococcus aureus (BCID) NOT DETECTED NOT DETECTED   Staphylococcus epidermidis NOT DETECTED NOT DETECTED   Staphylococcus lugdunensis NOT DETECTED NOT DETECTED   Streptococcus species NOT DETECTED NOT DETECTED   Streptococcus agalactiae NOT DETECTED NOT DETECTED   Streptococcus pneumoniae NOT DETECTED NOT DETECTED   Streptococcus pyogenes NOT DETECTED NOT DETECTED   A.calcoaceticus-baumannii NOT DETECTED NOT DETECTED   Bacteroides fragilis NOT DETECTED NOT DETECTED   Enterobacterales DETECTED (A) NOT DETECTED   Enterobacter cloacae complex NOT DETECTED NOT DETECTED   Escherichia coli DETECTED (A) NOT DETECTED   Klebsiella aerogenes NOT DETECTED NOT DETECTED   Klebsiella oxytoca NOT DETECTED NOT DETECTED   Klebsiella pneumoniae NOT DETECTED NOT DETECTED   Proteus species NOT DETECTED NOT DETECTED   Salmonella species NOT DETECTED NOT DETECTED   Serratia marcescens NOT DETECTED NOT DETECTED   Haemophilus influenzae NOT DETECTED NOT DETECTED   Neisseria meningitidis NOT DETECTED NOT DETECTED   Pseudomonas aeruginosa NOT DETECTED  NOT DETECTED   Stenotrophomonas maltophilia NOT DETECTED NOT DETECTED   Candida albicans NOT DETECTED NOT DETECTED   Candida auris NOT DETECTED NOT DETECTED   Candida glabrata NOT DETECTED NOT DETECTED   Candida krusei NOT DETECTED NOT DETECTED   Candida parapsilosis NOT DETECTED NOT DETECTED   Candida tropicalis NOT DETECTED NOT DETECTED   Cryptococcus neoformans/gattii NOT DETECTED NOT DETECTED   CTX-M ESBL NOT DETECTED NOT DETECTED   Carbapenem resistance IMP NOT DETECTED NOT DETECTED   Carbapenem resistance KPC NOT DETECTED NOT DETECTED   Carbapenem resistance NDM NOT DETECTED NOT DETECTED   Carbapenem resist OXA 48 LIKE NOT DETECTED NOT DETECTED   Carbapenem resistance VIM NOT DETECTED NOT DETECTED    Isac Sarna, BS Pharm D, BCPS Clinical Pharmacist 04/16/2022  12:27 PM

## 2022-04-16 NOTE — Inpatient Diabetes Management (Signed)
Inpatient Diabetes Program Recommendations  AACE/ADA: New Consensus Statement on Inpatient Glycemic Control   Target Ranges:  Prepandial:   less than 140 mg/dL      Peak postprandial:   less than 180 mg/dL (1-2 hours)      Critically ill patients:  140 - 180 mg/dL    Latest Reference Range & Units 04/15/22 09:05 04/15/22 12:35 04/15/22 16:18 04/15/22 20:16 04/16/22 07:16  Glucose-Capillary 70 - 99 mg/dL 225 (H) 196 (H) 286 (H) 220 (H) 210 (H)   Review of Glycemic Control  Diabetes history: DM2 Outpatient Diabetes medications: Glipizide XL 10 mg QAM, Jardiance 25 mg daily, Metformin 1000 mg QAM Current orders for Inpatient glycemic control: Novolog 0-15 units TID with meals, Novolog 0-5 units QHS  Inpatient Diabetes Program Recommendations:    Insulin: Please consider ordering Semglee 8 units Q24H (based on 84.4 kg x 0.1 units).  Thanks, Barnie Alderman, RN, MSN, [place] [facility] Coordinator Inpatient Diabetes Program [phone] (Team Pager from 8am to [place])

## 2022-04-17 DIAGNOSIS — E1165 Type 2 diabetes mellitus with hyperglycemia: Secondary | ICD-10-CM | POA: Diagnosis not present

## 2022-04-17 DIAGNOSIS — K760 Fatty (change of) liver, not elsewhere classified: Secondary | ICD-10-CM | POA: Diagnosis not present

## 2022-04-17 DIAGNOSIS — I10 Essential (primary) hypertension: Secondary | ICD-10-CM | POA: Diagnosis not present

## 2022-04-17 DIAGNOSIS — A419 Sepsis, unspecified organism: Secondary | ICD-10-CM | POA: Diagnosis not present

## 2022-04-17 LAB — BASIC METABOLIC PANEL WITH GFR
Anion gap: 8 (ref 5–15)
BUN: 14 mg/dL (ref 8–23)
CO2: 22 mmol/L (ref 22–32)
Calcium: 10.8 mg/dL — ABNORMAL HIGH (ref 8.9–10.3)
Chloride: 112 mmol/L — ABNORMAL HIGH (ref 98–111)
Creatinine, Ser: 1.14 mg/dL — ABNORMAL HIGH (ref 0.44–1.00)
GFR, Estimated: 52 mL/min — ABNORMAL LOW
Glucose, Bld: 179 mg/dL — ABNORMAL HIGH (ref 70–99)
Potassium: 3.7 mmol/L (ref 3.5–5.1)
Sodium: 142 mmol/L (ref 135–145)

## 2022-04-17 LAB — GLUCOSE, CAPILLARY: Glucose-Capillary: 150 mg/dL — ABNORMAL HIGH (ref 70–99)

## 2022-04-17 LAB — URINE CULTURE: Culture: 20000 — AB

## 2022-04-17 MED ORDER — CEPHALEXIN 500 MG PO CAPS: 500.0000 mg | ORAL_CAPSULE | Freq: Three times a day (TID) | ORAL | 0 refills | Status: AC

## 2022-04-17 MED ORDER — ASPIRIN EC 81 MG PO TBEC: 81.0000 mg | DELAYED_RELEASE_TABLET | Freq: Every day | ORAL | 3 refills | Status: AC

## 2022-04-17 MED ORDER — SENNOSIDES-DOCUSATE SODIUM 8.6-50 MG PO TABS
2.0000 | ORAL_TABLET | Freq: Every day | ORAL | 0 refills | Status: AC
Start: 2022-04-17 — End: 2022-05-02

## 2022-04-17 MED ORDER — ROSUVASTATIN CALCIUM 20 MG PO TABS: 20.0000 mg | ORAL_TABLET | Freq: Every day | ORAL | 3 refills | Status: DC

## 2022-04-17 MED ORDER — METFORMIN HCL 1000 MG PO TABS: 1000.0000 mg | ORAL_TABLET | Freq: Two times a day (BID) | ORAL | 5 refills | Status: DC

## 2022-04-17 MED ORDER — FUROSEMIDE 20 MG PO TABS: 20.0000 mg | ORAL_TABLET | Freq: Every day | ORAL | 2 refills | Status: DC

## 2022-04-17 MED ORDER — SENNOSIDES-DOCUSATE SODIUM 8.6-50 MG PO TABS
2.0000 | ORAL_TABLET | Freq: Once | ORAL | Status: AC
  Administered 2022-04-17: 2 via ORAL
  Filled 2022-04-17: qty 2

## 2022-04-17 MED ORDER — JARDIANCE 25 MG PO TABS: 25.0000 mg | ORAL_TABLET | Freq: Every day | ORAL | 3 refills | Status: DC

## 2022-04-17 MED ORDER — ACETAMINOPHEN 325 MG PO TABS: 650.0000 mg | ORAL_TABLET | Freq: Four times a day (QID) | ORAL | 0 refills | Status: DC | PRN

## 2022-04-17 MED ORDER — METOPROLOL SUCCINATE ER 50 MG PO TB24: 50.0000 mg | ORAL_TABLET | Freq: Every day | ORAL | 3 refills | Status: DC

## 2022-04-17 MED ORDER — GLIPIZIDE ER 10 MG PO TB24: 10.0000 mg | ORAL_TABLET | Freq: Every day | ORAL | 3 refills | Status: DC

## 2022-04-17 MED ORDER — AMLODIPINE BESY-BENAZEPRIL HCL 10-40 MG PO CAPS: 1.0000 | ORAL_CAPSULE | Freq: Every day | ORAL | 3 refills | Status: DC

## 2022-04-17 MED ORDER — OXYCODONE HCL 5 MG PO TABS: 5.0000 mg | ORAL_TABLET | Freq: Four times a day (QID) | ORAL | 0 refills | Status: DC | PRN

## 2022-04-17 NOTE — Discharge Instructions (Signed)
1)Take cephalexin/Keflex antibiotic 3 times a day for the next week as prescribed for E. coli infection in your urine and your blood  2) repeat CBC and BMP blood test with the primary care physician within the week  3) drink plenty fluids, avoid dehydration

## 2022-04-17 NOTE — Progress Notes (Signed)
Patient was complaining of feeling constipated at the beginning of the shift,  Dulcolax suppository 10 mg given. Patient slept majority of the shift only called once for a snack during the night. Continued to monitor the rest of shift.

## 2022-04-17 NOTE — Progress Notes (Signed)
Nursing Progress Note   Patient with no complaints of n&v overnight. Rested well.   Vitals:   04/16/22 2128 04/17/22 0448  BP: 133/76 (!) 152/87  Pulse: 70 69  Resp: 20 19  Temp: 98.6 F (37 C) 98.9 F (37.2 C)  SpO2: 98% 96%    Alfonse Alpers RN, BSN 04/17/2022 7:38 AM

## 2022-04-17 NOTE — Progress Notes (Signed)
  Transition of Care (TOC) Screening Note   Patient Details  Name: Katelyn [name] Date of Birth: 03-Dec-1950   Transition of Care [facility]) CM/SW Contact:    Ihor Gully, LCSW Phone Number: 04/17/2022, 1:23 PM    Transition of Care Department [facility]) has reviewed patient and no TOC needs have been identified at this time. We will continue to monitor patient advancement through interdisciplinary progression rounds. If new patient transition needs arise, please place a TOC consult.

## 2022-04-17 NOTE — Discharge Summary (Signed)
Katelyn [name], is a 71 y.o. female  DOB [birth year]  MRN [id number].  Admission date:  04/15/2022  Admitting Physician  Roxan Hockey, MD  Discharge Date:  04/17/2022   Primary MD  Jake Samples, PA-C  Recommendations for primary care physician for things to follow:   1)Take Cephalexin/Keflex antibiotic 3 times a day for the next week as prescribed for E. coli infection in your urine and your blood  2)Repeat CBC and BMP blood test with the primary care physician within the week  3)Drink plenty fluids, avoid dehydration  Admission Diagnosis  Pyelonephritis [N12] Sepsis ([place]) [A41.9]   Discharge Diagnosis  Pyelonephritis [N12] Sepsis ([place]) [A41.9]    Principal Problem:   Sepsis due to UTI/Pyrelo Active Problems:   Uncontrolled type 2 diabetes mellitus with hyperglycemia, without long-term current use of insulin ([place])   Nonalcoholic fatty liver disease without nonalcoholic steatohepatitis ([place])   HTN (hypertension)      Past Medical History:  Diagnosis Date   Acute meniscal tear of knee    RIGHT   Arthritis    BACK   Diabetes mellitus, type 2 ([place])    Hypercholesteremia    Hypertension    Parathyroid disorder [facility])     Past Surgical History:  Procedure Laterality Date   COLONOSCOPY     COLONOSCOPY N/A 12/01/2017   Procedure: COLONOSCOPY;  Surgeon: Daneil Dolin, MD;  [facility];  Service: Endoscopy;  Laterality: N/A;  10:45   KNEE ARTHROSCOPY WITH LATERAL MENISECTOMY Right 02/16/2013   Procedure: RIGHT KNEE  ARTHROSCOPY WITH PARTIAL LATERAL MENISECTOMY; Excision of plica with shaving of patella,  partial medial menisectomy;  Surgeon: Magnus Sinning, MD;  Location: [place];  Service: Orthopedics;  Laterality: Right;   POLYPECTOMY  12/01/2017   Procedure: POLYPECTOMY;  Surgeon: Daneil Dolin, MD;  [facility];  Service: Endoscopy;;      HPI  from the history and physical done on the day of admission:   Katelyn [name]  is a 71 y.o. female with past medical history relevant for HTN, DM2, and HLD who presents to the ED with complaints of fevers, chills, nausea but no emesis, abdominal and right flank pain with dysuria for the last 24 hours or so -No chest pains no palpitations no dizziness -Had "shob" to severe right flank pain otherwise no dyspnea -Additional history obtained from patient's 2 daughters (daughters Katelyn [name] and Katelyn [name])  at bedside  -Last BM today without diarrhea --Chest x-ray without acute finding -CT renal protocol with findings suspicious for possible pyelonephritis and chronic medullary nephrocalcinosis -UA suspicious for UTI urine culture pending -WBC 12.4 -Sodium 133, potassium 2.7, anion gap  10, creatinine is 1.41 previously normal but no recent baseline available at this time -INR 1.3, lactic acid 1.7     Hospital Course:   Brief Narrative:  71 y.o. female with past medical history relevant for HTN, DM2, and HLD admitted on 04/15/2022 with sepsis secondary to pyelonephritis/gram-negative rod UTI   A/p 1)Sepsis  secondary to E. coli UTI/Pyelonephritis---POA--- -Patient met sepsis criteria on admission -Treated with IV Rocephin and discharged on p.o. Keflex - 2)DM2-A1c 7.0 reflecting fair diabetic control PTA - restart metformin, and Jardiance   3)HTN-restart PTA BP meds including amlodipine/benazepril and metoprolol   4)HLD--- continue crestor   5)Hyponatremia/Hypokalemia----replaced,  magnesium WNL   6)AKI----acute kidney injury -due to UTI and dehydration compounded by diuretic and ACE inhibitor use -AKI resolved with medication changes and hydration     Disposition--- Home in stable condition   Dispo: The patient is from: Home              Anticipated d/c is to: Home   Code Status :  -  Code Status: Full Code    Family Communication:    NA (patient is alert, awake and  coherent)    Discharge Condition: Stable  Diet and Activity recommendation:  As advised  Discharge Instructions    Discharge Instructions     Call MD for:  difficulty breathing, headache or visual disturbances   Complete by: As directed    Call MD for:  persistant dizziness or light-headedness   Complete by: As directed    Call MD for:  persistant nausea and vomiting   Complete by: As directed    Call MD for:  severe uncontrolled pain   Complete by: As directed    Call MD for:  temperature >100.4   Complete by: As directed    Diet - low sodium heart healthy   Complete by: As directed    Diet Carb Modified   Complete by: As directed    Discharge instructions   Complete by: As directed    1)Take cephalexin/Keflex antibiotic 3 times a day for the next week as prescribed for E. coli infection in your urine and your blood  2) repeat CBC and BMP blood test with the primary care physician within the week  3) drink plenty fluids, avoid dehydration   Increase activity slowly   Complete by: As directed          Discharge Medications     Allergies as of 04/17/2022   No Known Allergies      Medication List     STOP taking these medications    lisinopril 20 MG tablet Commonly known as: ZESTRIL       TAKE these medications    acetaminophen 325 MG tablet Commonly known as: TYLENOL Take 2 tablets (650 mg total) by mouth every 6 (six) hours as needed for mild pain or fever (or Fever >/= 101).   amLODipine-benazepril 10-40 MG capsule Commonly known as: LOTREL Take 1 capsule by mouth daily.   aspirin EC 81 MG tablet Take 1 tablet (81 mg total) by mouth daily with breakfast. What changed: when to take this   cephALEXin 500 MG capsule Commonly known as: Keflex Take 1 capsule (500 mg total) by mouth 3 (three) times daily for 7 days.   cholecalciferol 1000 units tablet Commonly known as: VITAMIN D Take 1,000 Units by mouth daily.   furosemide 20 MG  tablet Commonly known as: LASIX Take 1 tablet (20 mg total) by mouth daily.   glipiZIDE 10 MG 24 hr tablet Commonly known as: GLUCOTROL XL Take 1 tablet (10 mg total) by mouth daily with breakfast.   Jardiance 25 MG Tabs tablet Generic drug: empagliflozin Take 1 tablet (25 mg total) by mouth daily.   metFORMIN 1000 MG tablet Commonly known as: GLUCOPHAGE Take 1 tablet (1,000 mg total)  by mouth 2 (two) times daily. What changed: when to take this   metoprolol succinate 50 MG 24 hr tablet Commonly known as: TOPROL-XL Take 1 tablet (50 mg total) by mouth daily.   Omega 3 1000 MG Caps Take 1 capsule by mouth daily.   oxyCODONE 5 MG immediate release tablet Commonly known as: Roxicodone Take 1 tablet (5 mg total) by mouth every 6 (six) hours as needed for severe pain.   rosuvastatin 20 MG tablet Commonly known as: CRESTOR Take 1 tablet (20 mg total) by mouth daily.   senna-docusate 8.6-50 MG tablet Commonly known as: Senokot-S Take 2 tablets by mouth at bedtime for 15 days.        Major procedures and Radiology Reports - PLEASE review detailed and final reports for all details, in brief -   CT Renal Stone Study  Result Date: 04/15/2022 CLINICAL DATA:  Flank pain, kidney stone suspected. EXAM: CT ABDOMEN AND PELVIS WITHOUT CONTRAST TECHNIQUE: Multidetector CT imaging of the abdomen and pelvis was performed following the standard protocol without IV contrast. RADIATION DOSE REDUCTION: This exam was performed according to the departmental dose-optimization program which includes automated exposure control, adjustment of the mA and/or kV according to patient size and/or use of iterative reconstruction technique. COMPARISON:  CT examination dated September 24, 2018 FINDINGS: Lower chest: Subsegmental linear atelectasis.  No acute abnormality. Hepatobiliary: No focal liver abnormality is seen. No gallstones, gallbladder wall thickening, or biliary dilatation. Pancreas: Unremarkable. No  pancreatic ductal dilatation or surrounding inflammatory changes. Spleen: Normal in size without focal abnormality. Adrenals/Urinary Tract: Adrenal glands are unremarkable. There is increased echogenicity of the renal pyramids with punctate coarse calcifications suggesting chronic medullary nephrocalcinosis, unchanged. There is mild right hydroureter and right perinephric fat stranding. There is no appreciable ureteral calculus. Multiple pelvic phleboliths. Bladder is unremarkable. Stomach/Bowel: Small hiatal hernia. Stomach is within normal limits. Appendix not clearly identified. No evidence of bowel wall thickening, distention, or inflammatory changes. Vascular/Lymphatic: Aortic atherosclerosis. No enlarged abdominal or pelvic lymph nodes. Reproductive: Uterus is present with small degenerated fibroids. Adnexa are unremarkable. Other: No abdominal wall hernia or abnormality. No abdominopelvic ascites. Musculoskeletal: No acute or significant osseous findings. IMPRESSION: 1. Echogenic renal pyramids bilaterally with punctate calcifications suggesting chronic medullary nephrocalcinosis similar to prior examination. 2. Mild right hydronephrosis and right perinephric stranding without evidence of ureteral calculus, suspicious for infectious/inflammatory process including pyelonephritis. Evaluation is limited on this noncontrast enhanced examination. Correlation with urinalysis would be helpful. 3.  Atherosclerotic disease of abdominal aorta and branch vessels. Electronically Signed   By: Keane Police D.O.   On: 04/15/2022 11:10   DG Chest Port 1 View  Result Date: 04/15/2022 CLINICAL DATA:  71 year old female with sepsis. EXAM: PORTABLE CHEST 1 VIEW COMPARISON:  No priors. FINDINGS: Lung volumes are normal. No consolidative airspace disease. No pleural effusions. No pneumothorax. No pulmonary nodule or mass noted. Pulmonary vasculature and the cardiomediastinal silhouette are within normal limits. Atherosclerosis  in the thoracic aorta. IMPRESSION: 1. No radiographic evidence of acute cardiopulmonary disease. 2. Aortic atherosclerosis. Electronically Signed   By: Vinnie Langton M.D.   On: 04/15/2022 09:00    Micro Results   Recent Results (from the past 240 hour(s))  Urine Culture     Status: Abnormal   Collection Time: 04/15/22  8:43 AM   Specimen: In/Out Cath Urine  Result Value Ref Range Status   Specimen Description   Final    IN/OUT CATH URINE Performed at [facility], [place]  [address] [place], [place] [address]    Special Requests   Final    NONE Performed at [facility], [address] [place], [place] [address]    Culture 20,000 COLONIES/mL ESCHERICHIA COLI (A)  Final   Report Status 04/17/2022 FINAL  Final   Organism ID, Bacteria ESCHERICHIA COLI (A)  Final      Susceptibility   Escherichia coli - MIC*    AMPICILLIN >=32 RESISTANT Resistant     CEFAZOLIN <=4 SENSITIVE Sensitive     CEFEPIME <=0.12 SENSITIVE Sensitive     CEFTRIAXONE <=0.25 SENSITIVE Sensitive     CIPROFLOXACIN >=4 RESISTANT Resistant     GENTAMICIN <=1 SENSITIVE Sensitive     IMIPENEM <=0.25 SENSITIVE Sensitive     NITROFURANTOIN <=16 SENSITIVE Sensitive     TRIMETH/SULFA <=20 SENSITIVE Sensitive     AMPICILLIN/SULBACTAM 16 INTERMEDIATE Intermediate     PIP/TAZO <=4 SENSITIVE Sensitive     * 20,000 COLONIES/mL ESCHERICHIA COLI  Resp Panel by RT-PCR (Flu A&B, Covid) Anterior Nasal Swab     Status: None   Collection Time: 04/15/22  8:48 AM   Specimen: Anterior Nasal Swab  Result Value Ref Range Status   SARS Coronavirus 2 by RT PCR NEGATIVE NEGATIVE Final    Comment: (NOTE) SARS-CoV-2 target nucleic acids are NOT DETECTED.  The SARS-CoV-2 RNA is generally detectable in upper respiratory specimens during the acute phase of infection. The lowest concentration of SARS-CoV-2 viral copies this assay can detect is 138 copies/mL. A negative result does not preclude SARS-Cov-2 infection and should not be used as  the sole basis for treatment or other patient management decisions. A negative result may occur with  improper specimen collection/handling, submission of specimen other than nasopharyngeal swab, presence of viral mutation(s) within the areas targeted by this assay, and inadequate number of viral copies(<138 copies/mL). A negative result must be combined with clinical observations, patient history, and epidemiological information. The expected result is Negative.  Fact Sheet for Patients:  EntrepreneurPulse.com.au  Fact Sheet for Healthcare Providers:  IncredibleEmployment.be  This test is no t yet approved or cleared by the [place] FDA and  has been authorized for detection and/or diagnosis of SARS-CoV-2 by FDA under an Emergency Use Authorization (EUA). This EUA will remain  in effect (meaning this test can be used) for the duration of the COVID-19 declaration under Section 564(b)(1) of the Act, 21 U.S.C.section 360bbb-3(b)(1), unless the authorization is terminated  or revoked sooner.       Influenza A by PCR NEGATIVE NEGATIVE Final   Influenza B by PCR NEGATIVE NEGATIVE Final    Comment: (NOTE) The Xpert Xpress SARS-CoV-2/FLU/RSV plus assay is intended as an aid in the diagnosis of influenza from Nasopharyngeal swab specimens and should not be used as a sole basis for treatment. Nasal washings and aspirates are unacceptable for Xpert Xpress SARS-CoV-2/FLU/RSV testing.  Fact Sheet for Patients: EntrepreneurPulse.com.au  Fact Sheet for Healthcare Providers: IncredibleEmployment.be  This test is not yet approved or cleared by the [place] FDA and has been authorized for detection and/or diagnosis of SARS-CoV-2 by FDA under an Emergency Use Authorization (EUA). This EUA will remain in effect (meaning this test can be used) for the duration of the COVID-19 declaration under Section 564(b)(1) of  the Act, 21 U.S.C. section 360bbb-3(b)(1), unless the authorization is terminated or revoked.  Performed at [facility], [address], [place],  [address]   Culture, blood (Routine x 2)     Status: None (Preliminary result)  Collection Time: 04/15/22  8:53 AM   Specimen: BLOOD RIGHT ARM  Result Value Ref Range Status   Specimen Description   Final    BLOOD RIGHT ARM Performed at [facility], [address] [place], [place] [address]    Special Requests   Final    BOTTLES DRAWN AEROBIC AND ANAEROBIC Blood Culture results may not be optimal due to an excessive volume of blood received in culture bottles Performed at [place] [place], [place] [address]    Culture  Setup Time   Final    GRAM NEGATIVE RODS AEROBIC BOTTLE Gram Stain Report Called to,Read Back By and Verified With: BROWN,B_0  BY MATTHEWS, B 6.29.2023 CRITICAL RESULT CALLED TO, READ BACK BY AND VERIFIED WITH: SCOTT HALL PHARMD _1  04/16/22 E,[place] Performed at [facility], [place] [address], [place], [place] [address]    Culture GRAM NEGATIVE RODS  Final   Report Status PENDING  Incomplete  Blood Culture ID Panel (Reflexed)     Status: Abnormal   Collection Time: 04/15/22  8:53 AM  Result Value Ref Range Status   Enterococcus faecalis NOT DETECTED NOT DETECTED Final   Enterococcus Faecium NOT DETECTED NOT DETECTED Final   Listeria monocytogenes NOT DETECTED NOT DETECTED Final   Staphylococcus species NOT DETECTED NOT DETECTED Final   Staphylococcus aureus (BCID) NOT DETECTED NOT DETECTED Final   Staphylococcus epidermidis NOT DETECTED NOT DETECTED Final   Staphylococcus lugdunensis NOT DETECTED NOT DETECTED Final   Streptococcus species NOT DETECTED NOT DETECTED Final   Streptococcus agalactiae NOT DETECTED NOT DETECTED Final   Streptococcus pneumoniae NOT DETECTED NOT DETECTED Final   Streptococcus pyogenes NOT DETECTED NOT DETECTED Final   A.calcoaceticus-baumannii NOT DETECTED  NOT DETECTED Final   Bacteroides fragilis NOT DETECTED NOT DETECTED Final   Enterobacterales DETECTED (A) NOT DETECTED Final    Comment: Enterobacterales represent a large order of gram negative bacteria, not a single organism. CRITICAL RESULT CALLED TO, READ BACK BY AND VERIFIED WITH: SCOTT HALL PHARMD _2  04/16/22 E,[place]    Enterobacter cloacae complex NOT DETECTED NOT DETECTED Final   Escherichia coli DETECTED (A) NOT DETECTED Final    Comment: CRITICAL RESULT CALLED TO, READ BACK BY AND VERIFIED WITH: SCOTT HALL PHARMD _3  04/16/22 E,[place]    Klebsiella aerogenes NOT DETECTED NOT DETECTED Final   Klebsiella oxytoca NOT DETECTED NOT DETECTED Final   Klebsiella pneumoniae NOT DETECTED NOT DETECTED Final   Proteus species NOT DETECTED NOT DETECTED Final   Salmonella species NOT DETECTED NOT DETECTED Final   Serratia marcescens NOT DETECTED NOT DETECTED Final   Haemophilus influenzae NOT DETECTED NOT DETECTED Final   Neisseria meningitidis NOT DETECTED NOT DETECTED Final   Pseudomonas aeruginosa NOT DETECTED NOT DETECTED Final   Stenotrophomonas maltophilia NOT DETECTED NOT DETECTED Final   Candida albicans NOT DETECTED NOT DETECTED Final   Candida auris NOT DETECTED NOT DETECTED Final   Candida glabrata NOT DETECTED NOT DETECTED Final   Candida krusei NOT DETECTED NOT DETECTED Final   Candida parapsilosis NOT DETECTED NOT DETECTED Final   Candida tropicalis NOT DETECTED NOT DETECTED Final   Cryptococcus neoformans/gattii NOT DETECTED NOT DETECTED Final   CTX-M ESBL NOT DETECTED NOT DETECTED Final   Carbapenem resistance IMP NOT DETECTED NOT DETECTED Final   Carbapenem resistance KPC NOT DETECTED NOT DETECTED Final   Carbapenem resistance NDM NOT DETECTED NOT DETECTED Final   Carbapenem resist OXA 48 LIKE NOT DETECTED NOT DETECTED Final   Carbapenem resistance VIM  NOT DETECTED NOT DETECTED Final    Comment: Performed at [facility], [place] [address] [place], [place]  [address]  Culture, blood (Routine x 2)     Status: None (Preliminary result)   Collection Time: 04/15/22  8:54 AM   Specimen: BLOOD LEFT WRIST  Result Value Ref Range Status   Specimen Description BLOOD LEFT WRIST  Final   Special Requests   Final    BOTTLES DRAWN AEROBIC AND ANAEROBIC Blood Culture results may not be optimal due to an inadequate volume of blood received in culture bottles   Culture   Final    NO GROWTH 2 DAYS Performed at [facility], [address], [place], [place] [address]    Report Status PENDING  Incomplete    Today   Subjective    Katelyn [name] today has no new complaints -Eating and drinking well -No fever no chills no nausea no vomiting          Patient has been seen and examined prior to discharge   Objective   Blood pressure (!) 152/87, pulse 69, temperature 98.9 F (37.2 C), resp. rate 19, height _0  (1.702 m), weight 84.4 kg, SpO2 96 %.   Intake/Output Summary (Last 24 hours) at 04/17/2022 1037 Last data filed at 04/17/2022 0900 Gross per 24 hour  Intake 820 ml  Output --  Net 820 ml    Exam Gen:- Awake Alert, no acute distress  HEENT:- [place].AT, No sclera icterus Neck-Supple Neck,No JVD,.  Lungs-  CTAB , good air movement bilaterally CV- S1, S2 normal, regular Abd-  +ve B.Sounds, Abd Soft, No tenderness, resolved CVA area tenderness    Extremity/Skin:- No  edema,   good pulses Psych-affect is appropriate, oriented x3 Neuro-no new focal deficits, no tremors    Data Review   CBC w Diff:  Lab Results  Component Value Date   WBC 9.5 04/16/2022   HGB 11.7 (L) 04/16/2022   HCT 37.4 04/16/2022   PLT 242 04/16/2022   LYMPHOPCT 7 04/15/2022   MONOPCT 6 04/15/2022   EOSPCT 0 04/15/2022   BASOPCT 0 04/15/2022    CMP:  Lab Results  Component Value Date   NA 142 04/17/2022   K 3.7 04/17/2022   CL 112 (H) 04/17/2022   CO2 22 04/17/2022   BUN 14 04/17/2022   CREATININE 1.14 (H) 04/17/2022   PROT 7.8 04/15/2022   ALBUMIN 3.2 (L)  04/15/2022   BILITOT 0.4 04/15/2022   ALKPHOS 100 04/15/2022   AST 31 04/15/2022   ALT 31 04/15/2022  .  Total Discharge time is about 33 minutes  Roxan Hockey M.D on 04/17/2022 at 10:37 AM  Go to [email or web address] -  for contact info  [facility] - Office  [phone]

## 2022-04-17 NOTE — Plan of Care (Signed)

## 2022-04-19 LAB — CULTURE, BLOOD (ROUTINE X 2)

## 2022-04-20 LAB — CULTURE, BLOOD (ROUTINE X 2): Culture: NO GROWTH

## 2022-04-30 DIAGNOSIS — E876 Hypokalemia: Secondary | ICD-10-CM | POA: Diagnosis not present

## 2022-04-30 DIAGNOSIS — E1122 Type 2 diabetes mellitus with diabetic chronic kidney disease: Secondary | ICD-10-CM | POA: Diagnosis not present

## 2022-04-30 DIAGNOSIS — Z6828 Body mass index (BMI) 28.0-28.9, adult: Secondary | ICD-10-CM | POA: Diagnosis not present

## 2022-04-30 DIAGNOSIS — E663 Overweight: Secondary | ICD-10-CM | POA: Diagnosis not present

## 2022-04-30 DIAGNOSIS — A4151 Sepsis due to Escherichia coli [E. coli]: Secondary | ICD-10-CM | POA: Diagnosis not present

## 2022-04-30 DIAGNOSIS — N10 Acute tubulo-interstitial nephritis: Secondary | ICD-10-CM | POA: Diagnosis not present

## 2022-05-07 DIAGNOSIS — N179 Acute kidney failure, unspecified: Secondary | ICD-10-CM | POA: Diagnosis not present

## 2022-05-15 ENCOUNTER — Ambulatory Visit
Admission: RE | Admit: 2022-05-15 | Discharge: 2022-05-15 | Disposition: A | Discharge status: Home / Self Care | Payer: Medicare HMO | Source: Ambulatory Visit | Attending: Family Medicine | Admitting: Family Medicine

## 2022-05-15 DIAGNOSIS — Z1231 Encounter for screening mammogram for malignant neoplasm of breast: Secondary | ICD-10-CM | POA: Diagnosis not present

## 2022-06-03 DIAGNOSIS — N390 Urinary tract infection, site not specified: Secondary | ICD-10-CM | POA: Diagnosis not present

## 2022-06-03 DIAGNOSIS — Z6828 Body mass index (BMI) 28.0-28.9, adult: Secondary | ICD-10-CM | POA: Diagnosis not present

## 2022-06-29 DIAGNOSIS — N390 Urinary tract infection, site not specified: Secondary | ICD-10-CM | POA: Diagnosis not present

## 2022-06-29 DIAGNOSIS — E6609 Other obesity due to excess calories: Secondary | ICD-10-CM | POA: Diagnosis not present

## 2022-06-29 DIAGNOSIS — Z6828 Body mass index (BMI) 28.0-28.9, adult: Secondary | ICD-10-CM | POA: Diagnosis not present

## 2022-07-22 DIAGNOSIS — N390 Urinary tract infection, site not specified: Secondary | ICD-10-CM | POA: Diagnosis not present

## 2022-07-22 DIAGNOSIS — E663 Overweight: Secondary | ICD-10-CM | POA: Diagnosis not present

## 2022-07-22 DIAGNOSIS — Z6828 Body mass index (BMI) 28.0-28.9, adult: Secondary | ICD-10-CM | POA: Diagnosis not present

## 2022-08-13 DIAGNOSIS — N179 Acute kidney failure, unspecified: Secondary | ICD-10-CM | POA: Diagnosis not present

## 2022-08-13 DIAGNOSIS — Z6828 Body mass index (BMI) 28.0-28.9, adult: Secondary | ICD-10-CM | POA: Diagnosis not present

## 2022-08-13 DIAGNOSIS — N390 Urinary tract infection, site not specified: Secondary | ICD-10-CM | POA: Diagnosis not present

## 2022-08-13 DIAGNOSIS — E663 Overweight: Secondary | ICD-10-CM | POA: Diagnosis not present

## 2022-09-02 DIAGNOSIS — N8110 Cystocele, unspecified: Secondary | ICD-10-CM | POA: Diagnosis not present

## 2022-09-02 DIAGNOSIS — E1122 Type 2 diabetes mellitus with diabetic chronic kidney disease: Secondary | ICD-10-CM | POA: Diagnosis not present

## 2022-09-02 DIAGNOSIS — I10 Essential (primary) hypertension: Secondary | ICD-10-CM | POA: Diagnosis not present

## 2022-09-02 DIAGNOSIS — E663 Overweight: Secondary | ICD-10-CM | POA: Diagnosis not present

## 2022-09-02 DIAGNOSIS — N390 Urinary tract infection, site not specified: Secondary | ICD-10-CM | POA: Diagnosis not present

## 2022-09-02 DIAGNOSIS — Z6829 Body mass index (BMI) 29.0-29.9, adult: Secondary | ICD-10-CM | POA: Diagnosis not present

## 2022-09-25 DIAGNOSIS — Z6829 Body mass index (BMI) 29.0-29.9, adult: Secondary | ICD-10-CM | POA: Diagnosis not present

## 2022-09-25 DIAGNOSIS — I10 Essential (primary) hypertension: Secondary | ICD-10-CM | POA: Diagnosis not present

## 2022-09-25 DIAGNOSIS — E782 Mixed hyperlipidemia: Secondary | ICD-10-CM | POA: Diagnosis not present

## 2022-09-25 DIAGNOSIS — N390 Urinary tract infection, site not specified: Secondary | ICD-10-CM | POA: Diagnosis not present

## 2022-09-25 DIAGNOSIS — E663 Overweight: Secondary | ICD-10-CM | POA: Diagnosis not present

## 2022-09-25 DIAGNOSIS — N181 Chronic kidney disease, stage 1: Secondary | ICD-10-CM | POA: Diagnosis not present

## 2022-09-25 DIAGNOSIS — E1122 Type 2 diabetes mellitus with diabetic chronic kidney disease: Secondary | ICD-10-CM | POA: Diagnosis not present

## 2022-09-25 DIAGNOSIS — E213 Hyperparathyroidism, unspecified: Secondary | ICD-10-CM | POA: Diagnosis not present

## 2022-09-25 DIAGNOSIS — E210 Primary hyperparathyroidism: Secondary | ICD-10-CM | POA: Diagnosis not present

## 2022-11-06 ENCOUNTER — Encounter: Payer: Self-pay | Admitting: *Deleted

## 2022-12-17 ENCOUNTER — Telehealth (INDEPENDENT_AMBULATORY_CARE_PROVIDER_SITE_OTHER): Payer: Self-pay | Admitting: *Deleted

## 2022-12-17 NOTE — Telephone Encounter (Signed)
Procedure: colonoscopy  Height: 5 7 Weight: 190lbs       Estimated body mass index is 29.13 kg/m as calculated from the following:   Height as of 04/15/22: '5\' 7"'$  (1.702 m).   Weight as of 04/15/22: 186 lb (84.4 kg).   Have you had a colonoscopy before?  Yes 12/01/17, Dr. Dudley Major  Do you have family history of colon cancer?  Tes, father, brother  Do you have a family history of polyps? yes  Previous colonoscopy with polyps removed? yes  Do you have a history colorectal cancer?   no  Are you diabetic?  yes  Do you have a prosthetic or mechanical heart valve? no  Do you have a pacemaker/defibrillator?   no  Have you had endocarditis/atrial fibrillation?  no  Do you use supplemental oxygen/CPAP?  no  Have you had joint replacement within the last 12 months?  no  Do you tend to be constipated or have to use laxatives?  yes   Do you have history of alcohol use? If yes, how much and how often.  no  Do you have history or are you using drugs? If yes, what do are you  using?  no  Have you ever had a stroke/heart attack?  no  Have you ever had a heart or other vascular stent placed,?no  Do you take weight loss medication? no  female patients,: have you had a hysterectomy? no                              are you post menopausal?  yes                              do you still have your menstrual cycle? no  Do you take any blood-thinning medications such as: (Plavix, aspirin, Coumadin, Aggrenox, Brilinta, Xarelto, Eliquis, Pradaxa, Savaysa or Effient)? Yes aspirin '81mg'$   If yes we need the name, milligram, dosage and who is prescribing doctor:               Current Outpatient Medications  Medication Sig Dispense Refill   acetaminophen (TYLENOL) 325 MG tablet Take 2 tablets (650 mg total) by mouth every 6 (six) hours as needed for mild pain or fever (or Fever >/= 101). 15 tablet 0   amLODipine-benazepril (LOTREL) 10-40 MG capsule Take 1 capsule by mouth daily. 90 capsule 3    aspirin EC 81 MG tablet Take 1 tablet (81 mg total) by mouth daily with breakfast. 100 tablet 3   cholecalciferol (VITAMIN D) 1000 units tablet Take 1,000 Units by mouth daily.     furosemide (LASIX) 20 MG tablet Take 1 tablet (20 mg total) by mouth daily. 90 tablet 2   glipiZIDE (GLUCOTROL XL) 10 MG 24 hr tablet Take 1 tablet (10 mg total) by mouth daily with breakfast. 90 tablet 3   JARDIANCE 25 MG TABS tablet Take 1 tablet (25 mg total) by mouth daily. 90 tablet 3   metFORMIN (GLUCOPHAGE) 1000 MG tablet Take 1 tablet (1,000 mg total) by mouth 2 (two) times daily. 60 tablet 5   metoprolol succinate (TOPROL-XL) 50 MG 24 hr tablet Take 1 tablet (50 mg total) by mouth daily. 90 tablet 3   OMEGA 3 1000 MG CAPS Take 1 capsule by mouth daily.     oxyCODONE (ROXICODONE) 5 MG immediate release tablet Take 1 tablet (5 mg total)  by mouth every 6 (six) hours as needed for severe pain. 10 tablet 0   rosuvastatin (CRESTOR) 20 MG tablet Take 1 tablet (20 mg total) by mouth daily. 90 tablet 3   No current facility-administered medications for this visit.    No Known Allergies

## 2022-12-20 NOTE — Telephone Encounter (Signed)
Report of constipation on triage form. Need office visit to discuss.

## 2022-12-25 DIAGNOSIS — Z1331 Encounter for screening for depression: Secondary | ICD-10-CM | POA: Diagnosis not present

## 2022-12-25 DIAGNOSIS — E1122 Type 2 diabetes mellitus with diabetic chronic kidney disease: Secondary | ICD-10-CM | POA: Diagnosis not present

## 2022-12-25 DIAGNOSIS — I10 Essential (primary) hypertension: Secondary | ICD-10-CM | POA: Diagnosis not present

## 2022-12-25 DIAGNOSIS — E213 Hyperparathyroidism, unspecified: Secondary | ICD-10-CM | POA: Diagnosis not present

## 2022-12-25 DIAGNOSIS — Z0001 Encounter for general adult medical examination with abnormal findings: Secondary | ICD-10-CM | POA: Diagnosis not present

## 2022-12-25 DIAGNOSIS — Z6829 Body mass index (BMI) 29.0-29.9, adult: Secondary | ICD-10-CM | POA: Diagnosis not present

## 2022-12-25 DIAGNOSIS — N390 Urinary tract infection, site not specified: Secondary | ICD-10-CM | POA: Diagnosis not present

## 2022-12-25 DIAGNOSIS — E210 Primary hyperparathyroidism: Secondary | ICD-10-CM | POA: Diagnosis not present

## 2022-12-25 DIAGNOSIS — N8110 Cystocele, unspecified: Secondary | ICD-10-CM | POA: Diagnosis not present

## 2023-01-04 NOTE — Progress Notes (Signed)
GI Office Note    Referring Provider: Jake Samples, PA* Primary Care Physician:  Scherrie Bateman  Primary Gastroenterologist: Cristopher Estimable.Rourk, MD  Chief Complaint   No chief complaint on file.   History of Present Illness   Katelyn [name] is a 72 y.o. female presenting today at the request of Jake Samples, PA*to discuss constipation prior to scheduling colonoscopy.  Last office visit February 2019. History of colon cancer in her brother and father.  She stated a negative colonoscopy in the past.  Patient is scheduled for a high risk screening colonoscopy.  Colonoscopy February 2019: -4 mm polyp in the hepatic flexure s/p resection and ablation -Nonbleeding grade 2 internal hemorrhoids -Advise repeat colonoscopy in 5 years  Today: Constipation -    Current Outpatient Medications  Medication Sig Dispense Refill   acetaminophen (TYLENOL) 325 MG tablet Take 2 tablets (650 mg total) by mouth every 6 (six) hours as needed for mild pain or fever (or Fever >/= 101). 15 tablet 0   amLODipine-benazepril (LOTREL) 10-40 MG capsule Take 1 capsule by mouth daily. 90 capsule 3   aspirin EC 81 MG tablet Take 1 tablet (81 mg total) by mouth daily with breakfast. 100 tablet 3   cholecalciferol (VITAMIN D) 1000 units tablet Take 1,000 Units by mouth daily.     furosemide (LASIX) 20 MG tablet Take 1 tablet (20 mg total) by mouth daily. 90 tablet 2   glipiZIDE (GLUCOTROL XL) 10 MG 24 hr tablet Take 1 tablet (10 mg total) by mouth daily with breakfast. 90 tablet 3   JARDIANCE 25 MG TABS tablet Take 1 tablet (25 mg total) by mouth daily. 90 tablet 3   metFORMIN (GLUCOPHAGE) 1000 MG tablet Take 1 tablet (1,000 mg total) by mouth 2 (two) times daily. 60 tablet 5   metoprolol succinate (TOPROL-XL) 50 MG 24 hr tablet Take 1 tablet (50 mg total) by mouth daily. 90 tablet 3   OMEGA 3 1000 MG CAPS Take 1 capsule by mouth daily.     oxyCODONE (ROXICODONE) 5 MG immediate release  tablet Take 1 tablet (5 mg total) by mouth every 6 (six) hours as needed for severe pain. 10 tablet 0   rosuvastatin (CRESTOR) 20 MG tablet Take 1 tablet (20 mg total) by mouth daily. 90 tablet 3   No current facility-administered medications for this visit.    Past Medical History:  Diagnosis Date   Acute meniscal tear of knee    RIGHT   Arthritis    BACK   Diabetes mellitus, type 2 ([place])    Hypercholesteremia    Hypertension    Parathyroid disorder [facility])     Past Surgical History:  Procedure Laterality Date   COLONOSCOPY     COLONOSCOPY N/A 12/01/2017   Procedure: COLONOSCOPY;  Surgeon: Daneil Dolin, MD;  [facility];  Service: Endoscopy;  Laterality: N/A;  10:45   KNEE ARTHROSCOPY WITH LATERAL MENISECTOMY Right 02/16/2013   Procedure: RIGHT KNEE  ARTHROSCOPY WITH PARTIAL LATERAL MENISECTOMY; Excision of plica with shaving of patella,  partial medial menisectomy;  Surgeon: Magnus Sinning, MD;  Location: [place];  Service: Orthopedics;  Laterality: Right;   POLYPECTOMY  12/01/2017   Procedure: POLYPECTOMY;  Surgeon: Daneil Dolin, MD;  [facility];  Service: Endoscopy;;    Family History  Problem Relation Age of Onset   Stroke Mother    Colon cancer Father    Diabetes Father  Breast cancer Daughter 15   Colon cancer Brother    Hyperparathyroidism Neg Hx     Allergies as of 01/05/2023   (No Known Allergies)    Social History   Socioeconomic History   Marital status: Divorced    Spouse name: Not on file   Number of children: Not on file   Years of education: Not on file   Highest education level: Not on file  Occupational History   Not on file  Tobacco Use   Smoking status: Never   Smokeless tobacco: Never  Vaping Use   Vaping Use: Never used  Substance and Sexual Activity   Alcohol use: No   Drug use: No   Sexual activity: Not on file  Other Topics Concern   Not on file  Social History Narrative   Not on  file   Social Determinants of Health   Financial Resource Strain: Not on file  Food Insecurity: Not on file  Transportation Needs: Not on file  Physical Activity: Not on file  Stress: Not on file  Social Connections: Not on file  Intimate Partner Violence: Not on file     Review of Systems   Gen: Denies any fever, chills, fatigue, weight loss, lack of appetite.  CV: Denies chest pain, heart palpitations, peripheral edema, syncope.  Resp: Denies shortness of breath at rest or with exertion. Denies wheezing or cough.  GI: see HPI GU : Denies urinary burning, urinary frequency, urinary hesitancy MS: Denies joint pain, muscle weakness, cramps, or limitation of movement.  Derm: Denies rash, itching, dry skin Psych: Denies depression, anxiety, memory loss, and confusion Heme: Denies bruising, bleeding, and enlarged lymph nodes.   Physical Exam   There were no vitals taken for this visit.  General:   Alert and oriented. Pleasant and cooperative. Well-nourished and well-developed.  Head:  Normocephalic and atraumatic. Eyes:  Without icterus, sclera clear and conjunctiva pink.  Ears:  Normal auditory acuity. Mouth:  No deformity or lesions, oral mucosa pink.  Lungs:  Clear to auscultation bilaterally. No wheezes, rales, or rhonchi. No distress.  Heart:  S1, S2 present without murmurs appreciated.  Abdomen:  +BS, soft, non-tender and non-distended. No HSM noted. No guarding or rebound. No masses appreciated.  Rectal:  Deferred  Msk:  Symmetrical without gross deformities. Normal posture. Extremities:  Without edema. Neurologic:  Alert and  oriented x4;  grossly normal neurologically. Skin:  Intact without significant lesions or rashes. Psych:  Alert and cooperative. Normal mood and affect.   Assessment   Katelyn [name] is a 72 y.o. female with a history of diabetes, hypercholesteremia, HTN, parathyroid disorder, and arthritis*** presenting today for evaluation of  constipation prior to scheduling colonoscopy.  Family history of colon cancer, history of colon polyps:    PLAN   *** Proceed with colonoscopy with propofol by Dr. Gala Romney in near future: the risks, benefits, and alternatives have been discussed with the patient in detail. The patient states understanding and desires to proceed.    Venetia Night, MSN, FNP-BC, AGACNP-BC [facility]

## 2023-01-04 NOTE — H&P (View-Only) (Signed)
GI Office Note    Referring Provider: Jake Samples, PA* Primary Care Physician:  Scherrie Bateman  Primary Gastroenterologist: Cristopher Estimable.Rourk, MD  Chief Complaint   Chief Complaint  Patient presents with   Constipation    Referred for constipation. Not taking anything on a regular basis. Takes dulcolax as needed or fleet enema. Would like to schedule colonoscopy.    History of Present Illness   Katelyn [name] is a 72 y.o. female presenting today at the request of Jake Samples, PA*to discuss constipation prior to scheduling colonoscopy.  Last office visit February 2019. History of colon cancer in her brother and father.  She stated a negative colonoscopy in the past.  Patient is scheduled for a high risk screening colonoscopy.  Colonoscopy February 2019: -4 mm polyp in the hepatic flexure s/p resection and ablation -Nonbleeding grade 2 internal hemorrhoids -Advise repeat colonoscopy in 5 years  Today: Constipation - Off and on constipation for a long time. Taking oral dulcolax if she goes a few days without a BM, sometimes a whole week. Does not take on a regular basis. Does not have to use and enema very often. Has a BM maybe once to twice per week. No abdominal pain. Recently has been having moe gas. Sometimes it is just hard to go and has to strain. Does have hemorrhoids. Not currently bothering her. Has some skin tags as well. Does not have any flares recently. No burning, itching, bleeding.   No melena, chest pain, shortness of breath, lack of appetite, unintentional weight loss. No reflux symptoms or dysphagia, N/V.   History of colon cancer in her brother and father. Brother passed 1 year after diagnosis. Father passed from other sickness.   Was taken off Jardiance last year in November or so. Reports she was in the hospital for urosepsis and was suspected to be related to the [place]. Blood sugars are up and down depending on her diet. She has  quit drinking Pepsi on a regular basis but does drink 1 a day.   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 Works in [place].    Current Outpatient Medications  Medication Sig Dispense Refill   ACCU-CHEK AVIVA PLUS test strip 1 each daily.     amLODipine-benazepril (LOTREL) 10-40 MG capsule Take 1 capsule by mouth daily. 90 capsule 3   aspirin EC 81 MG tablet Take 1 tablet (81 mg total) by mouth daily with breakfast. 100 tablet 3   cholecalciferol (VITAMIN D) 1000 units tablet Take 1,000 Units by mouth daily.     furosemide (LASIX) 20 MG tablet Take 1 tablet (20 mg total) by mouth daily. 90 tablet 2   glipiZIDE (GLUCOTROL XL) 10 MG 24 hr tablet Take 1 tablet (10 mg total) by mouth daily with breakfast. 90 tablet 3   metFORMIN (GLUCOPHAGE) 1000 MG tablet Take 1 tablet (1,000 mg total) by mouth 2 (two) times daily. 60 tablet 5   metoprolol succinate (TOPROL-XL) 50 MG 24 hr tablet Take 1 tablet (50 mg total) by mouth daily. 90 tablet 3   OMEGA 3 1000 MG CAPS Take 1 capsule by mouth daily.     rosuvastatin (CRESTOR) 20 MG tablet Take 1 tablet (20 mg total) by mouth daily. 90 tablet 3   No current facility-administered medications for this visit.    Past Medical History:  Diagnosis Date   Acute meniscal tear of knee    RIGHT   Arthritis  BACK   Diabetes mellitus, type 2 ([place])    Hypercholesteremia    Hypertension    Parathyroid disorder [facility])     Past Surgical History:  Procedure Laterality Date   CATARACT EXTRACTION Right    COLONOSCOPY     COLONOSCOPY N/A 12/01/2017   Procedure: COLONOSCOPY;  Surgeon: Daneil Dolin, MD;  [facility];  Service: Endoscopy;  Laterality: N/A;  10:45   KNEE ARTHROSCOPY WITH LATERAL MENISECTOMY Right 02/16/2013   Procedure: RIGHT KNEE  ARTHROSCOPY WITH PARTIAL LATERAL MENISECTOMY; Excision of plica with shaving of patella,  partial medial menisectomy;  Surgeon: Magnus Sinning, MD;  Location: [place];   Service: Orthopedics;  Laterality: Right;   POLYPECTOMY  12/01/2017   Procedure: POLYPECTOMY;  Surgeon: Daneil Dolin, MD;  [facility];  Service: Endoscopy;;    Family History  Problem Relation Age of Onset   Stroke Mother    Colon cancer Father    Diabetes Father    Breast cancer Daughter 26   Colon cancer Brother    Hyperparathyroidism Neg Hx     Allergies as of 01/05/2023   (No Known Allergies)    Social History   Socioeconomic History   Marital status: Divorced    Spouse name: Not on file   Number of children: Not on file   Years of education: Not on file   Highest education level: Not on file  Occupational History   Not on file  Tobacco Use   Smoking status: Never    Passive exposure: Past   Smokeless tobacco: Never  Vaping Use   Vaping Use: Never used  Substance and Sexual Activity   Alcohol use: No   Drug use: No   Sexual activity: Not on file  Other Topics Concern   Not on file  Social History Narrative   Not on file   Social Determinants of Health   Financial Resource Strain: Not on file  Food Insecurity: Not on file  Transportation Needs: Not on file  Physical Activity: Not on file  Stress: Not on file  Social Connections: Not on file  Intimate Partner Violence: Not on file     Review of Systems   Gen: Denies any fever, chills, fatigue, weight loss, lack of appetite.  CV: Denies chest pain, heart palpitations, peripheral edema, syncope.  Resp: Denies shortness of breath at rest or with exertion. Denies wheezing or cough.  GI: see HPI GU : Denies urinary burning, urinary frequency, urinary hesitancy MS: Denies joint pain, muscle weakness, cramps, or limitation of movement.  Derm: Denies rash, itching, dry skin Psych: Denies depression, anxiety, memory loss, and confusion Heme: Denies bruising, bleeding, and enlarged lymph nodes.   Physical Exam   BP (!) 161/93 (BP Location: Right Arm, Patient Position: Sitting, Cuff Size:  Large)   Pulse 81   Temp 97.9 F (36.6 C) (Oral)   Ht 5\' 7"  (1.702 m)   Wt 197 lb (89.4 kg)   BMI 30.85 kg/m   General:   Alert and oriented. Pleasant and cooperative. Well-nourished and well-developed.  Head:  Normocephalic and atraumatic. Eyes:  Without icterus, sclera clear and conjunctiva pink.  Ears:  Normal auditory acuity. Mouth:  No deformity or lesions, oral mucosa pink.  Lungs:  Clear to auscultation bilaterally. No wheezes, rales, or rhonchi. No distress.  Heart:  S1, S2 present without murmurs appreciated.  Abdomen:  +BS, soft, non-tender and non-distended. No HSM noted. No guarding or rebound. No  masses appreciated.  Rectal:  Deferred  Msk:  Symmetrical without gross deformities. Normal posture. Extremities:  Without edema. Neurologic:  Alert and  oriented x4;  grossly normal neurologically. Skin:  Intact without significant lesions or rashes. Psych:  Alert and cooperative. Normal mood and affect.   Assessment   Katelyn [name] is a 72 y.o. female with a history of diabetes, hypercholesteremia, HTN, parathyroid disorder, and arthritis presenting today for evaluation of constipation prior to scheduling colonoscopy.  Family history of colon cancer, history of colon polyps: Family history of colon cancer in her brother and her father.  She is unsure what age her brother was diagnosed but reports he passed away within a year of diagnosis.  She states her father also had a history of colon cancer but passed from other illness.  Her last colonoscopy was in February 2019 with colon polyps.  Currently without any alarm symptoms.  Will proceed with surveillance colonoscopy in the near future.  Constipation: Currently having a bowel movement once or twice per week.  Occasionally may go a little over a week without a bowel movement.  Does not take anything on a regular basis for constipation.  Does use an enema rarely as needed.  Usually if she goes more than a week without a bowel  movement she will take Dulcolax and then have a better bowel movement.  Does have some occasional bloating and gas but denies any abdominal pain.  Advised to begin daily fiber supplement as well as MiraLAX once daily or every other day for more frequent bowel movements.  Advised her that we need to do this to ensure good colonoscopy prep.  Hemorrhoids: Does note history of hemorrhoids but not currently having any flares.  Denies any BRBPR.  PLAN   Proceed with colonoscopy with propofol by Dr. Gala Romney in near future: the risks, benefits, and alternatives have been discussed with the patient in detail. The patient states understanding and desires to proceed. ASA 2 Hold metformin and glipizide night prior to the morning of procedure BMP pre op Daily fiber supplement such as benefiber Miralax 17g once daily or every other day.  Follow up in 4 months.    Venetia Night, MSN, FNP-BC, AGACNP-BC [facility]

## 2023-01-05 ENCOUNTER — Ambulatory Visit (INDEPENDENT_AMBULATORY_CARE_PROVIDER_SITE_OTHER): Payer: Medicare HMO | Admitting: Gastroenterology

## 2023-01-05 ENCOUNTER — Encounter: Payer: Self-pay | Admitting: Gastroenterology

## 2023-01-05 VITALS — BP 161/93 | HR 81 | Temp 97.9°F | Ht 67.0 in | Wt 197.0 lb

## 2023-01-05 DIAGNOSIS — Z800 Family history of malignant neoplasm of digestive organs: Secondary | ICD-10-CM | POA: Diagnosis not present

## 2023-01-05 DIAGNOSIS — K649 Unspecified hemorrhoids: Secondary | ICD-10-CM

## 2023-01-05 DIAGNOSIS — Z86010 Personal history of colonic polyps: Secondary | ICD-10-CM

## 2023-01-05 DIAGNOSIS — K5900 Constipation, unspecified: Secondary | ICD-10-CM

## 2023-01-05 NOTE — Patient Instructions (Signed)
We are getting you scheduled for colonoscopy in the near future Dr. Gala Romney..  You received separate detailed written instructions regarding your prep.  In summary you will need to hold your metformin and your glipizide the night prior to and the morning of your procedure.  To help you with your constipation I would recommend that you start taking MiraLAX 1 capful daily or every other day as well as a daily fiber supplement.  You can take Benefiber 2-3 teaspoons daily or an over-the-counter fiber capsule/tablet.  We will plan to follow-up in 4 months, sooner if needed to see how you are doing with your constipation.  It was a pleasure to see you today. I want to create trusting relationships with patients. If you receive a survey regarding your visit,  I greatly appreciate you taking time to fill this out on paper or through your MyChart. I value your feedback.  Venetia Night, MSN, FNP-BC, AGACNP-BC [facility]

## 2023-01-06 ENCOUNTER — Other Ambulatory Visit (INDEPENDENT_AMBULATORY_CARE_PROVIDER_SITE_OTHER): Payer: Self-pay | Admitting: *Deleted

## 2023-01-06 ENCOUNTER — Telehealth (INDEPENDENT_AMBULATORY_CARE_PROVIDER_SITE_OTHER): Payer: Self-pay | Admitting: *Deleted

## 2023-01-06 ENCOUNTER — Encounter (INDEPENDENT_AMBULATORY_CARE_PROVIDER_SITE_OTHER): Payer: Self-pay | Admitting: *Deleted

## 2023-01-06 DIAGNOSIS — Z860100 Personal history of colon polyps, unspecified: Secondary | ICD-10-CM

## 2023-01-06 DIAGNOSIS — Z800 Family history of malignant neoplasm of digestive organs: Secondary | ICD-10-CM

## 2023-01-06 MED ORDER — PEG 3350-KCL-NA BICARB-NACL 420 G PO SOLR: 4000.0000 mL | Freq: Once | ORAL | 0 refills | Status: AC

## 2023-01-06 NOTE — Telephone Encounter (Signed)
PA approved via cohere Authorization [id number], DOS: 01/21/2023 - 03/23/2023

## 2023-01-08 ENCOUNTER — Other Ambulatory Visit (HOSPITAL_COMMUNITY)
Admission: RE | Admit: 2023-01-08 | Discharge: 2023-01-08 | Disposition: A | Discharge status: Home / Self Care | Payer: Medicare HMO | Source: Ambulatory Visit | Attending: Internal Medicine | Admitting: Internal Medicine

## 2023-01-08 DIAGNOSIS — N390 Urinary tract infection, site not specified: Secondary | ICD-10-CM | POA: Diagnosis not present

## 2023-01-08 DIAGNOSIS — Z86010 Personal history of colonic polyps: Secondary | ICD-10-CM | POA: Diagnosis not present

## 2023-01-08 LAB — BASIC METABOLIC PANEL
Anion gap: 10 (ref 5–15)
BUN: 14 mg/dL (ref 8–23)
CO2: 22 mmol/L (ref 22–32)
Calcium: 9.8 mg/dL (ref 8.9–10.3)
Chloride: 101 mmol/L (ref 98–111)
Creatinine, Ser: 1.01 mg/dL — ABNORMAL HIGH (ref 0.44–1.00)
GFR, Estimated: 60 mL/min — ABNORMAL LOW (ref >60)
Glucose, Bld: 172 mg/dL — ABNORMAL HIGH (ref 70–99)
Potassium: 3.3 mmol/L — ABNORMAL LOW (ref 3.5–5.1)
Sodium: 133 mmol/L — ABNORMAL LOW (ref 135–145)

## 2023-01-21 ENCOUNTER — Ambulatory Visit (HOSPITAL_COMMUNITY)
Admission: RE | Admit: 2023-01-21 | Discharge: 2023-01-21 | Disposition: A | Discharge status: Home / Self Care | Payer: Medicare HMO | Source: Ambulatory Visit | Attending: Internal Medicine | Admitting: Internal Medicine

## 2023-01-21 ENCOUNTER — Ambulatory Visit (HOSPITAL_COMMUNITY): Payer: Medicare HMO | Admitting: Anesthesiology

## 2023-01-21 ENCOUNTER — Ambulatory Visit (HOSPITAL_BASED_OUTPATIENT_CLINIC_OR_DEPARTMENT_OTHER): Payer: Medicare HMO | Admitting: Anesthesiology

## 2023-01-21 ENCOUNTER — Encounter (HOSPITAL_COMMUNITY)
Admission: RE | Disposition: A | Discharge status: Home / Self Care | Payer: Self-pay | Source: Ambulatory Visit | Attending: Internal Medicine

## 2023-01-21 ENCOUNTER — Other Ambulatory Visit: Payer: Self-pay

## 2023-01-21 ENCOUNTER — Encounter (HOSPITAL_COMMUNITY): Payer: Self-pay | Admitting: Internal Medicine

## 2023-01-21 DIAGNOSIS — Z1211 Encounter for screening for malignant neoplasm of colon: Secondary | ICD-10-CM | POA: Diagnosis not present

## 2023-01-21 DIAGNOSIS — K642 Third degree hemorrhoids: Secondary | ICD-10-CM | POA: Insufficient documentation

## 2023-01-21 DIAGNOSIS — I10 Essential (primary) hypertension: Secondary | ICD-10-CM | POA: Insufficient documentation

## 2023-01-21 DIAGNOSIS — Z800 Family history of malignant neoplasm of digestive organs: Secondary | ICD-10-CM

## 2023-01-21 DIAGNOSIS — Z83719 Family history of colon polyps, unspecified: Secondary | ICD-10-CM | POA: Insufficient documentation

## 2023-01-21 DIAGNOSIS — K635 Polyp of colon: Secondary | ICD-10-CM | POA: Diagnosis not present

## 2023-01-21 DIAGNOSIS — E119 Type 2 diabetes mellitus without complications: Secondary | ICD-10-CM | POA: Insufficient documentation

## 2023-01-21 DIAGNOSIS — Z8719 Personal history of other diseases of the digestive system: Secondary | ICD-10-CM | POA: Diagnosis not present

## 2023-01-21 DIAGNOSIS — Z7984 Long term (current) use of oral hypoglycemic drugs: Secondary | ICD-10-CM | POA: Diagnosis not present

## 2023-01-21 DIAGNOSIS — K5900 Constipation, unspecified: Secondary | ICD-10-CM | POA: Diagnosis not present

## 2023-01-21 DIAGNOSIS — K649 Unspecified hemorrhoids: Secondary | ICD-10-CM

## 2023-01-21 DIAGNOSIS — K5790 Diverticulosis of intestine, part unspecified, without perforation or abscess without bleeding: Secondary | ICD-10-CM | POA: Diagnosis not present

## 2023-01-21 DIAGNOSIS — Z86010 Personal history of colonic polyps: Secondary | ICD-10-CM

## 2023-01-21 HISTORY — PX: COLONOSCOPY WITH PROPOFOL: SHX5780

## 2023-01-21 LAB — GLUCOSE, CAPILLARY: Glucose-Capillary: 129 mg/dL — ABNORMAL HIGH (ref 70–99)

## 2023-01-21 SURGERY — COLONOSCOPY WITH PROPOFOL
Anesthesia: General

## 2023-01-21 MED ORDER — LACTATED RINGERS IV SOLN: INTRAVENOUS | Status: DC

## 2023-01-21 MED ORDER — PROPOFOL 500 MG/50ML IV EMUL
INTRAVENOUS | Status: DC | PRN
  Administered 2023-01-21: 100 ug/kg/min via INTRAVENOUS

## 2023-01-21 MED ORDER — PROPOFOL 10 MG/ML IV BOLUS
INTRAVENOUS | Status: DC | PRN
  Administered 2023-01-21: 180 mg via INTRAVENOUS

## 2023-01-21 NOTE — Op Note (Signed)
[facility] Patient Name: Katelyn [name] Procedure Date: 01/21/2023 9:20 AM MRN: [id number] Date of Birth: 1951/02/27 Attending MD: Norvel Richards , MD, [id number] CSN: [id number] Age: 72 Admit Type: Outpatient Procedure:                Colonoscopy Indications:              High risk colon cancer surveillance: Personal                            history of colonic polyps; 2 first-degree relatives                            with colon cancer Providers:                Norvel Richards, MD, Rosina Lowenstein, RN, Raphael Gibney, Technician Referring MD:              Medicines:                Propofol per Anesthesia Complications:            No immediate complications. Estimated Blood Loss:     Estimated blood loss: none. Procedure:                Pre-Anesthesia Assessment:                           - Prior to the procedure, a History and Physical                            was performed, and patient medications and                            allergies were reviewed. The patient's tolerance of                            previous anesthesia was also reviewed. The risks                            and benefits of the procedure and the sedation                            options and risks were discussed with the patient.                            All questions were answered, and informed consent                            was obtained. Prior Anticoagulants: The patient has                            taken no anticoagulant or antiplatelet agents. ASA  Grade Assessment: II - A patient with mild systemic                            disease. After reviewing the risks and benefits,                            the patient was deemed in satisfactory condition to                            undergo the procedure.                           After obtaining informed consent, the colonoscope                            was passed under direct  vision. Throughout the                            procedure, the patient's blood pressure, pulse, and                            oxygen saturations were monitored continuously. The                            [id number]) scope was introduced through                            the anus and advanced to the the cecum, identified                            by appendiceal orifice and ileocecal valve. The                            colonoscopy was performed without difficulty. The                            patient tolerated the procedure well. The ileocecal                            valve, appendiceal orifice, and rectum were                            photographed. Scope In: 9:33:11 AM Scope Out: 9:44:04 AM Scope Withdrawal Time: 0 hours 6 minutes 52 seconds  Total Procedure Duration: 0 hours 10 minutes 53 seconds  Findings:      Hemorrhoids were found on perianal exam.      Non-bleeding internal hemorrhoids were found during retroflexion. The       hemorrhoids were moderate, medium-sized and Grade III (internal       hemorrhoids that prolapse but require manual reduction).      The colon (entire examined portion) appeared normal.      The exam was otherwise without abnormality. Impression:               - Hemorrhoids found on perianal exam.                           -  Non-bleeding internal hemorrhoids.                           - The entire examined colon is normal.                           - The examination was otherwise normal.                           - No specimens collected. Moderate Sedation:      Moderate (conscious) sedation was personally administered by an       anesthesia professional. The following parameters were monitored: oxygen       saturation, heart rate, blood pressure, respiratory rate, EKG, adequacy       of pulmonary ventilation, and response to care.      Moderate (conscious) sedation was personally administered by an       anesthesia professional. The  following parameters were monitored: oxygen       saturation, heart rate, blood pressure, respiratory rate, EKG, adequacy       of pulmonary ventilation, and response to care. Recommendation:           - Patient has a contact number available for                            emergencies. The signs and symptoms of potential                            delayed complications were discussed with the                            patient. Return to normal activities tomorrow.                            Written discharge instructions were provided to the                            patient.                           - Advance diet as tolerated.                           - Continue present medications.                           - Repeat colonoscopy in 5 years for surveillance.                           - Return to GI office (date not yet determined). Procedure Code(s):        --- Professional ---                           [phone], Colonoscopy, flexible; diagnostic, including                            collection of specimen(s) by brushing or washing,  when performed (separate procedure) Diagnosis Code(s):        --- Professional ---                           Z86.010, Personal history of colonic polyps                           K64.2, Third degree hemorrhoids CPT copyright 2022 [employer]. All rights reserved. The codes documented in this report are preliminary and upon coder review may  be revised to meet current compliance requirements. Cristopher Estimable. Troye Hiemstra, MD Norvel Richards, MD 01/21/2023 9:52:00 AM This report has been signed electronically. Number of Addenda: 0

## 2023-01-21 NOTE — Discharge Instructions (Signed)
  Colonoscopy Discharge Instructions  Read the instructions outlined below and refer to this sheet in the next few weeks. These discharge instructions provide you with general information on caring for yourself after you leave the hospital. Your doctor may also give you specific instructions. While your treatment has been planned according to the most current medical practices available, unavoidable complications occasionally occur. If you have any problems or questions after discharge, call Dr. Gala Romney at [phone]. ACTIVITY You may resume your regular activity, but move at a slower pace for the next 24 hours.  Take frequent rest periods for the next 24 hours.  Walking will help get rid of the air and reduce the bloated feeling in your belly (abdomen).  No driving for 24 hours (because of the medicine (anesthesia) used during the test).   Do not sign any important legal documents or operate any machinery for 24 hours (because of the anesthesia used during the test).  NUTRITION Drink plenty of fluids.  You may resume your normal diet as instructed by your doctor.  Begin with a light meal and progress to your normal diet. Heavy or fried foods are harder to digest and may make you feel sick to your stomach (nauseated).  Avoid alcoholic beverages for 24 hours or as instructed.  MEDICATIONS You may resume your normal medications unless your doctor tells you otherwise.  WHAT YOU CAN EXPECT TODAY Some feelings of bloating in the abdomen.  Passage of more gas than usual.  Spotting of blood in your stool or on the toilet paper.  IF YOU HAD POLYPS REMOVED DURING THE COLONOSCOPY: No aspirin products for 7 days or as instructed.  No alcohol for 7 days or as instructed.  Eat a soft diet for the next 24 hours.  FINDING OUT THE RESULTS OF YOUR TEST Not all test results are available during your visit. If your test results are not back during the visit, make an appointment with your caregiver to find out the  results. Do not assume everything is normal if you have not heard from your caregiver or the medical facility. It is important for you to follow up on all of your test results.  SEEK IMMEDIATE MEDICAL ATTENTION IF: You have more than a spotting of blood in your stool.  Your belly is swollen (abdominal distention).  You are nauseated or vomiting.  You have a temperature over 101.  You have abdominal pain or discomfort that is severe or gets worse throughout the day.     No polyps found today.  You do have hemorrhoids.  It is recommended you return for repeat colonoscopy in 5 years  At patient request, I called Katelyn [name] [phone] -reviewed findings and recommendations

## 2023-01-21 NOTE — Transfer of Care (Signed)
Immediate Anesthesia Transfer of Care Note  Patient: Katelyn [name]  Procedure(s) Performed: COLONOSCOPY WITH PROPOFOL  Patient Location: Endoscopy Unit  Anesthesia Type:General  Level of Consciousness: awake and patient cooperative  Airway & Oxygen Therapy: Patient Spontanous Breathing  Post-op Assessment: Report given to RN and Post -op Vital signs reviewed and stable  Post vital signs: Reviewed and stable  Last Vitals:  Vitals Value Taken Time  BP 112/85 01/21/23 0946  Temp 36.8 C 01/21/23 0946  Pulse 83 01/21/23 0946  Resp 24 01/21/23 0946  SpO2 98 % 01/21/23 0946    Last Pain:  Vitals:   01/21/23 0946  TempSrc: Oral  PainSc: 0-No pain      Patients Stated Pain Goal: 7 ([id number])  Complications: No notable events documented.

## 2023-01-21 NOTE — Anesthesia Preprocedure Evaluation (Signed)
Anesthesia Evaluation  Patient identified by MRN, date of birth, ID band Patient awake    Reviewed: Allergy & Precautions, H&P , NPO status , Patient's Chart, lab work & pertinent test results, reviewed documented beta blocker date and time   Airway Mallampati: II  TM Distance: >3 FB Neck ROM: full    Dental no notable dental hx.    Pulmonary neg pulmonary ROS   Pulmonary exam normal breath sounds clear to auscultation       Cardiovascular Exercise Tolerance: Good hypertension, negative cardio ROS  Rhythm:regular Rate:Normal     Neuro/Psych negative neurological ROS  negative psych ROS   GI/Hepatic negative GI ROS, Neg liver ROS,,,  Endo/Other  negative endocrine ROSdiabetes, Type 2    Renal/GU negative Renal ROS  negative genitourinary   Musculoskeletal   Abdominal   Peds  Hematology negative hematology ROS (+)   Anesthesia Other Findings   Reproductive/Obstetrics negative OB ROS                             Anesthesia Physical Anesthesia Plan  ASA: 2  Anesthesia Plan: General   Post-op Pain Management:    Induction:   PONV Risk Score and Plan: Propofol infusion  Airway Management Planned:   Additional Equipment:   Intra-op Plan:   Post-operative Plan:   Informed Consent: I have reviewed the patients History and Physical, chart, labs and discussed the procedure including the risks, benefits and alternatives for the proposed anesthesia with the patient or authorized representative who has indicated his/her understanding and acceptance.     Dental Advisory Given  Plan Discussed with: CRNA  Anesthesia Plan Comments:        Anesthesia Quick Evaluation  

## 2023-01-21 NOTE — Interval H&P Note (Signed)
History and Physical Interval Note:  01/21/2023 9:19 AM  Katelyn [name]  has presented today for surgery, with the diagnosis of hx colon polyps, Fhx colon polyps.  The various methods of treatment have been discussed with the patient and family. After consideration of risks, benefits and other options for treatment, the patient has consented to  Procedure(s) with comments: COLONOSCOPY WITH PROPOFOL (N/A) - 945am, asa 2 as a surgical intervention.  The patient's history has been reviewed, patient examined, no change in status, stable for surgery.  I have reviewed the patient's chart and labs.  Questions were answered to the patient's satisfaction.     Katelyn [name]  No change.  Surveillance colonoscopy per plan. The risks, benefits, limitations, alternatives and imponderables have been reviewed with the patient. Questions have been answered. All parties are agreeable.

## 2023-01-21 NOTE — Anesthesia Procedure Notes (Signed)
Date/Time: 01/21/2023 9:27 AM  Performed by: Vista Deck, CRNAPre-anesthesia Checklist: Patient identified, Emergency Drugs available, Suction available, Timeout performed and Patient being monitored Patient Re-evaluated:Patient Re-evaluated prior to induction Oxygen Delivery Method: Nasal Cannula

## 2023-01-22 NOTE — Anesthesia Postprocedure Evaluation (Signed)
Anesthesia Post Note  Patient: Katelyn [name]  Procedure(s) Performed: COLONOSCOPY WITH PROPOFOL  Patient location during evaluation: Phase II Anesthesia Type: General Level of consciousness: awake Pain management: pain level controlled Vital Signs Assessment: post-procedure vital signs reviewed and stable Respiratory status: spontaneous breathing and respiratory function stable Cardiovascular status: blood pressure returned to baseline and stable Postop Assessment: no headache and no apparent nausea or vomiting Anesthetic complications: no Comments: Late entry   No notable events documented.   Last Vitals:  Vitals:   01/21/23 0816 01/21/23 0946  BP: (!) 179/97 112/85  Pulse: 80 83  Resp: 20 (!) 24  Temp: 36.9 C 36.8 C  SpO2: 98% 98%    Last Pain:  Vitals:   01/21/23 0946  TempSrc: Oral  PainSc: 0-No pain                 Windell Norfolk

## 2023-01-28 ENCOUNTER — Encounter (HOSPITAL_COMMUNITY): Payer: Self-pay | Admitting: Internal Medicine

## 2023-01-28 DIAGNOSIS — N390 Urinary tract infection, site not specified: Secondary | ICD-10-CM | POA: Diagnosis not present

## 2023-01-28 DIAGNOSIS — Z6829 Body mass index (BMI) 29.0-29.9, adult: Secondary | ICD-10-CM | POA: Diagnosis not present

## 2023-01-28 DIAGNOSIS — E663 Overweight: Secondary | ICD-10-CM | POA: Diagnosis not present

## 2023-01-28 DIAGNOSIS — E1122 Type 2 diabetes mellitus with diabetic chronic kidney disease: Secondary | ICD-10-CM | POA: Diagnosis not present

## 2023-01-28 DIAGNOSIS — I10 Essential (primary) hypertension: Secondary | ICD-10-CM | POA: Diagnosis not present

## 2023-02-08 DIAGNOSIS — E119 Type 2 diabetes mellitus without complications: Secondary | ICD-10-CM | POA: Diagnosis not present

## 2023-02-08 DIAGNOSIS — H43813 Vitreous degeneration, bilateral: Secondary | ICD-10-CM | POA: Diagnosis not present

## 2023-02-08 DIAGNOSIS — H25812 Combined forms of age-related cataract, left eye: Secondary | ICD-10-CM | POA: Diagnosis not present

## 2023-02-22 DIAGNOSIS — N390 Urinary tract infection, site not specified: Secondary | ICD-10-CM | POA: Diagnosis not present

## 2023-02-22 DIAGNOSIS — E663 Overweight: Secondary | ICD-10-CM | POA: Diagnosis not present

## 2023-02-22 DIAGNOSIS — Z6829 Body mass index (BMI) 29.0-29.9, adult: Secondary | ICD-10-CM | POA: Diagnosis not present

## 2023-03-03 DIAGNOSIS — Z0100 Encounter for examination of eyes and vision without abnormal findings: Secondary | ICD-10-CM | POA: Diagnosis not present

## 2023-03-25 ENCOUNTER — Ambulatory Visit: Payer: Medicare HMO | Admitting: Urology

## 2023-03-25 ENCOUNTER — Encounter: Payer: Self-pay | Admitting: Urology

## 2023-03-25 VITALS — BP 170/91 | HR 79

## 2023-03-25 DIAGNOSIS — N29 Other disorders of kidney and ureter in diseases classified elsewhere: Secondary | ICD-10-CM

## 2023-03-25 DIAGNOSIS — Z87440 Personal history of urinary (tract) infections: Secondary | ICD-10-CM

## 2023-03-25 DIAGNOSIS — N390 Urinary tract infection, site not specified: Secondary | ICD-10-CM | POA: Diagnosis not present

## 2023-03-25 DIAGNOSIS — N814 Uterovaginal prolapse, unspecified: Secondary | ICD-10-CM | POA: Diagnosis not present

## 2023-03-25 DIAGNOSIS — R3914 Feeling of incomplete bladder emptying: Secondary | ICD-10-CM

## 2023-03-25 DIAGNOSIS — R339 Retention of urine, unspecified: Secondary | ICD-10-CM | POA: Diagnosis not present

## 2023-03-25 LAB — URINALYSIS, ROUTINE W REFLEX MICROSCOPIC
Bilirubin, UA: NEGATIVE
Glucose, UA: NEGATIVE
Ketones, UA: NEGATIVE
Nitrite, UA: NEGATIVE
Protein,UA: NEGATIVE
Specific Gravity, UA: <1.005 — ABNORMAL LOW (ref 1.005–1.030)
Urobilinogen, Ur: 0.2 mg/dL (ref 0.2–1.0)
pH, UA: 6 (ref 5.0–7.5)

## 2023-03-25 LAB — MICROSCOPIC EXAMINATION: WBC, UA: >30 /hpf — AB (ref 0–5)

## 2023-03-25 NOTE — Progress Notes (Signed)
Subjective: 1. Recurrent UTI   2. Nephrocalcinosis   3. Incomplete bladder emptying   4. Uterine prolapse      Consult requested by Terie Purser PA.  Katelyn [name] is a 72 yo female who is sent for recurrent UTI's.  She had e. Coli sepsis in 6/23 and was found to have CT findings consistent with mild nephrocalcinosis and she had mild right hydro but didn't have urologist consultation.  She had been on macrobid for suppression and had a therapeutic course of doxycycline in late April.  I am unable to see the culture results and sensitivities in the provided records.   Her Cr was 1/01 on 01/08/23. She took the last antibiotic a week ago.  She has no dysuria or frequency.  She has stable nocturia.  She has no hematuria.  She had right flank pain last week but not currently.  She has had no GU surgery.  She no pneumoturia or fecaluria.  She has some urgency but no incontinence.  She has malodorous urine.  Her PVR is 15ml.  She has some prolapse.   She has not had a hysterectomy.  She is G4P4 with vaginal deliveries.  She has hyperparathyroidism and is being treated with Vit D.   ROS:  Review of Systems  All other systems reviewed and are negative.   No Known Allergies  Past Medical History:  Diagnosis Date   Acute meniscal tear of knee    RIGHT   Arthritis    BACK   Diabetes mellitus, type 2 (HCC)    Hypercholesteremia    Hypertension    Parathyroid disorder [facility])     Past Surgical History:  Procedure Laterality Date   CATARACT EXTRACTION Right    COLONOSCOPY     COLONOSCOPY N/A 12/01/2017   Procedure: COLONOSCOPY;  Surgeon: Corbin Ade, MD;  [facility];  Service: Endoscopy;  Laterality: N/A;  10:45   COLONOSCOPY WITH PROPOFOL N/A 01/21/2023   Procedure: COLONOSCOPY WITH PROPOFOL;  Surgeon: Corbin Ade, MD;  Location: [facility];  Service: Endoscopy;  Laterality: N/A;  945am, asa 2   KNEE ARTHROSCOPY WITH LATERAL MENISECTOMY Right 02/16/2013   Procedure: RIGHT  KNEE  ARTHROSCOPY WITH PARTIAL LATERAL MENISECTOMY; Excision of plica with shaving of patella,  partial medial menisectomy;  Surgeon: Drucilla Schmidt, MD;  Location: [facility];  Service: Orthopedics;  Laterality: Right;   POLYPECTOMY  12/01/2017   Procedure: POLYPECTOMY;  Surgeon: Corbin Ade, MD;  Location: [facility];  Service: Endoscopy;;    Social History   Socioeconomic History   Marital status: Divorced    Spouse name: Not on file   Number of children: Not on file   Years of education: Not on file   Highest education level: Not on file  Occupational History   Not on file  Tobacco Use   Smoking status: Never    Passive exposure: Past   Smokeless tobacco: Never  Vaping Use   Vaping Use: Never used  Substance and Sexual Activity   Alcohol use: No   Drug use: No   Sexual activity: Not on file  Other Topics Concern   Not on file  Social History Narrative   Not on file   Social Determinants of Health   Financial Resource Strain: Not on file  Food Insecurity: Not on file  Transportation Needs: Not on file  Physical Activity: Not on file  Stress: Not on file  Social Connections: Not on file  Intimate Partner Violence: Not on file    Family History  Problem Relation Age of Onset   Stroke Mother    Colon cancer Father    Diabetes Father    Breast cancer Daughter 54   Colon cancer Brother    Hyperparathyroidism Neg Hx     Anti-infectives: Anti-infectives (From admission, onward)    None       Current Outpatient Medications  Medication Sig Dispense Refill   ACCU-CHEK AVIVA PLUS test strip 1 each daily.     amLODipine-benazepril (LOTREL) 10-40 MG capsule Take 1 capsule by mouth daily. 90 capsule 3   aspirin EC 81 MG tablet Take 1 tablet (81 mg total) by mouth daily with breakfast. 100 tablet 3   cholecalciferol (VITAMIN D) 1000 units tablet Take 1,000 Units by mouth daily.     furosemide (LASIX) 20 MG tablet Take 1 tablet (20 mg  total) by mouth daily. 90 tablet 2   glipiZIDE (GLUCOTROL XL) 10 MG 24 hr tablet Take 1 tablet (10 mg total) by mouth daily with breakfast. 90 tablet 3   metFORMIN (GLUCOPHAGE) 1000 MG tablet Take 1 tablet (1,000 mg total) by mouth 2 (two) times daily. 60 tablet 5   metoprolol succinate (TOPROL-XL) 50 MG 24 hr tablet Take 1 tablet (50 mg total) by mouth daily. 90 tablet 3   OMEGA 3 1000 MG CAPS Take 1,000 mg by mouth daily.     rosuvastatin (CRESTOR) 20 MG tablet Take 1 tablet (20 mg total) by mouth daily. 90 tablet 3   No current facility-administered medications for this visit.     Objective: Vital signs in last 24 hours: BP (!) 170/91   Pulse 79   Intake/Output from previous day: No intake/output data recorded. Intake/Output this shift: @IOTHISSHIFT @   Physical Exam Vitals reviewed.  Constitutional:      Appearance: Normal appearance.  Cardiovascular:     Rate and Rhythm: Normal rate and regular rhythm.     Heart sounds: Normal heart sounds.  Pulmonary:     Effort: Pulmonary effort is normal. No respiratory distress.     Breath sounds: Normal breath sounds.  Abdominal:     Palpations: Abdomen is soft. There is no mass.     Tenderness: There is no abdominal tenderness.     Hernia: No hernia is present.  Genitourinary:    Comments: Nl external genitalia. Urethral meatus is normal without urethral mass or hypermobility. Bladder is non-tender. No prolapse noted while in lithotomy. Cervix and uterus normal. No adnexal masses. She has uterine prolapse 2-3cm beyond the introitus with standing.    Musculoskeletal:        General: No swelling or tenderness. Normal range of motion.  Skin:    General: Skin is warm and dry.  Neurological:     General: No focal deficit present.     Mental Status: She is alert and oriented to person, place, and time.  Psychiatric:        Mood and Affect: Mood normal.        Behavior: Behavior normal.     Lab Results:  Results for orders  placed or performed in visit on 03/25/23 (from the past 24 hour(s))  Urinalysis, Routine w reflex microscopic     Status: Abnormal   Collection Time: 03/25/23  9:52 AM  Result Value Ref Range   Specific Gravity, UA <1.005 (L) 1.005 - 1.030   pH, UA 6.0 5.0 - 7.5   Color, UA Yellow Yellow  Appearance Ur Clear Clear   Leukocytes,UA 3+ (A) Negative   Protein,UA Negative Negative/Trace   Glucose, UA Negative Negative   Ketones, UA Negative Negative   RBC, UA Trace (A) Negative   Bilirubin, UA Negative Negative   Urobilinogen, Ur 0.2 0.2 - 1.0 mg/dL   Nitrite, UA Negative Negative   Microscopic Examination See below:    Narrative   Performed at:  [address] - [facility] [address] [place], [place]  [id number] Lab Director: Chinita Pester MT, Phone:  [phone]  Microscopic Examination     Status: Abnormal   Collection Time: 03/25/23  9:52 AM   Urine  Result Value Ref Range   WBC, UA >30 (A) 0 - 5 /hpf   RBC, Urine 0-2 0 - 2 /hpf   Epithelial Cells (non renal) 0-10 0 - 10 /hpf   Bacteria, UA Moderate (A) None seen/Few   Narrative   Performed at:  [address] - [facility] [address], [place], [place]  [id number] Lab Director: Chinita Pester MT, Phone:  [phone]   Cath UA has 11-30 WBC and few bacteria.  BMET No results for input(s): "NA", "K", "CL", "CO2", "GLUCOSE", "BUN", "CREATININE", "CALCIUM" in the last 72 hours. PT/INR No results for input(s): "LABPROT", "INR" in the last 72 hours. ABG No results for input(s): "PHART", "HCO3" in the last 72 hours.  Invalid input(s): "PCO2", "PO2" I have reviewed her labs from [facility] and the hospital.  Studies/Results: No results found. Cath PVR is I have reviewed her hospital notes and referral notes as well as the CT films from last year.  Assessment/Plan: Recurrent UTI's.  I will culture the cath UA today.   Her UA's in the past may have been contaminated specimens from the prolapse.  Uterine  prolapse.  I discussed management options including a pessary and surgery.  I will get her set up to discuss both of those options.  Incomplete bladder emptying.   Her PVR was  Nephrocalcinosis.  I will get are repeat CT stone study to reevaluate her stone burden.   No orders of the defined types were placed in this encounter.    Orders Placed This Encounter  Procedures   Urine Culture   Microscopic Examination   CT RENAL STONE STUDY    Standing Status:   Future    Standing Expiration Date:   06/25/2023    Order Specific Question:   Preferred imaging location?    Answer:   [facility]    Order Specific Question:   Radiology Contrast Protocol - do NOT remove file path    Answer:   [email or web address]   Urinalysis, Routine w reflex microscopic   Ambulatory referral to Urology    Referral Priority:   Routine    Referral Type:   Consultation    Referral Reason:   Specialty Services Required    Referred to Provider:   Crist Fat, MD    Requested Specialty:   Urology    Number of Visits Requested:   1   In and Out Cath   BLADDER SCAN AMB NON-IMAGING     Return for Needs next available f/u with [facility] Dec to discuss a pessary. , Next available.    CC:  Terie Purser PA.      Bjorn Pippin 03/26/2023

## 2023-03-25 NOTE — Progress Notes (Signed)
post void residual=15 

## 2023-03-25 NOTE — Progress Notes (Signed)
Pt is prepped for and in and out catherization. Patient was cleaned and prepped in a sterle fashion with betadine. A 14 fr catheter foley was inserted. Urine return was note 180 ml.  Performed by Guss Bunde, CMA  Urine sent ua and culture per MD

## 2023-03-26 DIAGNOSIS — E210 Primary hyperparathyroidism: Secondary | ICD-10-CM | POA: Diagnosis not present

## 2023-03-26 DIAGNOSIS — E782 Mixed hyperlipidemia: Secondary | ICD-10-CM | POA: Diagnosis not present

## 2023-03-26 DIAGNOSIS — Z6829 Body mass index (BMI) 29.0-29.9, adult: Secondary | ICD-10-CM | POA: Diagnosis not present

## 2023-03-26 DIAGNOSIS — E1122 Type 2 diabetes mellitus with diabetic chronic kidney disease: Secondary | ICD-10-CM | POA: Diagnosis not present

## 2023-03-26 DIAGNOSIS — I10 Essential (primary) hypertension: Secondary | ICD-10-CM | POA: Diagnosis not present

## 2023-03-26 DIAGNOSIS — N181 Chronic kidney disease, stage 1: Secondary | ICD-10-CM | POA: Diagnosis not present

## 2023-03-26 DIAGNOSIS — E663 Overweight: Secondary | ICD-10-CM | POA: Diagnosis not present

## 2023-03-31 LAB — URINE CULTURE

## 2023-04-02 ENCOUNTER — Telehealth: Payer: Self-pay

## 2023-04-02 MED ORDER — CEPHALEXIN 500 MG PO CAPS: 500.0000 mg | ORAL_CAPSULE | Freq: Three times a day (TID) | ORAL | 0 refills | Status: DC

## 2023-04-02 NOTE — Telephone Encounter (Signed)
Left detailed message informing patient of positive urine culture and abx sent to [facility] pharmacy.  Office hours provided along with a call back number.

## 2023-04-02 NOTE — Telephone Encounter (Signed)
-----   Message from Bjorn Pippin, MD sent at 03/31/2023  4:50 PM EDT ----- Keflex 500mg  po tid #21.  ----- Message ----- From: Grier Rocher, CMA Sent: 03/31/2023   4:03 PM EDT To: Bjorn Pippin, MD  Please review, no treatment started

## 2023-04-15 ENCOUNTER — Ambulatory Visit: Payer: Medicare HMO | Admitting: Urology

## 2023-04-20 ENCOUNTER — Ambulatory Visit (HOSPITAL_COMMUNITY)
Admission: RE | Admit: 2023-04-20 | Discharge: 2023-04-20 | Disposition: A | Discharge status: Home / Self Care | Payer: Medicare HMO | Source: Ambulatory Visit | Attending: Urology | Admitting: Urology

## 2023-04-20 DIAGNOSIS — N29 Other disorders of kidney and ureter in diseases classified elsewhere: Secondary | ICD-10-CM | POA: Diagnosis not present

## 2023-04-20 DIAGNOSIS — Q630 Accessory kidney: Secondary | ICD-10-CM | POA: Diagnosis not present

## 2023-04-20 DIAGNOSIS — K429 Umbilical hernia without obstruction or gangrene: Secondary | ICD-10-CM | POA: Diagnosis not present

## 2023-04-20 DIAGNOSIS — K449 Diaphragmatic hernia without obstruction or gangrene: Secondary | ICD-10-CM | POA: Diagnosis not present

## 2023-04-20 DIAGNOSIS — N200 Calculus of kidney: Secondary | ICD-10-CM | POA: Diagnosis not present

## 2023-04-21 ENCOUNTER — Other Ambulatory Visit: Payer: Self-pay | Admitting: Family Medicine

## 2023-04-21 DIAGNOSIS — Z1231 Encounter for screening mammogram for malignant neoplasm of breast: Secondary | ICD-10-CM

## 2023-04-29 DIAGNOSIS — E6609 Other obesity due to excess calories: Secondary | ICD-10-CM | POA: Diagnosis not present

## 2023-04-29 DIAGNOSIS — Z6829 Body mass index (BMI) 29.0-29.9, adult: Secondary | ICD-10-CM | POA: Diagnosis not present

## 2023-04-29 DIAGNOSIS — I10 Essential (primary) hypertension: Secondary | ICD-10-CM | POA: Diagnosis not present

## 2023-04-29 DIAGNOSIS — N390 Urinary tract infection, site not specified: Secondary | ICD-10-CM | POA: Diagnosis not present

## 2023-05-10 ENCOUNTER — Encounter: Payer: Self-pay | Admitting: Gastroenterology

## 2023-05-10 ENCOUNTER — Ambulatory Visit: Payer: Medicare HMO | Admitting: Gastroenterology

## 2023-05-10 VITALS — BP 139/86 | HR 58 | Temp 97.8°F | Ht 67.0 in | Wt 187.0 lb

## 2023-05-10 DIAGNOSIS — Z860100 Personal history of colon polyps, unspecified: Secondary | ICD-10-CM

## 2023-05-10 DIAGNOSIS — K5900 Constipation, unspecified: Secondary | ICD-10-CM | POA: Diagnosis not present

## 2023-05-10 DIAGNOSIS — Z800 Family history of malignant neoplasm of digestive organs: Secondary | ICD-10-CM

## 2023-05-10 NOTE — Progress Notes (Signed)
GI Office Note    Referring Provider: Avis Epley, PA* Primary Care Physician:  Ladon Applebaum Primary Gastroenterologist: Gerrit Friends.Rourk, MD  Date:  05/10/2023  ID:  Renold Genta, DOB 1951-03-29, MRN [id number]   Chief Complaint   Chief Complaint  Patient presents with   Follow-up    Follow up. No problems    History of Present Illness  Katelyn [name] is a 72 y.o. female with a history of diabetes, HLD, HTN, parathyroid disorders, arthritis, and constipation presenting today for follow up.    Colonoscopy February 2019: -4 mm polyp in the hepatic flexure s/p resection and ablation -Nonbleeding grade 2 internal hemorrhoids -Advise repeat colonoscopy in 5 years  Last office visit 01/05/23. patient reports intermittent constipation for many years.  Takes Dulcolax only if no BM in few days.  At times does want a whole week without a bowel movement.  Usually with a bowel movement 1-2 times per week.  Denies any abdominal pain increased gassiness.  Positive straining.  Hemorrhoids not currently bothering her.  She did report a history of colon cancer in her father and brother.  She is scheduled for colonoscopy in the near future with Dr. Jena Gauss.  Advised MiraLAX daily or every other day and daily fiber supplementation.  Colonoscopy 01/21/23: -Hemorrhoids on perianal exam -Nonbleeding internal hemorrhoids -Normal colon -Repeat colonoscopy in 5 years  Today:  Constipation - Has been using miralax daily and is having good results. Going everyday now. No abdominal pain. Denies any melena, brbpr, lack of appetite, weight loss.   Has been having chronic UTIs and remains on antibiotics for this. Will be seeing someone in [place] to help with management of this.   Denies any upper GI symptoms.   Current Outpatient Medications  Medication Sig Dispense Refill   ACCU-CHEK AVIVA PLUS test strip 1 each daily.     amLODipine-benazepril (LOTREL) 10-40 MG capsule Take  1 capsule by mouth daily. 90 capsule 3   aspirin EC 81 MG tablet Take 1 tablet (81 mg total) by mouth daily with breakfast. 100 tablet 3   cephALEXin (KEFLEX) 500 MG capsule Take 1 capsule (500 mg total) by mouth 3 (three) times daily. 21 capsule 0   cholecalciferol (VITAMIN D) 1000 units tablet Take 1,000 Units by mouth daily.     furosemide (LASIX) 20 MG tablet Take 1 tablet (20 mg total) by mouth daily. 90 tablet 2   glipiZIDE (GLUCOTROL XL) 10 MG 24 hr tablet Take 1 tablet (10 mg total) by mouth daily with breakfast. 90 tablet 3   JARDIANCE 25 MG TABS tablet Take 25 mg by mouth daily.     metFORMIN (GLUCOPHAGE) 1000 MG tablet Take 1 tablet (1,000 mg total) by mouth 2 (two) times daily. 60 tablet 5   metoprolol succinate (TOPROL-XL) 50 MG 24 hr tablet Take 1 tablet (50 mg total) by mouth daily. 90 tablet 3   OMEGA 3 1000 MG CAPS Take 1,000 mg by mouth daily.     rosuvastatin (CRESTOR) 20 MG tablet Take 1 tablet (20 mg total) by mouth daily. 90 tablet 3   No current facility-administered medications for this visit.    Past Medical History:  Diagnosis Date   Acute meniscal tear of knee    RIGHT   Arthritis    BACK   Diabetes mellitus, type 2 (HCC)    Hypercholesteremia    Hypertension    Parathyroid disorder [facility])     Past Surgical History:  Procedure Laterality Date   CATARACT EXTRACTION Right    COLONOSCOPY     COLONOSCOPY N/A 12/01/2017   Procedure: COLONOSCOPY;  Surgeon: Corbin Ade, MD;  [facility];  Service: Endoscopy;  Laterality: N/A;  10:45   COLONOSCOPY WITH PROPOFOL N/A 01/21/2023   Procedure: COLONOSCOPY WITH PROPOFOL;  Surgeon: Corbin Ade, MD;  [facility];  Service: Endoscopy;  Laterality: N/A;  945am, asa 2   KNEE ARTHROSCOPY WITH LATERAL MENISECTOMY Right 02/16/2013   Procedure: RIGHT KNEE  ARTHROSCOPY WITH PARTIAL LATERAL MENISECTOMY; Excision of plica with shaving of patella,  partial medial menisectomy;  Surgeon: Drucilla Schmidt, MD;  Location: [facility];  Service: Orthopedics;  Laterality: Right;   POLYPECTOMY  12/01/2017   Procedure: POLYPECTOMY;  Surgeon: Corbin Ade, MD;  [facility];  Service: Endoscopy;;    Family History  Problem Relation Age of Onset   Stroke Mother    Colon cancer Father    Diabetes Father    Breast cancer Daughter 52   Colon cancer Brother    Hyperparathyroidism Neg Hx     Allergies as of 05/10/2023   (No Known Allergies)    Social History   Socioeconomic History   Marital status: Divorced    Spouse name: Not on file   Number of children: Not on file   Years of education: Not on file   Highest education level: Not on file  Occupational History   Not on file  Tobacco Use   Smoking status: Never    Passive exposure: Past   Smokeless tobacco: Never  Vaping Use   Vaping status: Never Used  Substance and Sexual Activity   Alcohol use: No   Drug use: No   Sexual activity: Not on file  Other Topics Concern   Not on file  Social History Narrative   Not on file   Social Determinants of Health   Financial Resource Strain: Not on file  Food Insecurity: Not on file  Transportation Needs: Not on file  Physical Activity: Not on file  Stress: Not on file  Social Connections: Not on file     Review of Systems   Gen: Denies fever, chills, anorexia. Denies fatigue, weakness, weight loss.  CV: Denies chest pain, palpitations, syncope, peripheral edema, and claudication. Resp: Denies dyspnea at rest, cough, wheezing, coughing up blood, and pleurisy. GI: See HPI Derm: Denies rash, itching, dry skin Psych: Denies depression, anxiety, memory loss, confusion. No homicidal or suicidal ideation.  Heme: Denies bruising, bleeding, and enlarged lymph nodes.   Physical Exam   BP 139/86 (BP Location: Right Arm, Patient Position: Sitting, Cuff Size: Normal)   Pulse (!) 58   Temp 97.8 F (36.6 C) (Temporal)   Ht 5\' 7"  (1.702 m)   Wt  187 lb (84.8 kg)   SpO2 97%   BMI 29.29 kg/m   General:   Alert and oriented. No distress noted. Pleasant and cooperative.  Head:  Normocephalic and atraumatic. Eyes:  Conjuctiva clear without scleral icterus. Mouth:  Oral mucosa pink and moist. Good dentition. No lesions. Abdomen:  +BS, soft, non-tender and non-distended. No rebound or guarding. No HSM or masses noted. Rectal: deferred Msk:  Symmetrical without gross deformities. Normal posture. Extremities:  Without edema. Neurologic:  Alert and  oriented x4 Psych:  Alert and cooperative. Normal mood and affect.   Assessment  AARIEL EMS is a 72 y.o. female with a history of diabetes, HLD,  HTN, parathyroid disorder, arthrisits, and constipation presenting today for follow up.   Constipation: Well controlled with daily Miralax.   Family history of colon cancer, history of colon polyps: Family history of colon cancer in brother and father. Colonoscopy in 2019 with polyps. Recent colonoscopy in April without polyps. Due for surveillance in 5 years (2029).   PLAN   Continue daily miralax High fiber diet Follow up in 1 year TCS in 2029    Brooke Bonito, MSN, FNP-BC, AGACNP-BC [facility]

## 2023-05-10 NOTE — Patient Instructions (Addendum)
It was great to see you again today!  Continue daily miralax. You can take this long term as long as you are seeing good results.  Remember to follow a high fiber diet.   You will be due for another colonoscopy in 5 years.   We will follow up in 1 year, sooner if needed.   It was a pleasure to see you today. I want to create trusting relationships with patients. If you receive a survey regarding your visit,  I greatly appreciate you taking time to fill this out on paper or through your MyChart. I value your feedback.  Brooke Bonito, MSN, FNP-BC, AGACNP-BC [facility]

## 2023-05-17 ENCOUNTER — Ambulatory Visit: Admission: RE | Admit: 2023-05-17 | Payer: Medicare HMO | Source: Ambulatory Visit

## 2023-05-17 ENCOUNTER — Inpatient Hospital Stay: Admission: RE | Admit: 2023-05-17 | Payer: Medicare HMO | Source: Ambulatory Visit

## 2023-05-17 DIAGNOSIS — Z1231 Encounter for screening mammogram for malignant neoplasm of breast: Secondary | ICD-10-CM | POA: Diagnosis not present

## 2023-06-08 DIAGNOSIS — N8111 Cystocele, midline: Secondary | ICD-10-CM | POA: Diagnosis not present

## 2023-06-08 DIAGNOSIS — I509 Heart failure, unspecified: Secondary | ICD-10-CM | POA: Diagnosis not present

## 2023-06-08 DIAGNOSIS — N3020 Other chronic cystitis without hematuria: Secondary | ICD-10-CM | POA: Diagnosis not present

## 2023-06-17 DIAGNOSIS — N8111 Cystocele, midline: Secondary | ICD-10-CM | POA: Diagnosis not present

## 2023-07-01 DIAGNOSIS — N181 Chronic kidney disease, stage 1: Secondary | ICD-10-CM | POA: Diagnosis not present

## 2023-07-01 DIAGNOSIS — I10 Essential (primary) hypertension: Secondary | ICD-10-CM | POA: Diagnosis not present

## 2023-07-01 DIAGNOSIS — Z6828 Body mass index (BMI) 28.0-28.9, adult: Secondary | ICD-10-CM | POA: Diagnosis not present

## 2023-07-01 DIAGNOSIS — E210 Primary hyperparathyroidism: Secondary | ICD-10-CM | POA: Diagnosis not present

## 2023-07-01 DIAGNOSIS — E1122 Type 2 diabetes mellitus with diabetic chronic kidney disease: Secondary | ICD-10-CM | POA: Diagnosis not present

## 2023-07-01 DIAGNOSIS — N8111 Cystocele, midline: Secondary | ICD-10-CM | POA: Diagnosis not present

## 2023-07-01 DIAGNOSIS — N8110 Cystocele, unspecified: Secondary | ICD-10-CM | POA: Diagnosis not present

## 2023-07-01 LAB — COMPREHENSIVE METABOLIC PANEL
Calcium: 11.5 — AB (ref 8.7–10.7)
eGFR: 56

## 2023-07-01 LAB — TSH: TSH: 1.55 (ref 0.41–5.90)

## 2023-07-01 LAB — BASIC METABOLIC PANEL
BUN: 15 (ref 4–21)
Creatinine: 1.1 (ref 0.5–1.1)

## 2023-07-21 DIAGNOSIS — N8111 Cystocele, midline: Secondary | ICD-10-CM | POA: Diagnosis not present

## 2023-09-08 DIAGNOSIS — N8111 Cystocele, midline: Secondary | ICD-10-CM | POA: Diagnosis not present

## 2023-09-08 DIAGNOSIS — N3020 Other chronic cystitis without hematuria: Secondary | ICD-10-CM | POA: Diagnosis not present

## 2023-09-30 DIAGNOSIS — E6609 Other obesity due to excess calories: Secondary | ICD-10-CM | POA: Diagnosis not present

## 2023-09-30 DIAGNOSIS — E1122 Type 2 diabetes mellitus with diabetic chronic kidney disease: Secondary | ICD-10-CM | POA: Diagnosis not present

## 2023-09-30 DIAGNOSIS — I10 Essential (primary) hypertension: Secondary | ICD-10-CM | POA: Diagnosis not present

## 2023-09-30 DIAGNOSIS — Z6829 Body mass index (BMI) 29.0-29.9, adult: Secondary | ICD-10-CM | POA: Diagnosis not present

## 2023-09-30 DIAGNOSIS — N390 Urinary tract infection, site not specified: Secondary | ICD-10-CM | POA: Diagnosis not present

## 2023-10-07 ENCOUNTER — Encounter: Payer: Self-pay | Admitting: Nurse Practitioner

## 2023-10-07 ENCOUNTER — Ambulatory Visit: Payer: Medicare HMO | Admitting: Nurse Practitioner

## 2023-10-07 DIAGNOSIS — E213 Hyperparathyroidism, unspecified: Secondary | ICD-10-CM | POA: Diagnosis not present

## 2023-10-07 NOTE — Progress Notes (Signed)
Endocrinology Consult Note       10/07/2023, 1:27 PM    SUBJECTIVE: Katelyn [name] is a 72 y.o.-year-old female, referred by her  Avis Epley, PA-C  , for evaluation for hypercalcemia/hyperparathyroidism.  She was previously seen by Dr. Everardo All for same who has since left practice.  She declined surgical intervention as a means to treat her hyperparathyroidism in the past.   Past Medical History:  Diagnosis Date   Acute meniscal tear of knee    RIGHT   Arthritis    BACK   Diabetes mellitus, type 2 (HCC)    Hypercholesteremia    Hypertension    Parathyroid disorder [facility])     Past Surgical History:  Procedure Laterality Date   CATARACT EXTRACTION Right    COLONOSCOPY     COLONOSCOPY N/A 12/01/2017   Procedure: COLONOSCOPY;  Surgeon: Corbin Ade, MD;  [facility];  Service: Endoscopy;  Laterality: N/A;  10:45   COLONOSCOPY WITH PROPOFOL N/A 01/21/2023   Procedure: COLONOSCOPY WITH PROPOFOL;  Surgeon: Corbin Ade, MD;  [facility];  Service: Endoscopy;  Laterality: N/A;  945am, asa 2   KNEE ARTHROSCOPY WITH LATERAL MENISECTOMY Right 02/16/2013   Procedure: RIGHT KNEE  ARTHROSCOPY WITH PARTIAL LATERAL MENISECTOMY; Excision of plica with shaving of patella,  partial medial menisectomy;  Surgeon: Drucilla Schmidt, MD;  Location: [facility];  Service: Orthopedics;  Laterality: Right;   POLYPECTOMY  12/01/2017   Procedure: POLYPECTOMY;  Surgeon: Corbin Ade, MD;  [facility];  Service: Endoscopy;;    Social History   Tobacco Use   Smoking status: Never    Passive exposure: Past   Smokeless tobacco: Never  Vaping Use   Vaping status: Never Used  Substance Use Topics   Alcohol use: No   Drug use: No    Family History  Problem Relation Age of Onset   Stroke Mother    Colon cancer Father    Diabetes Father    Breast cancer Daughter 62   Colon  cancer Brother    Hyperparathyroidism Neg Hx     Outpatient Encounter Medications as of 10/07/2023  Medication Sig   ACCU-CHEK AVIVA PLUS test strip 1 each daily.   amLODipine-benazepril (LOTREL) 10-40 MG capsule Take 1 capsule by mouth daily.   aspirin EC 81 MG tablet Take 1 tablet (81 mg total) by mouth daily with breakfast.   cholecalciferol (VITAMIN D) 1000 units tablet Take 1,000 Units by mouth daily.   furosemide (LASIX) 20 MG tablet Take 1 tablet (20 mg total) by mouth daily.   glipiZIDE (GLUCOTROL XL) 10 MG 24 hr tablet Take 1 tablet (10 mg total) by mouth daily with breakfast.   JARDIANCE 25 MG TABS tablet Take 25 mg by mouth daily.   metFORMIN (GLUCOPHAGE) 1000 MG tablet Take 1 tablet (1,000 mg total) by mouth 2 (two) times daily.   metoprolol succinate (TOPROL-XL) 50 MG 24 hr tablet Take 1 tablet (50 mg total) by mouth daily.   OMEGA 3 1000 MG CAPS Take 1,000 mg by mouth daily.   rosuvastatin (CRESTOR) 20 MG tablet Take 1  tablet (20 mg total) by mouth daily.   [DISCONTINUED] cephALEXin (KEFLEX) 500 MG capsule Take 1 capsule (500 mg total) by mouth 3 (three) times daily. (Patient not taking: Reported on 10/07/2023)   No facility-administered encounter medications on file as of 10/07/2023.    No Known Allergies   HPI  Katelyn [name] was diagnosed with hypercalcemia in 2012 at which time she did have NM parathyroid scan showing increased uptake in the right inferior lobe.  Patient has no previously known history of pituitary, adrenal dysfunctions; no family history of such dysfunctions.  She notes Dr. Everardo All was previously monitoring calcium levels periodically and had not yet been started on any medications to reduce calcium levels in the blood.  She does also note she started taking a calcium supplement, at the recommendation of her PCP about a month ago.  I advised her to stop this medication.  -Review of her referral package of most recent labs reveals calcium of 11.5 the  corresponding PTH of 94 on 07/01/23.  I reviewed pt's DEXA scans: 10/25/20 Date of study: 10/25/20 Exam: DUAL X-RAY ABSORPTIOMETRY (DXA) FOR BONE MINERAL DENSITY (BMD) Instrument: [employer] Requesting Provider: PCP Indication: follow up for low BMD Comparison: none (please note that it is not possible to compare data from different instruments) Clinical data: Pt is a 72 y.o. female without previous history of fracture. On calcium and vitamin D.   Results:   Lumbar spine L1-L4 Femoral neck (FN) 33% distal radius  T-score -0.3 RFN: -0.8 LFN: -1.3  -0.9  Change in BMD from previous DXA test (%) Down 1.6% Down 1.8% Up 1.3%  (*) statistically significant   Assessment: the BMD is low according to the [facility] classification for osteoporosis (see below). Fracture risk: moderate FRAX score: 10 year major osteoporotic risk: 4.0%. 10 year hip fracture risk: 0.5%. The thresholds for treatment are 20% and 3%, respectively. Comments: the technical quality of the study is good. Evaluation for secondary causes should be considered if clinically indicated.  Recommend optimizing calcium (1200 mg/day) and vitamin D (800 IU/day) intake.  Followup: Repeat BMD is appropriate after 2 years or after 1-2 years if starting treatment.   WHO criteria for diagnosis of osteoporosis in postmenopausal women and in men 71 y/o or older:  - normal: T-score -1.0 to + 1.0 - osteopenia/low bone density: T-score between -2.5 and -1.0 - osteoporosis: T-score below -2.5 - severe osteoporosis: T-score below -2.5 with history of fragility fracture Note: although not part of the WHO classification, the presence of a fragility fracture, regardless of the T-score, should be considered diagnostic of osteoporosis, provided other causes for the fracture have been excluded.   Treatment: The National Osteoporosis Foundation recommends that treatment be considered in postmenopausal women and men age 43 or older with: 1. Hip or  vertebral (clinical or morphometric) fracture 2. T-score of - 2.5 or lower at the spine or hip 3. 10-year fracture probability by FRAX of at least 20% for a major osteoporotic fracture and 3% for a hip fracture  He does have prior history of fragility fractures and NO history of kidney stones.  She does have moderate CKD. Last BUN/Cr: 15/1.07 on 07/01/23  she is not on HCTZ or other thiazide therapy.  No history of vitamin D deficiency. Last vitamin D level was 69 in 01/20/22.  she is on calcium supplements (started a month ago),  she eats dairy and green, leafy, vegetables on average amounts.  she does not have a family  history of hypercalcemia, pituitary tumors, thyroid cancer, or osteoporosis.   I reviewed her chart and she also has a history of DM, HTN, HLD, obesity, NAFLD.    ROS:  Constitutional: no weight gain/loss, no fatigue, no subjective hyperthermia, no subjective hypothermia Eyes: no blurry vision, no xerophthalmia ENT: no sore throat, no nodules palpated in throat, no dysphagia/odynophagia, no hoarseness Cardiovascular: no Chest Pain, no Shortness of Breath, no palpitations, no leg swelling Respiratory: no cough, no shortness of breath  Gastrointestinal: no Nausea/Vomiting/Diarrhea Musculoskeletal: no muscle/joint aches Skin: no rashes Neurological: no tremors, no numbness, no tingling, no dizziness Psychiatric: no depression, no anxiety    OBJECTIVE:  BP 130/86 (BP Location: Left Arm, Patient Position: Sitting, Cuff Size: Large)   Pulse 71   Ht 5\' 7"  (1.702 m)   Wt 185 lb 12.8 oz (84.3 kg)   BMI 29.10 kg/m , Body mass index is 29.1 kg/m. Wt Readings from Last 3 Encounters:  10/07/23 185 lb 12.8 oz (84.3 kg)  05/10/23 187 lb (84.8 kg)  01/05/23 197 lb (89.4 kg)     Physical Exam- Limited  Constitutional:  Body mass index is 29.1 kg/m. , not in acute distress, normal state of mind Eyes:  EOMI, no exophthalmos Neck: Supple Cardiovascular: RRR, no murmurs,  rubs, or gallops, no edema Respiratory: Adequate breathing efforts, no crackles, rales, rhonchi, or wheezing Musculoskeletal: no gross deformities, strength intact in all four extremities, no gross restriction of joint movements Skin:  no rashes, no hyperemia Neurological: no tremor with outstretched hands   CMP ( most recent) CMP     Component Value Date/Time   NA 133 (L) 01/08/2023 0811   K 3.3 (L) 01/08/2023 0811   CL 101 01/08/2023 0811   CO2 22 01/08/2023 0811   GLUCOSE 172 (H) 01/08/2023 0811   BUN 15 07/01/2023 0000   CREATININE 1.1 07/01/2023 0000   CREATININE 1.01 (H) 01/08/2023 0811   CALCIUM 11.5 (A) 07/01/2023 0000   PROT 7.8 04/15/2022 0852   ALBUMIN 3.2 (L) 04/15/2022 0852   AST 31 04/15/2022 0852   ALT 31 04/15/2022 0852   ALKPHOS 100 04/15/2022 0852   BILITOT 0.4 04/15/2022 0852   GFR 97.62 09/23/2018 0950   EGFR 56 07/01/2023 0000   GFRNONAA 60 (L) 01/08/2023 0811     Diabetic Labs (most recent): Lab Results  Component Value Date   HGBA1C 7.0 (H) 04/15/2022     Lipid Panel ( most recent) Lipid Panel  No results found for: "CHOL", "TRIG", "HDL", "CHOLHDL", "VLDL", "LDLCALC", "LDLDIRECT", "LABVLDL"    Lab Results  Component Value Date   TSH 1.55 07/01/2023   TSH 2.33 09/25/2019      Assessment: 1. Hypercalcemia / Hyperparathyroidism  Plan: Patient has had several instances of elevated calcium, with the highest level being at 11.5 mg/dL. A corresponding intact PTH level was also high, at 94.   - She does not have apparent complications from hypercalcemia/hyperparathyroidism:  No abdominal pain, no major mood disorders, no bone pain.  There was mention of kidney stones in her previous notes, but she assures me this is inaccurate.  - I discussed with the patient about the physiology of calcium and parathyroid hormone, and possible  effects of  increased PTH/ Calcium , including kidney stones, cardiac dysrhythmias, osteoporosis, abdominal pain,  etc.   - I will request for her next DEXA scan to include the distal  33% of  radius for evaluation of cortical bone, which is predominantly affected by hyperparathyroidism.   -  She has declined surgical intervention as means to treat her hyperparathyroidism in the past. If calcium levels are still elevated over 11.1, may need treatment with Sensipar.    - Time spent with the patient: 45 minutes, of which >50% was spent in obtaining information about her symptoms, reviewing her previous labs, evaluations, and treatments, counseling her about her  hypercalcemia/hyperparathyroidism , and developing a plan to confirm the diagnosis and long term treatment as necessary.  Please refer to " Patient Self Inventory" in the Media  tab for reviewed elements of pertinent patient history.  Renold Genta participated in the discussions, expressed understanding, and voiced agreement with the above plans.  All questions were answered to her satisfaction. she is encouraged to contact clinic should she have any questions or concerns prior to her return visit.   FOLLOW UP PLAN: - Return will call with lab results and plan moving forward., for hypercalcemia.   Ronny Bacon, [facility] [address] [place], [place] [address] Phone: [phone] Fax: [phone]  10/07/2023, 1:27 PM

## 2023-10-07 NOTE — Patient Instructions (Signed)
Hypercalcemia Hypercalcemia is when the level of calcium in a person's blood is above normal. The body needs calcium to make bones and keep them strong. Calcium also helps the muscles, nerves, brain, and heart work the way they should. Most of the calcium in the body is stored in the bones. There is also calcium in the blood. Hypercalcemia occurs when there is too much calcium in your blood. Calcium levels in the blood are regulated by hormones, kidneys, and the gastrointestinal tract.  Hypercalcemia can happen when calcium comes out of the bones, or when the kidneys are not able to remove calcium from the blood. Hypercalcemia can be mild or severe. What are the causes? There are many possible causes of hypercalcemia. Common causes of this condition include: Hyperparathyroidism. This is a condition in which the body produces too much parathyroid hormone. There are four parathyroid glands in your neck. These glands produce a chemical messenger (hormone) that helps the body absorb calcium from foods and helps your bones release calcium. Certain kinds of cancer. Less common causes of hypercalcemia include: Calcium and vitamin D dietary supplements. Chronic kidney disease. Hyperthyroidism. Severe dehydration. Being on bed rest or being inactive for a long time. Certain medicines. Infections. What increases the risk? You are more likely to develop this condition if: You are female. You are 44 years of age or older. You have a family history of hypercalcemia. What are the signs or symptoms? Mild hypercalcemia that starts slowly may not cause symptoms. Severe, sudden hypercalcemia is more likely to cause symptoms, such as: Being more thirsty than usual. Needing to urinate more often than usual. Abdominal pain. Nausea and vomiting. Constipation. Muscle pain, twitching, or weakness. Feeling very tired. How is this diagnosed?  Hypercalcemia is usually diagnosed with a blood test. You may also  have tests to help check what is causing this condition. Tests include imaging tests and more blood tests. How is this treated? Treatment for hypercalcemia depends on the cause. Treatment may include: Receiving fluids through an IV. Medicines. These can be used to: Keep calcium levels steady after receiving fluids (loop diuretics). Keep calcium in your bones (bisphosphonates). Lower the calcium level in your blood. Surgery to remove overactive parathyroid glands. A procedure that filters your blood to correct calcium levels (hemodialysis). Follow these instructions at home:  Take over-the-counter and prescription medicines only as told by your health care provider. Follow instructions from your health care provider about eating or drinking restrictions. Drink enough fluid to keep your urine pale yellow. Stay active. Weight-bearing exercise helps to keep calcium in your bones. Follow instructions from your health care provider about what type and level of exercise is safe for you. Keep all follow-up visits. This is important. Contact a health care provider if: You have a fever. Your heartbeat is irregular or very fast. You have changes in mood, memory, or personality. Get help right away if: You have severe abdominal pain. You have chest pain. You have trouble breathing. You become very confused and sleepy. You lose consciousness. These symptoms may represent a serious problem that is an emergency. Do not wait to see if the symptoms will go away. Get medical help right away. Call your local emergency services (911 in the [place] Do not drive yourself to the hospital. Summary Hypercalcemia is when the level of calcium in a person's blood is above normal. The body needs calcium to make bones and keep them strong. There are many possible causes of hypercalcemia, and treatment depends on  the cause. Take over-the-counter and prescription medicines only as told by your health care  provider. This information is not intended to replace advice given to you by your health care provider. Make sure you discuss any questions you have with your health care provider. Document Revised: 03/12/2021 Document Reviewed: 03/12/2021 Elsevier Patient Education  2024 [employer].

## 2023-10-08 DIAGNOSIS — N8111 Cystocele, midline: Secondary | ICD-10-CM | POA: Diagnosis not present

## 2023-10-08 LAB — COMPREHENSIVE METABOLIC PANEL
ALT: 19 [IU]/L (ref 0–32)
AST: 17 [IU]/L (ref 0–40)
Albumin: 4.5 g/dL (ref 3.8–4.8)
Alkaline Phosphatase: 90 [IU]/L (ref 44–121)
BUN/Creatinine Ratio: 19 (ref 12–28)
BUN: 22 mg/dL (ref 8–27)
Bilirubin Total: <0.2 mg/dL (ref 0.0–1.2)
CO2: 20 mmol/L (ref 20–29)
Calcium: 11.6 mg/dL — ABNORMAL HIGH (ref 8.7–10.3)
Chloride: 105 mmol/L (ref 96–106)
Creatinine, Ser: 1.17 mg/dL — ABNORMAL HIGH (ref 0.57–1.00)
Globulin, Total: 3.1 g/dL (ref 1.5–4.5)
Glucose: 72 mg/dL (ref 70–99)
Potassium: 3.7 mmol/L (ref 3.5–5.2)
Sodium: 144 mmol/L (ref 134–144)
Total Protein: 7.6 g/dL (ref 6.0–8.5)
eGFR: 50 mL/min/{1.73_m2} — ABNORMAL LOW (ref >59)

## 2023-10-08 LAB — PTH, INTACT AND CALCIUM: PTH: 47 pg/mL (ref 15–65)

## 2023-10-08 LAB — VITAMIN D 25 HYDROXY (VIT D DEFICIENCY, FRACTURES): Vit D, 25-Hydroxy: 66.3 ng/mL (ref 30.0–100.0)

## 2023-10-11 ENCOUNTER — Encounter: Payer: Self-pay | Admitting: Nurse Practitioner

## 2023-10-11 ENCOUNTER — Other Ambulatory Visit: Payer: Self-pay | Admitting: Nurse Practitioner

## 2023-10-11 ENCOUNTER — Telehealth: Payer: Self-pay | Admitting: *Deleted

## 2023-10-11 DIAGNOSIS — E213 Hyperparathyroidism, unspecified: Secondary | ICD-10-CM

## 2023-10-11 NOTE — Telephone Encounter (Signed)
-----   Message from Dani Gobble sent at 10/11/2023  8:24 AM EST ----- FYI: I sent mychart message going over recent labs.  She needs more labs which we can mail to her.

## 2023-10-11 NOTE — Telephone Encounter (Signed)
Noted , Katelyn [name] has sent the patient a message going over her recent labs. Updated lab work request mailed to the patient.

## 2023-10-11 NOTE — Progress Notes (Signed)
FYI: I sent mychart message going over recent labs.  She needs more labs which we can mail to her.

## 2023-10-14 ENCOUNTER — Telehealth: Payer: Self-pay | Admitting: *Deleted

## 2023-10-14 NOTE — Telephone Encounter (Signed)
Orders have been mailed to the patient.

## 2023-10-14 NOTE — Telephone Encounter (Signed)
-----   Message from Dani Gobble sent at 10/11/2023  8:24 AM EST ----- FYI: I sent mychart message going over recent labs.  She needs more labs which we can mail to her.

## 2023-10-18 DIAGNOSIS — E213 Hyperparathyroidism, unspecified: Secondary | ICD-10-CM | POA: Diagnosis not present

## 2023-10-21 DIAGNOSIS — E213 Hyperparathyroidism, unspecified: Secondary | ICD-10-CM | POA: Diagnosis not present

## 2023-10-22 LAB — CREATININE, URINE, 24 HOUR
Creatinine, 24H Ur: 1651 mg/(24.h) (ref 800–1800)
Creatinine, Urine: 63.5 mg/dL

## 2023-10-22 LAB — CALCIUM, URINE, 24 HOUR
Calcium, 24H Urine: 304 mg/(24.h) (ref 0–320)
Calcium, Urine: 11.7 mg/dL

## 2023-10-24 LAB — PTH, INTACT AND CALCIUM
Calcium: 11 mg/dL — ABNORMAL HIGH (ref 8.7–10.3)
PTH: 38 pg/mL (ref 15–65)

## 2023-10-24 LAB — PTH-RELATED PEPTIDE: PTH-related peptide: <2 pmol/L

## 2023-10-24 LAB — MAGNESIUM: Magnesium: 2.1 mg/dL (ref 1.6–2.3)

## 2023-10-24 LAB — PHOSPHORUS: Phosphorus: 3 mg/dL (ref 3.0–4.3)

## 2023-10-25 ENCOUNTER — Encounter: Payer: Self-pay | Admitting: Nurse Practitioner

## 2023-10-25 ENCOUNTER — Other Ambulatory Visit: Payer: Self-pay | Admitting: Nurse Practitioner

## 2023-10-25 DIAGNOSIS — E213 Hyperparathyroidism, unspecified: Secondary | ICD-10-CM

## 2023-10-26 NOTE — Telephone Encounter (Signed)
 I called pt and told her to read my chart message

## 2023-10-26 NOTE — Telephone Encounter (Signed)
-----   Message from Dani Gobble sent at 10/25/2023 12:44 PM EST ----- Please schedule in office follow up in 3 months with previsit labs.

## 2023-12-31 DIAGNOSIS — Z6829 Body mass index (BMI) 29.0-29.9, adult: Secondary | ICD-10-CM | POA: Diagnosis not present

## 2023-12-31 DIAGNOSIS — I10 Essential (primary) hypertension: Secondary | ICD-10-CM | POA: Diagnosis not present

## 2023-12-31 DIAGNOSIS — Z0001 Encounter for general adult medical examination with abnormal findings: Secondary | ICD-10-CM | POA: Diagnosis not present

## 2023-12-31 DIAGNOSIS — E782 Mixed hyperlipidemia: Secondary | ICD-10-CM | POA: Diagnosis not present

## 2023-12-31 DIAGNOSIS — E663 Overweight: Secondary | ICD-10-CM | POA: Diagnosis not present

## 2023-12-31 DIAGNOSIS — N181 Chronic kidney disease, stage 1: Secondary | ICD-10-CM | POA: Diagnosis not present

## 2023-12-31 DIAGNOSIS — E210 Primary hyperparathyroidism: Secondary | ICD-10-CM | POA: Diagnosis not present

## 2023-12-31 DIAGNOSIS — N390 Urinary tract infection, site not specified: Secondary | ICD-10-CM | POA: Diagnosis not present

## 2023-12-31 DIAGNOSIS — E1122 Type 2 diabetes mellitus with diabetic chronic kidney disease: Secondary | ICD-10-CM | POA: Diagnosis not present

## 2023-12-31 DIAGNOSIS — N8110 Cystocele, unspecified: Secondary | ICD-10-CM | POA: Diagnosis not present

## 2023-12-31 LAB — BASIC METABOLIC PANEL WITH GFR
BUN: 20 (ref 4–21)
CO2: 18 (ref 13–22)
Chloride: 106 (ref 99–108)
Creatinine: 1 (ref 0.5–1.1)
Glucose: 82
Potassium: 4.4 meq/L (ref 3.5–5.1)
Sodium: 143 (ref 137–147)

## 2023-12-31 LAB — LIPID PANEL
Cholesterol: 133 (ref 0–200)
HDL: 44 (ref 35–70)
LDL Cholesterol: 65
Triglycerides: 137 (ref 40–160)

## 2023-12-31 LAB — COMPREHENSIVE METABOLIC PANEL WITH GFR
Albumin: 4.7 (ref 3.5–5.0)
Calcium: 11.7 — AB (ref 8.7–10.7)
Globulin: 2.8

## 2023-12-31 LAB — HEPATIC FUNCTION PANEL
ALT: 17 U/L (ref 7–35)
AST: 14 (ref 13–35)
Alkaline Phosphatase: 82 (ref 25–125)

## 2024-01-21 DIAGNOSIS — E213 Hyperparathyroidism, unspecified: Secondary | ICD-10-CM | POA: Diagnosis not present

## 2024-01-22 LAB — COMPREHENSIVE METABOLIC PANEL WITH GFR
ALT: 21 IU/L (ref 0–32)
AST: 21 IU/L (ref 0–40)
Albumin: 4.5 g/dL (ref 3.8–4.8)
Alkaline Phosphatase: 89 IU/L (ref 44–121)
BUN/Creatinine Ratio: 15 (ref 12–28)
BUN: 18 mg/dL (ref 8–27)
Bilirubin Total: <0.2 mg/dL (ref 0.0–1.2)
CO2: 19 mmol/L — ABNORMAL LOW (ref 20–29)
Calcium: 10.6 mg/dL — ABNORMAL HIGH (ref 8.7–10.3)
Chloride: 107 mmol/L — ABNORMAL HIGH (ref 96–106)
Creatinine, Ser: 1.21 mg/dL — ABNORMAL HIGH (ref 0.57–1.00)
Globulin, Total: 2.4 g/dL (ref 1.5–4.5)
Glucose: 72 mg/dL (ref 70–99)
Potassium: 4.6 mmol/L (ref 3.5–5.2)
Sodium: 141 mmol/L (ref 134–144)
Total Protein: 6.9 g/dL (ref 6.0–8.5)
eGFR: 48 mL/min/{1.73_m2} — ABNORMAL LOW (ref >59)

## 2024-01-22 LAB — PTH, INTACT AND CALCIUM: PTH: 92 pg/mL — ABNORMAL HIGH (ref 15–65)

## 2024-01-24 ENCOUNTER — Ambulatory Visit (INDEPENDENT_AMBULATORY_CARE_PROVIDER_SITE_OTHER): Payer: Medicare HMO | Admitting: Nurse Practitioner

## 2024-01-24 ENCOUNTER — Encounter: Payer: Self-pay | Admitting: Nurse Practitioner

## 2024-01-24 VITALS — BP 122/88 | HR 63 | Ht 67.0 in | Wt 185.0 lb

## 2024-01-24 DIAGNOSIS — E213 Hyperparathyroidism, unspecified: Secondary | ICD-10-CM

## 2024-01-24 NOTE — Progress Notes (Signed)
 Endocrinology Follow Up Note       01/24/2024, 11:08 AM    SUBJECTIVE: Katelyn [name] is a 73 y.o.-year-old female, referred by her  Katelyn Epley, PA-C  , for evaluation for hypercalcemia/hyperparathyroidism.  She was previously seen by Dr. Everardo All for same who has since left practice.  She declined surgical intervention as a means to treat her hyperparathyroidism in the past.   Past Medical History:  Diagnosis Date   Acute meniscal tear of knee    RIGHT   Arthritis    BACK   Diabetes mellitus, type 2 (HCC)    Hypercholesteremia    Hypertension    Parathyroid disorder [facility])     Past Surgical History:  Procedure Laterality Date   CATARACT EXTRACTION Right    COLONOSCOPY     COLONOSCOPY N/A 12/01/2017   Procedure: COLONOSCOPY;  Surgeon: Corbin Ade, MD;  [facility];  Service: Endoscopy;  Laterality: N/A;  10:45   COLONOSCOPY WITH PROPOFOL N/A 01/21/2023   Procedure: COLONOSCOPY WITH PROPOFOL;  Surgeon: Corbin Ade, MD;  [facility];  Service: Endoscopy;  Laterality: N/A;  945am, asa 2   KNEE ARTHROSCOPY WITH LATERAL MENISECTOMY Right 02/16/2013   Procedure: RIGHT KNEE  ARTHROSCOPY WITH PARTIAL LATERAL MENISECTOMY; Excision of plica with shaving of patella,  partial medial menisectomy;  Surgeon: Drucilla Schmidt, MD;  Location: [facility];  Service: Orthopedics;  Laterality: Right;   POLYPECTOMY  12/01/2017   Procedure: POLYPECTOMY;  Surgeon: Corbin Ade, MD;  [facility];  Service: Endoscopy;;    Social History   Tobacco Use   Smoking status: Never    Passive exposure: Past   Smokeless tobacco: Never  Vaping Use   Vaping status: Never Used  Substance Use Topics   Alcohol use: No   Drug use: No    Family History  Problem Relation Age of Onset   Stroke Mother    Colon cancer Father    Diabetes Father    Breast cancer Daughter 31   Colon  cancer Brother    Hyperparathyroidism Neg Hx     Outpatient Encounter Medications as of 01/24/2024  Medication Sig   ACCU-CHEK AVIVA PLUS test strip 1 each daily.   amLODipine-benazepril (LOTREL) 10-40 MG capsule Take 1 capsule by mouth daily.   aspirin EC 81 MG tablet Take 1 tablet (81 mg total) by mouth daily with breakfast.   cholecalciferol (VITAMIN D) 1000 units tablet Take 1,000 Units by mouth daily.   furosemide (LASIX) 20 MG tablet Take 1 tablet (20 mg total) by mouth daily.   glipiZIDE (GLUCOTROL XL) 10 MG 24 hr tablet Take 1 tablet (10 mg total) by mouth daily with breakfast.   JARDIANCE 25 MG TABS tablet Take 25 mg by mouth daily.   metFORMIN (GLUCOPHAGE) 1000 MG tablet Take 1 tablet (1,000 mg total) by mouth 2 (two) times daily.   metoprolol succinate (TOPROL-XL) 50 MG 24 hr tablet Take 1 tablet (50 mg total) by mouth daily.   OMEGA 3 1000 MG CAPS Take 1,000 mg by mouth daily.   rosuvastatin (CRESTOR) 20 MG tablet Take  1 tablet (20 mg total) by mouth daily.   No facility-administered encounter medications on file as of 01/24/2024.    No Known Allergies   HPI  [name] was diagnosed with hypercalcemia in 2012 at which time she did have NM parathyroid scan showing increased uptake in the right inferior lobe.  Patient has no previously known history of pituitary, adrenal dysfunctions; no family history of such dysfunctions.  She notes Dr. Everardo All was previously monitoring calcium levels periodically and had not yet been started on any medications to reduce calcium levels in the blood.    I reviewed pt's DEXA scans: 10/25/20 Date of study: 10/25/20 Exam: DUAL X-RAY ABSORPTIOMETRY (DXA) FOR BONE MINERAL DENSITY (BMD) Instrument: [employer] Requesting Provider: PCP Indication: follow up for low BMD Comparison: none (please note that it is not possible to compare data from different instruments) Clinical data: Pt is a 73 y.o. female without previous history of  fracture. On calcium and vitamin D.   Results:   Lumbar spine L1-L4 Femoral neck (FN) 33% distal radius  T-score -0.3 RFN: -0.8 LFN: -1.3  -0.9  Change in BMD from previous DXA test (%) Down 1.6% Down 1.8% Up 1.3%  (*) statistically significant   Assessment: the BMD is low according to the [facility] classification for osteoporosis (see below). Fracture risk: moderate FRAX score: 10 year major osteoporotic risk: 4.0%. 10 year hip fracture risk: 0.5%. The thresholds for treatment are 20% and 3%, respectively. Comments: the technical quality of the study is good. Evaluation for secondary causes should be considered if clinically indicated.  Recommend optimizing calcium (1200 mg/day) and vitamin D (800 IU/day) intake.  Followup: Repeat BMD is appropriate after 2 years or after 1-2 years if starting treatment.   WHO criteria for diagnosis of osteoporosis in postmenopausal women and in men 2 y/o or older:  - normal: T-score -1.0 to + 1.0 - osteopenia/low bone density: T-score between -2.5 and -1.0 - osteoporosis: T-score below -2.5 - severe osteoporosis: T-score below -2.5 with history of fragility fracture Note: although not part of the WHO classification, the presence of a fragility fracture, regardless of the T-score, should be considered diagnostic of osteoporosis, provided other causes for the fracture have been excluded.   Treatment: The National Osteoporosis Foundation recommends that treatment be considered in postmenopausal women and men age 45 or older with: 1. Hip or vertebral (clinical or morphometric) fracture 2. T-score of - 2.5 or lower at the spine or hip 3. 10-year fracture probability by FRAX of at least 20% for a major osteoporotic fracture and 3% for a hip fracture  He does have prior history of fragility fractures and NO history of kidney stones.  She does have moderate CKD. Last BUN/Cr: 15/1.07 on 07/01/23  she is not on HCTZ or other thiazide therapy.  No history of  vitamin D deficiency. Last vitamin D level was 69 in 01/20/22.  she is on calcium supplements (started a month ago),  she eats dairy and green, leafy, vegetables on average amounts.  she does not have a family history of hypercalcemia, pituitary tumors, thyroid cancer, or osteoporosis.   I reviewed her chart and she also has a history of DM, HTN, HLD, obesity, NAFLD.    Review of systems  Constitutional: + Minimally fluctuating body weight,  current Body mass index is 28.98 kg/m. , no fatigue, no subjective hyperthermia, no subjective hypothermia Eyes: no blurry vision, no xerophthalmia ENT: no sore throat, no nodules palpated in throat, no  dysphagia/odynophagia, no hoarseness Cardiovascular: no chest pain, no shortness of breath, no palpitations, no leg swelling Respiratory: no cough, no shortness of breath Gastrointestinal: no nausea/vomiting/diarrhea Musculoskeletal: no muscle/joint aches Skin: no rashes, no hyperemia Neurological: no tremors, no numbness, no tingling, no dizziness Psychiatric: no depression, no anxiety    OBJECTIVE:  BP 122/88 (BP Location: Left Arm, Patient Position: Sitting, Cuff Size: Large)   Pulse 63   Ht 5\' 7"  (1.702 m)   Wt 185 lb (83.9 kg)   BMI 28.98 kg/m , Body mass index is 28.98 kg/m. Wt Readings from Last 3 Encounters:  01/24/24 185 lb (83.9 kg)  10/07/23 185 lb 12.8 oz (84.3 kg)  05/10/23 187 lb (84.8 kg)     Physical Exam- Limited  Constitutional:  Body mass index is 28.98 kg/m. , not in acute distress, normal state of mind Eyes:  EOMI, no exophthalmos Musculoskeletal: no gross deformities, strength intact in all four extremities, no gross restriction of joint movements Skin:  no rashes, no hyperemia Neurological: no tremor with outstretched hands   CMP ( most recent) CMP     Component Value Date/Time   NA 141 01/21/2024 0748   K 4.6 01/21/2024 0748   CL 107 (H) 01/21/2024 0748   CO2 19 (L) 01/21/2024 0748   GLUCOSE 72  01/21/2024 0748   GLUCOSE 172 (H) 01/08/2023 0811   BUN 18 01/21/2024 0748   CREATININE 1.21 (H) 01/21/2024 0748   CALCIUM 10.6 (H) 01/21/2024 0748   PROT 6.9 01/21/2024 0748   ALBUMIN 4.5 01/21/2024 0748   AST 21 01/21/2024 0748   ALT 21 01/21/2024 0748   ALKPHOS 89 01/21/2024 0748   BILITOT <0.2 01/21/2024 0748   GFR 97.62 09/23/2018 0950   EGFR 48 (L) 01/21/2024 0748   GFRNONAA 60 (L) 01/08/2023 0811     Diabetic Labs (most recent): Lab Results  Component Value Date   HGBA1C 7.0 (H) 04/15/2022     Lipid Panel ( most recent) Lipid Panel     Component Value Date/Time   CHOL 133 12/31/2023 0000   TRIG 137 12/31/2023 0000   HDL 44 12/31/2023 0000   LDLCALC 65 12/31/2023 0000      Lab Results  Component Value Date   TSH 1.55 07/01/2023   TSH 2.33 09/25/2019     Latest Reference Range & Units 01/21/24 07:48  COMPREHENSIVE METABOLIC PANEL WITH GFR  Rpt !  Sodium 134 - 144 mmol/L 141  Potassium 3.5 - 5.2 mmol/L 4.6  Chloride 96 - 106 mmol/L 107 (H)  CO2 20 - 29 mmol/L 19 (L)  Glucose 70 - 99 mg/dL 72  BUN 8 - 27 mg/dL 18  Creatinine 5.40 - 9.81 mg/dL 1.91 (H)  Calcium 8.7 - 10.3 mg/dL 47.8 (H)  BUN/Creatinine Ratio 12 - 28  15  eGFR >59 mL/min/1.73 48 (L)  Alkaline Phosphatase 44 - 121 IU/L 89  Albumin 3.8 - 4.8 g/dL 4.5  AST 0 - 40 IU/L 21  ALT 0 - 32 IU/L 21  Total Protein 6.0 - 8.5 g/dL 6.9  Total Bilirubin 0.0 - 1.2 mg/dL <2.9  Globulin, Total 1.5 - 4.5 g/dL 2.4  PTH, Intact 15 - 65 pg/mL 92 (H)  PTH Interp  Comment  !: Data is abnormal (H): Data is abnormally high (L): Data is abnormally low Rpt: View report in Results Review for more information  Assessment: 1. Hypercalcemia / Hyperparathyroidism  Plan: Patient has had several instances of elevated calcium, with the highest level being at  11.5 mg/dL. A corresponding intact PTH level was also high, at 94.   Repeat calcium level has improved to 10.6 with elevated PTH of 92. The urine test to check  for FHH (familial hypocalciuric hypercalcemia), a genetic trait that sometimes causes high calcium levels, was normal, ruling that out as possible etiology.  -She has and continues to decline surgical intervention as means to treat her hyperparathyroidism.  Will recheck CMP, PTH, calcium, and vitamin D prior to next visit.  She is to avoid any calcium supplements moving forward.  If calcium levels are still elevated over 11.1, may need treatment with Sensipar.   I spent  38  minutes in the care of the patient today including review of labs from paraThyroid Function, CMP, and other relevant labs ; imaging/biopsy records (current and previous including abstractions from other facilities); face-to-face time discussing  her lab results and symptoms, medications doses, her options of short and long term treatment based on the latest standards of care / guidelines;   and documenting the encounter.  Renold Genta  participated in the discussions, expressed understanding, and voiced agreement with the above plans.  All questions were answered to her satisfaction. she is encouraged to contact clinic should she have any questions or concerns prior to her return visit.   FOLLOW UP PLAN: - Return in about 4 months (around 05/25/2024) for hypercalcemia follow up with previsit labs.   Ronny Bacon, [facility] [address] [place], [place] [address] Phone: [phone] Fax: [phone]  01/24/2024, 11:08 AM

## 2024-02-09 DIAGNOSIS — H43813 Vitreous degeneration, bilateral: Secondary | ICD-10-CM | POA: Diagnosis not present

## 2024-02-09 DIAGNOSIS — E119 Type 2 diabetes mellitus without complications: Secondary | ICD-10-CM | POA: Diagnosis not present

## 2024-02-09 DIAGNOSIS — H2513 Age-related nuclear cataract, bilateral: Secondary | ICD-10-CM | POA: Diagnosis not present

## 2024-02-14 DIAGNOSIS — E119 Type 2 diabetes mellitus without complications: Secondary | ICD-10-CM | POA: Diagnosis not present

## 2024-02-14 DIAGNOSIS — Z961 Presence of intraocular lens: Secondary | ICD-10-CM | POA: Diagnosis not present

## 2024-02-14 DIAGNOSIS — H43813 Vitreous degeneration, bilateral: Secondary | ICD-10-CM | POA: Diagnosis not present

## 2024-02-14 DIAGNOSIS — H2512 Age-related nuclear cataract, left eye: Secondary | ICD-10-CM | POA: Diagnosis not present

## 2024-03-27 ENCOUNTER — Encounter: Payer: Self-pay | Admitting: Gastroenterology

## 2024-03-31 DIAGNOSIS — E1159 Type 2 diabetes mellitus with other circulatory complications: Secondary | ICD-10-CM | POA: Diagnosis not present

## 2024-03-31 DIAGNOSIS — E6609 Other obesity due to excess calories: Secondary | ICD-10-CM | POA: Diagnosis not present

## 2024-03-31 DIAGNOSIS — E1122 Type 2 diabetes mellitus with diabetic chronic kidney disease: Secondary | ICD-10-CM | POA: Diagnosis not present

## 2024-03-31 DIAGNOSIS — Z6830 Body mass index (BMI) 30.0-30.9, adult: Secondary | ICD-10-CM | POA: Diagnosis not present

## 2024-03-31 DIAGNOSIS — I10 Essential (primary) hypertension: Secondary | ICD-10-CM | POA: Diagnosis not present

## 2024-05-10 DIAGNOSIS — E213 Hyperparathyroidism, unspecified: Secondary | ICD-10-CM | POA: Diagnosis not present

## 2024-05-12 LAB — COMPREHENSIVE METABOLIC PANEL WITH GFR
ALT: 28 IU/L (ref 0–32)
AST: 21 IU/L (ref 0–40)
Albumin: 4.3 g/dL (ref 3.8–4.8)
Alkaline Phosphatase: 88 IU/L (ref 44–121)
BUN/Creatinine Ratio: 16 (ref 12–28)
BUN: 16 mg/dL (ref 8–27)
Bilirubin Total: 0.3 mg/dL (ref 0.0–1.2)
CO2: 19 mmol/L — ABNORMAL LOW (ref 20–29)
Calcium: 10.8 mg/dL — ABNORMAL HIGH (ref 8.7–10.3)
Chloride: 107 mmol/L — ABNORMAL HIGH (ref 96–106)
Creatinine, Ser: 1 mg/dL (ref 0.57–1.00)
Globulin, Total: 3 g/dL (ref 1.5–4.5)
Glucose: 88 mg/dL (ref 70–99)
Potassium: 4.9 mmol/L (ref 3.5–5.2)
Sodium: 144 mmol/L (ref 134–144)
Total Protein: 7.3 g/dL (ref 6.0–8.5)
eGFR: 60 mL/min/1.73 (ref >59)

## 2024-05-12 LAB — PTH, INTACT AND CALCIUM: PTH: 88 pg/mL — AB (ref 15–65)

## 2024-05-12 LAB — VITAMIN D 25 HYDROXY (VIT D DEFICIENCY, FRACTURES): Vit D, 25-Hydroxy: 31.2 ng/mL (ref 30.0–100.0)

## 2024-05-26 ENCOUNTER — Ambulatory Visit: Admitting: Nurse Practitioner

## 2024-05-26 ENCOUNTER — Encounter: Payer: Self-pay | Admitting: Nurse Practitioner

## 2024-05-26 VITALS — BP 132/80 | HR 62 | Ht 67.0 in | Wt 190.6 lb

## 2024-05-26 DIAGNOSIS — E213 Hyperparathyroidism, unspecified: Secondary | ICD-10-CM | POA: Diagnosis not present

## 2024-05-26 NOTE — Progress Notes (Signed)
 Endocrinology Follow Up Note       05/26/2024, 8:25 AM    SUBJECTIVE: Katelyn [name] is a 73 y.o.-year-old female, referred by her  Leonce Lucie PARAS, PA-C  , for evaluation for hypercalcemia/hyperparathyroidism.  She was previously seen by Dr. Kassie for same who has since left practice.  She declined surgical intervention as a means to treat her hyperparathyroidism in the past.   Past Medical History:  Diagnosis Date   Acute meniscal tear of knee    RIGHT   Arthritis    BACK   Diabetes mellitus, type 2 (HCC)    Hypercholesteremia    Hypertension    Parathyroid  disorder [facility])     Past Surgical History:  Procedure Laterality Date   CATARACT EXTRACTION Right    COLONOSCOPY     COLONOSCOPY N/A 12/01/2017   Procedure: COLONOSCOPY;  Surgeon: Shaaron Lamar HERO, MD;  [facility];  Service: Endoscopy;  Laterality: N/A;  10:45   COLONOSCOPY WITH PROPOFOL  N/A 01/21/2023   Procedure: COLONOSCOPY WITH PROPOFOL ;  Surgeon: Shaaron Lamar HERO, MD;  [facility];  Service: Endoscopy;  Laterality: N/A;  945am, asa 2   KNEE ARTHROSCOPY WITH LATERAL MENISECTOMY Right 02/16/2013   Procedure: RIGHT KNEE  ARTHROSCOPY WITH PARTIAL LATERAL MENISECTOMY; Excision of plica with shaving of patella,  partial medial menisectomy;  Surgeon: Lynwood SHAUNNA Bern, MD;  Location: [facility];  Service: Orthopedics;  Laterality: Right;   POLYPECTOMY  12/01/2017   Procedure: POLYPECTOMY;  Surgeon: Shaaron Lamar HERO, MD;  [facility];  Service: Endoscopy;;    Social History   Tobacco Use   Smoking status: Never    Passive exposure: Past   Smokeless tobacco: Never  Vaping Use   Vaping status: Never Used  Substance Use Topics   Alcohol use: No   Drug use: No    Family History  Problem Relation Age of Onset   Stroke Mother    Colon cancer Father    Diabetes Father    Breast cancer Daughter 60   Colon  cancer Brother    Hyperparathyroidism Neg Hx     Outpatient Encounter Medications as of 05/26/2024  Medication Sig   ACCU-CHEK AVIVA PLUS test strip 1 each daily.   amLODipine -benazepril  (LOTREL) 10-40 MG capsule Take 1 capsule by mouth daily.   aspirin  EC 81 MG tablet Take 1 tablet (81 mg total) by mouth daily with breakfast.   cholecalciferol (VITAMIN D ) 1000 units tablet Take 1,000 Units by mouth daily.   furosemide  (LASIX ) 20 MG tablet Take 1 tablet (20 mg total) by mouth daily.   glipiZIDE  (GLUCOTROL  XL) 10 MG 24 hr tablet Take 1 tablet (10 mg total) by mouth daily with breakfast.   JARDIANCE  25 MG TABS tablet Take 25 mg by mouth daily.   metFORMIN  (GLUCOPHAGE ) 1000 MG tablet Take 1 tablet (1,000 mg total) by mouth 2 (two) times daily.   metoprolol  succinate (TOPROL -XL) 50 MG 24 hr tablet Take 1 tablet (50 mg total) by mouth daily.   OMEGA 3 1000 MG CAPS Take 1,000 mg by mouth daily.   rosuvastatin  (CRESTOR ) 20 MG tablet Take  1 tablet (20 mg total) by mouth daily.   No facility-administered encounter medications on file as of 05/26/2024.    No Known Allergies   HPI  Katelyn [name] was diagnosed with hypercalcemia in 2012 at which time she did have NM parathyroid  scan showing increased uptake in the right inferior lobe.  Patient has no previously known history of pituitary, adrenal dysfunctions; no family history of such dysfunctions.  She notes Dr. Kassie was previously monitoring calcium  levels periodically and had not yet been started on any medications to reduce calcium  levels in the blood.    I reviewed pt's DEXA scans: 10/25/20 Date of study: 10/25/20 Exam: DUAL X-RAY ABSORPTIOMETRY (DXA) FOR BONE MINERAL DENSITY (BMD) Instrument: [employer] Requesting Provider: PCP Indication: follow up for low BMD Comparison: none (please note that it is not possible to compare data from different instruments) Clinical data: Pt is a 73 y.o. female without previous history of  fracture. On calcium  and vitamin D .   Results:   Lumbar spine L1-L4 Femoral neck (FN) 33% distal radius  T-score -0.3 RFN: -0.8 LFN: -1.3  -0.9  Change in BMD from previous DXA test (%) Down 1.6% Down 1.8% Up 1.3%  (*) statistically significant   Assessment: the BMD is low according to the [facility] classification for osteoporosis (see below). Fracture risk: moderate FRAX score: 10 year major osteoporotic risk: 4.0%. 10 year hip fracture risk: 0.5%. The thresholds for treatment are 20% and 3%, respectively. Comments: the technical quality of the study is good. Evaluation for secondary causes should be considered if clinically indicated.  Recommend optimizing calcium  (1200 mg/day) and vitamin D  (800 IU/day) intake.  Followup: Repeat BMD is appropriate after 2 years or after 1-2 years if starting treatment.   WHO criteria for diagnosis of osteoporosis in postmenopausal women and in men 23 y/o or older:  - normal: T-score -1.0 to + 1.0 - osteopenia/low bone density: T-score between -2.5 and -1.0 - osteoporosis: T-score below -2.5 - severe osteoporosis: T-score below -2.5 with history of fragility fracture Note: although not part of the WHO classification, the presence of a fragility fracture, regardless of the T-score, should be considered diagnostic of osteoporosis, provided other causes for the fracture have been excluded.   Treatment: The National Osteoporosis Foundation recommends that treatment be considered in postmenopausal women and men age 40 or older with: 1. Hip or vertebral (clinical or morphometric) fracture 2. T-score of - 2.5 or lower at the spine or hip 3. 10-year fracture probability by FRAX of at least 20% for a major osteoporotic fracture and 3% for a hip fracture  He does have prior history of fragility fractures and NO history of kidney stones.  She does have moderate CKD. Last BUN/Cr: 15/1.07 on 07/01/23  she is not on HCTZ or other thiazide therapy.  No history of  vitamin D  deficiency. Last vitamin D  level was 69 in 01/20/22.  she is on calcium  supplements (started a month ago),  she eats dairy and green, leafy, vegetables on average amounts.  she does not have a family history of hypercalcemia, pituitary tumors, thyroid  cancer, or osteoporosis.   I reviewed her chart and she also has a history of DM, HTN, HLD, obesity, NAFLD.    Review of systems  Constitutional: + Minimally fluctuating body weight,  current Body mass index is 29.85 kg/m. , no fatigue, no subjective hyperthermia, no subjective hypothermia Eyes: no blurry vision, no xerophthalmia ENT: no sore throat, no nodules palpated in throat, no  dysphagia/odynophagia, no hoarseness Cardiovascular: no chest pain, no shortness of breath, no palpitations, no leg swelling Respiratory: no cough, no shortness of breath Gastrointestinal: no nausea/vomiting/diarrhea Musculoskeletal: no muscle/joint aches Skin: no rashes, no hyperemia Neurological: no tremors, no numbness, no tingling, no dizziness Psychiatric: no depression, no anxiety    OBJECTIVE:  BP 132/80 (BP Location: Right Arm, Patient Position: Sitting, Cuff Size: Large)   Pulse 62   Ht 5' 7 (1.702 m)   Wt 190 lb 9.6 oz (86.5 kg)   BMI 29.85 kg/m , Body mass index is 29.85 kg/m. Wt Readings from Last 3 Encounters:  05/26/24 190 lb 9.6 oz (86.5 kg)  01/24/24 185 lb (83.9 kg)  10/07/23 185 lb 12.8 oz (84.3 kg)      Physical Exam- Limited  Constitutional:  Body mass index is 29.85 kg/m. , not in acute distress, normal state of mind Eyes:  EOMI, no exophthalmos Musculoskeletal: no gross deformities, strength intact in all four extremities, no gross restriction of joint movements Skin:  no rashes, no hyperemia Neurological: no tremor with outstretched hands   CMP ( most recent) CMP     Component Value Date/Time   NA 144 05/10/2024 1025   K 4.9 05/10/2024 1025   CL 107 (H) 05/10/2024 1025   CO2 19 (L) 05/10/2024 1025    GLUCOSE 88 05/10/2024 1025   GLUCOSE 172 (H) 01/08/2023 0811   BUN 16 05/10/2024 1025   CREATININE 1.00 05/10/2024 1025   CALCIUM  10.8 (H) 05/10/2024 1025   PROT 7.3 05/10/2024 1025   ALBUMIN 4.3 05/10/2024 1025   AST 21 05/10/2024 1025   ALT 28 05/10/2024 1025   ALKPHOS 88 05/10/2024 1025   BILITOT 0.3 05/10/2024 1025   GFR 97.62 09/23/2018 0950   EGFR 60 05/10/2024 1025   GFRNONAA 60 (L) 01/08/2023 0811     Diabetic Labs (most recent): Lab Results  Component Value Date   HGBA1C 7.0 (H) 04/15/2022     Lipid Panel ( most recent) Lipid Panel     Component Value Date/Time   CHOL 133 12/31/2023 0000   TRIG 137 12/31/2023 0000   HDL 44 12/31/2023 0000   LDLCALC 65 12/31/2023 0000      Lab Results  Component Value Date   TSH 1.55 07/01/2023   TSH 2.33 09/25/2019     Latest Reference Range & Units 05/10/24 10:25  Comprehensive metabolic panel with GFR  Rpt !  Sodium 134 - 144 mmol/L 144  Potassium 3.5 - 5.2 mmol/L 4.9  Chloride 96 - 106 mmol/L 107 (H)  CO2 20 - 29 mmol/L 19 (L)  Glucose 70 - 99 mg/dL 88  BUN 8 - 27 mg/dL 16  Creatinine 9.42 - 8.99 mg/dL 8.99  Calcium  8.7 - 10.3 mg/dL 89.1 (H)  BUN/Creatinine Ratio 12 - 28  16  eGFR >59 mL/min/1.73 60  Alkaline Phosphatase 44 - 121 IU/L 88  Albumin 3.8 - 4.8 g/dL 4.3  AST 0 - 40 IU/L 21  ALT 0 - 32 IU/L 28  Total Protein 6.0 - 8.5 g/dL 7.3  Total Bilirubin 0.0 - 1.2 mg/dL 0.3  Vitamin D , 25-Hydroxy 30.0 - 100.0 ng/mL 31.2  Globulin, Total 1.5 - 4.5 g/dL 3.0  PTH, Intact 15 - 65 pg/mL 88 (H)  PTH Interp  Comment  !: Data is abnormal (H): Data is abnormally high (L): Data is abnormally low Rpt: View report in Results Review for more information  Assessment: 1. Hypercalcemia / Hyperparathyroidism  Plan: Patient has had several  instances of elevated calcium , with the highest level being at 11.5 mg/dL. A corresponding intact PTH level was also high, at 94.   Repeat calcium  level is stable at 10.8 with  elevated PTH of 88 (slightly improved). The urine test to check for FHH (familial hypocalciuric hypercalcemia), a genetic trait that sometimes causes high calcium  levels, was normal, ruling that out as possible etiology.  -She has and continues to decline surgical intervention as means to treat her hyperparathyroidism.  If calcium  levels are elevated over 11.1, may need treatment with Sensipar.  Vitamin d  is on the low end of normal.  She is taking her daily OTC Vitamin D  supplement sometimes, she has not been as consistent with it recently.  I encouraged her to take 2000 units daily (2 pills).    I spent  12  minutes in the care of the patient today including review of labs from Thyroid  Function, CMP, and other relevant labs ; imaging/biopsy records (current and previous including abstractions from other facilities); face-to-face time discussing  her lab results and symptoms, medications doses, her options of short and long term treatment based on the latest standards of care / guidelines;   and documenting the encounter.  Katelyn [name]  participated in the discussions, expressed understanding, and voiced agreement with the above plans.  All questions were answered to her satisfaction. she is encouraged to contact clinic should she have any questions or concerns prior to her return visit.   FOLLOW UP PLAN: - Return in about 6 months (around 11/26/2024) for hyperparathyroidisim follow up with previsit labs.  Benton Rio, [facility] [address] [place], [place] [address] Phone: [phone] Fax: [phone]  05/26/2024, 8:25 AM

## 2024-06-01 DIAGNOSIS — E663 Overweight: Secondary | ICD-10-CM | POA: Diagnosis not present

## 2024-06-01 DIAGNOSIS — I10 Essential (primary) hypertension: Secondary | ICD-10-CM | POA: Diagnosis not present

## 2024-06-01 DIAGNOSIS — Z6829 Body mass index (BMI) 29.0-29.9, adult: Secondary | ICD-10-CM | POA: Diagnosis not present

## 2024-06-01 DIAGNOSIS — E1159 Type 2 diabetes mellitus with other circulatory complications: Secondary | ICD-10-CM | POA: Diagnosis not present

## 2024-06-01 DIAGNOSIS — N390 Urinary tract infection, site not specified: Secondary | ICD-10-CM | POA: Diagnosis not present

## 2024-07-05 NOTE — Progress Notes (Unsigned)
 GI Office Note    Referring Provider: Leonce Lucie PARAS, PA* Primary Care Physician:  Leonce Lucie PARAS DEVONNA Primary Gastroenterologist: Lamar HERO.Rourk, MD  Date:  07/06/2024  ID:  Katelyn [name], DOB 30-Mar-1951, MRN [id number]   Chief Complaint   Chief Complaint  Patient presents with   Follow-up    Follow up. No problems    History of Present Illness  Katelyn [name] is a 73 y.o. female with a history of diabetes, HLD, HTN, parathyroid  disorders, arthritis, and constipation presenting today with no complaints.   Colonoscopy February 2019: -4 mm polyp in the hepatic flexure s/p resection and ablation -Nonbleeding grade 2 internal hemorrhoids -Advise repeat colonoscopy in 5 years   OV 01/05/23. patient reports intermittent constipation for many years.  Takes Dulcolax only if no BM in few days.  At times does want a whole week without a bowel movement.  Usually with a bowel movement 1-2 times per week.  Denies any abdominal pain increased gassiness.  Positive straining.  Hemorrhoids not currently bothering her.  She did report a history of colon cancer in her father and brother.  She is scheduled for colonoscopy in the near future with Dr. Shaaron.  Advised MiraLAX  daily or every other day and daily fiber supplementation.   Colonoscopy 01/21/23: -Hemorrhoids on perianal exam -Nonbleeding internal hemorrhoids -Normal colon -Repeat colonoscopy in 5 years  Last office visit 05/10/23. No upper GI symptoms. Having issues with chronic UTIs. Using miralax  daily with good results and having daily BMs. No abdominal pain or alarm symptoms. Continue miralax , high fiber diet. Follow up in 1 year.    Today:  Discussed the use of AI scribe software for clinical note transcription with the patient, who gave verbal consent to proceed.  She experiences ongoing constipation, requiring the use of Miralax  approximately twice a week to facilitate bowel movements. She reports that she usually  needs to take Miralax  in order to have a bowel movement. Occasionally, she also uses Dulcolax, approximately once a week, to aid in bowel movements.  No abdominal pain, blood in stool, black stool, mucus, or rectal pain. She also denies heartburn, nausea, vomiting, or difficulty swallowing. Her bowel movements sometimes occur more than twice a week, but she often needs to take Miralax  to maintain regularity. After taking Miralax , she can have a bowel movement within five to ten minutes, which helps keep her regular for a couple of days.  She has not tried fiber supplements like Metamucil or Benefiber. She currently does not take any extra fiber supplements and relies on Miralax  and Dulcolax for bowel regulation.   Wt Readings from Last 5 Encounters:  07/06/24 190 lb 6.4 oz (86.4 kg)  05/26/24 190 lb 9.6 oz (86.5 kg)  01/24/24 185 lb (83.9 kg)  10/07/23 185 lb 12.8 oz (84.3 kg)  05/10/23 187 lb (84.8 kg)    Current Outpatient Medications  Medication Sig Dispense Refill   ACCU-CHEK AVIVA PLUS test strip 1 each daily.     amLODipine -benazepril  (LOTREL) 10-40 MG capsule Take 1 capsule by mouth daily. 90 capsule 3   aspirin  EC 81 MG tablet Take 1 tablet (81 mg total) by mouth daily with breakfast. 100 tablet 3   cholecalciferol (VITAMIN D ) 1000 units tablet Take 1,000 Units by mouth daily.     furosemide  (LASIX ) 20 MG tablet Take 1 tablet (20 mg total) by mouth daily. 90 tablet 2   glipiZIDE  (GLUCOTROL  XL) 10 MG 24 hr tablet Take 1 tablet (10  mg total) by mouth daily with breakfast. 90 tablet 3   JARDIANCE  25 MG TABS tablet Take 25 mg by mouth daily.     metFORMIN  (GLUCOPHAGE ) 1000 MG tablet Take 1 tablet (1,000 mg total) by mouth 2 (two) times daily. 60 tablet 5   metoprolol  succinate (TOPROL -XL) 50 MG 24 hr tablet Take 1 tablet (50 mg total) by mouth daily. 90 tablet 3   OMEGA 3 1000 MG CAPS Take 1,000 mg by mouth daily.     rosuvastatin  (CRESTOR ) 20 MG tablet Take 1 tablet (20 mg total) by  mouth daily. 90 tablet 3   No current facility-administered medications for this visit.    Past Medical History:  Diagnosis Date   Acute meniscal tear of knee    RIGHT   Arthritis    BACK   Diabetes mellitus, type 2 (HCC)    Hypercholesteremia    Hypertension    Parathyroid  disorder [facility])     Past Surgical History:  Procedure Laterality Date   CATARACT EXTRACTION Right    COLONOSCOPY     COLONOSCOPY N/A 12/01/2017   Procedure: COLONOSCOPY;  Surgeon: Shaaron Lamar HERO, MD;  [facility];  Service: Endoscopy;  Laterality: N/A;  10:45   COLONOSCOPY WITH PROPOFOL  N/A 01/21/2023   Procedure: COLONOSCOPY WITH PROPOFOL ;  Surgeon: Shaaron Lamar HERO, MD;  [facility];  Service: Endoscopy;  Laterality: N/A;  945am, asa 2   KNEE ARTHROSCOPY WITH LATERAL MENISECTOMY Right 02/16/2013   Procedure: RIGHT KNEE  ARTHROSCOPY WITH PARTIAL LATERAL MENISECTOMY; Excision of plica with shaving of patella,  partial medial menisectomy;  Surgeon: Lynwood SHAUNNA Bern, MD;  Location: [facility];  Service: Orthopedics;  Laterality: Right;   POLYPECTOMY  12/01/2017   Procedure: POLYPECTOMY;  Surgeon: Shaaron Lamar HERO, MD;  [facility];  Service: Endoscopy;;    Family History  Problem Relation Age of Onset   Stroke Mother    Colon cancer Father    Diabetes Father    Breast cancer Daughter 52   Colon cancer Brother    Hyperparathyroidism Neg Hx     Allergies as of 07/06/2024   (No Known Allergies)    Social History   Socioeconomic History   Marital status: Divorced    Spouse name: Not on file   Number of children: Not on file   Years of education: Not on file   Highest education level: Not on file  Occupational History   Not on file  Tobacco Use   Smoking status: Never    Passive exposure: Past   Smokeless tobacco: Never  Vaping Use   Vaping status: Never Used  Substance and Sexual Activity   Alcohol use: No   Drug use: No   Sexual activity:  Not on file  Other Topics Concern   Not on file  Social History Narrative   Not on file   Social Drivers of Health   Financial Resource Strain: Not on file  Food Insecurity: Not on file  Transportation Needs: Not on file  Physical Activity: Not on file  Stress: Not on file  Social Connections: Not on file     Review of Systems   Gen: Denies fever, chills, anorexia. Denies fatigue, weakness, weight loss.  CV: Denies chest pain, palpitations, syncope, peripheral edema, and claudication. Resp: Denies dyspnea at rest, cough, wheezing, coughing up blood, and pleurisy. GI: See HPI Derm: Denies rash, itching, dry skin Psych: Denies depression, anxiety, memory loss, confusion. No homicidal  or suicidal ideation.  Heme: Denies bruising, bleeding, and enlarged lymph nodes.  Physical Exam   BP 138/88 (BP Location: Right Arm, Patient Position: Sitting, Cuff Size: Normal)   Pulse 69   Temp 97.9 F (36.6 C) (Temporal)   Ht 5' 7 (1.702 m)   Wt 190 lb 6.4 oz (86.4 kg)   BMI 29.82 kg/m   General:   Alert and oriented. No distress noted. Pleasant and cooperative.  Head:  Normocephalic and atraumatic. Eyes:  Conjuctiva clear without scleral icterus. Abdomen:  +BS, soft, non-tender and non-distended. No rebound or guarding. No HSM or masses noted. Rectal: deferred Msk:  Symmetrical without gross deformities. Normal posture. Extremities:  Without edema. Neurologic:  Alert and  oriented x4 Psych:  Alert and cooperative. Normal mood and affect.  Assessment & Plan  TAMRE CASS is a 73 y.o. female presenting today with no complaints.     Chronic constipation Chronic constipation is well-managed with Miralax  approximately twice a week. She reports no abdominal pain, blood, black stool, mucus, rectal pain, straining, or prolonged time for bowel movements. Occasionally uses Dulcolax. No heartburn, nausea, vomiting, or dysphagia. - Recommend initiating fiber supplements such as  Metamucil or Benefiber, preferably taken at night to reduce gassiness. Provided options and images of fiber supplements for her consideration. - Continue MiraLAX  up to once daily as needed and Dulcolax as needed. - Advise follow-up as needed if symptoms worsen or new symptoms develop.      Follow up   Follow up as needed    Charmaine Melia, MSN, FNP-BC, AGACNP-BC [facility]

## 2024-07-06 ENCOUNTER — Ambulatory Visit: Admitting: Gastroenterology

## 2024-07-06 ENCOUNTER — Encounter: Payer: Self-pay | Admitting: Gastroenterology

## 2024-07-06 VITALS — BP 138/88 | HR 69 | Temp 97.9°F | Ht 67.0 in | Wt 190.4 lb

## 2024-07-06 DIAGNOSIS — K5909 Other constipation: Secondary | ICD-10-CM

## 2024-07-06 DIAGNOSIS — K5900 Constipation, unspecified: Secondary | ICD-10-CM

## 2024-07-06 NOTE — Patient Instructions (Addendum)
 In order to help keep you regular I recommend you start a fiber supplement like metamucil or benefiber.   Below are some brand name and store generic options - either powder or capsule form.  If you take the capsules you will take at least 2 once per day.  If you do the powder on to do 1 tablespoon in at least 8 ounces of water.      You may continue to use doses of MiraLAX  and or Dulcolax as needed to help with constipation.  If you have any worsening symptoms or development of new symptoms please feel free to contact us .  We will contact you with a triage survey when it is time for your next colonoscopy.  Follow-up as needed.  It was a pleasure to see you today. I want to create trusting relationships with patients. If you receive a survey regarding your visit,  I greatly appreciate you taking time to fill this out on paper or through your MyChart. I value your feedback.  Charmaine Melia, MSN, FNP-BC, AGACNP-BC [facility]

## 2024-09-04 ENCOUNTER — Ambulatory Visit (INDEPENDENT_AMBULATORY_CARE_PROVIDER_SITE_OTHER): Admitting: Physician Assistant

## 2024-09-04 ENCOUNTER — Encounter: Payer: Self-pay | Admitting: Physician Assistant

## 2024-09-04 VITALS — BP 148/87 | HR 53 | Temp 97.7°F | Ht 67.0 in | Wt 192.2 lb

## 2024-09-04 DIAGNOSIS — B351 Tinea unguium: Secondary | ICD-10-CM

## 2024-09-04 DIAGNOSIS — E1165 Type 2 diabetes mellitus with hyperglycemia: Secondary | ICD-10-CM | POA: Diagnosis not present

## 2024-09-04 DIAGNOSIS — Z7689 Persons encountering health services in other specified circumstances: Secondary | ICD-10-CM

## 2024-09-04 DIAGNOSIS — Z23 Encounter for immunization: Secondary | ICD-10-CM | POA: Diagnosis not present

## 2024-09-04 DIAGNOSIS — I10 Essential (primary) hypertension: Secondary | ICD-10-CM | POA: Diagnosis not present

## 2024-09-04 DIAGNOSIS — E782 Mixed hyperlipidemia: Secondary | ICD-10-CM | POA: Diagnosis not present

## 2024-09-04 DIAGNOSIS — Z7984 Long term (current) use of oral hypoglycemic drugs: Secondary | ICD-10-CM

## 2024-09-04 DIAGNOSIS — E559 Vitamin D deficiency, unspecified: Secondary | ICD-10-CM

## 2024-09-04 DIAGNOSIS — Z1231 Encounter for screening mammogram for malignant neoplasm of breast: Secondary | ICD-10-CM

## 2024-09-04 NOTE — Progress Notes (Signed)
 New Patient Office Visit  Subjective    Patient ID: Katelyn [name], female    DOB: 03-01-1951  Age: 73 y.o. MRN: [id number]  CC:  Chief Complaint  Patient presents with   New Patient (Initial Visit)    Patient is a new patient.     HPI Katelyn [name] presents to establish care  Discussed the use of AI scribe software for clinical note transcription with the patient, who gave verbal consent to proceed.  History of Present Illness Katelyn [name] is a 73 year old female who presents with concerns about a toenail issue.  She experiences significant pain from a toenail growing upwards, which interferes with wearing shoes. There are no other foot concerns. Her medical history includes diabetes, hypertension, hyperlipidemia, and a parathyroid  disorder. She takes Jardiance , glipizide , and metformin  for diabetes, with blood sugar levels occasionally ranging from 60 to 200. She is aware of hypoglycemic episodes, but states they occur infrequently. Her last blood work was in March, with an A1c in June. She regularly visits her eye doctor, with a recent finding of a worsening cataract but no diabetic retinopathy. Last visit was less than 1 months ago. She has not yet scheduled her annual mammogram due to a change in healthcare providers.   Outpatient Encounter Medications as of 09/04/2024  Medication Sig   ACCU-CHEK AVIVA PLUS test strip 1 each daily.   amLODipine -benazepril  (LOTREL) 10-40 MG capsule Take 1 capsule by mouth daily.   aspirin  EC 81 MG tablet Take 1 tablet (81 mg total) by mouth daily with breakfast.   cholecalciferol (VITAMIN D ) 1000 units tablet Take 1,000 Units by mouth daily.   furosemide  (LASIX ) 20 MG tablet Take 1 tablet (20 mg total) by mouth daily.   glipiZIDE  (GLUCOTROL  XL) 10 MG 24 hr tablet Take 1 tablet (10 mg total) by mouth daily with breakfast.   JARDIANCE  25 MG TABS tablet Take 25 mg by mouth daily.   metFORMIN  (GLUCOPHAGE ) 1000 MG tablet Take 1 tablet  (1,000 mg total) by mouth 2 (two) times daily.   metoprolol  succinate (TOPROL -XL) 50 MG 24 hr tablet Take 1 tablet (50 mg total) by mouth daily.   OMEGA 3 1000 MG CAPS Take 1,000 mg by mouth daily.   rosuvastatin  (CRESTOR ) 20 MG tablet Take 1 tablet (20 mg total) by mouth daily.   No facility-administered encounter medications on file as of 09/04/2024.    Past Medical History:  Diagnosis Date   Acute meniscal tear of knee    RIGHT   Arthritis    BACK   Diabetes mellitus, type 2 (HCC)    Hypercholesteremia    Hypertension    Parathyroid  disorder     Past Surgical History:  Procedure Laterality Date   CATARACT EXTRACTION Right    COLONOSCOPY     COLONOSCOPY N/A 12/01/2017   Procedure: COLONOSCOPY;  Surgeon: Shaaron Lamar HERO, MD;  [facility];  Service: Endoscopy;  Laterality: N/A;  10:45   COLONOSCOPY WITH PROPOFOL  N/A 01/21/2023   Procedure: COLONOSCOPY WITH PROPOFOL ;  Surgeon: Shaaron Lamar HERO, MD;  [facility];  Service: Endoscopy;  Laterality: N/A;  945am, asa 2   KNEE ARTHROSCOPY WITH LATERAL MENISECTOMY Right 02/16/2013   Procedure: RIGHT KNEE  ARTHROSCOPY WITH PARTIAL LATERAL MENISECTOMY; Excision of plica with shaving of patella,  partial medial menisectomy;  Surgeon: Lynwood SHAUNNA Bern, MD;  Location: [facility];  Service: Orthopedics;  Laterality: Right;   POLYPECTOMY  12/01/2017  Procedure: POLYPECTOMY;  Surgeon: Shaaron Lamar HERO, MD;  [facility];  Service: Endoscopy;;    Family History  Problem Relation Age of Onset   Stroke Mother    Colon cancer Father    Diabetes Father    Breast cancer Daughter 55   Colon cancer Brother    Hyperparathyroidism Neg Hx     Social History   Socioeconomic History   Marital status: Divorced    Spouse name: Not on file   Number of children: Not on file   Years of education: Not on file   Highest education level: Not on file  Occupational History   Not on file  Tobacco Use    Smoking status: Never    Passive exposure: Past   Smokeless tobacco: Never  Vaping Use   Vaping status: Never Used  Substance and Sexual Activity   Alcohol use: No   Drug use: No   Sexual activity: Not on file  Other Topics Concern   Not on file  Social History Narrative   Not on file   Social Drivers of Health   Financial Resource Strain: Not on file  Food Insecurity: Not on file  Transportation Needs: Not on file  Physical Activity: Not on file  Stress: Not on file  Social Connections: Not on file  Intimate Partner Violence: Not on file    Review of Systems  Constitutional:  Negative for chills, fever and malaise/fatigue.  Eyes:  Negative for blurred vision and double vision.  Respiratory:  Negative for cough and shortness of breath.   Cardiovascular:  Negative for chest pain and palpitations.  Neurological:  Negative for dizziness and headaches.        Objective    BP (!) 148/87   Pulse (!) 53   Temp 97.7 F (36.5 C)   Ht 5' 7 (1.702 m)   Wt 192 lb 4 oz (87.2 kg)   SpO2 94%   BMI 30.11 kg/m   Physical Exam Constitutional:      General: She is not in acute distress.    Appearance: Normal appearance. She is obese. She is not ill-appearing.  HENT:     Head: Normocephalic and atraumatic.     Mouth/Throat:     Mouth: Mucous membranes are moist.     Pharynx: Oropharynx is clear.  Eyes:     Extraocular Movements: Extraocular movements intact.     Conjunctiva/sclera: Conjunctivae normal.  Cardiovascular:     Rate and Rhythm: Normal rate and regular rhythm.     Heart sounds: Normal heart sounds. No murmur heard. Pulmonary:     Effort: Pulmonary effort is normal.     Breath sounds: Normal breath sounds.  Musculoskeletal:     Right lower leg: No edema.     Left lower leg: No edema.  Feet:     Right foot:     Skin integrity: Skin integrity normal.     Toenail Condition: Right toenails are abnormally thick and long. Fungal disease present.    Left foot:      Skin integrity: Skin integrity normal.     Toenail Condition: Left toenails are normal.  Skin:    General: Skin is warm and dry.  Neurological:     General: No focal deficit present.     Mental Status: She is alert and oriented to person, place, and time.  Psychiatric:        Mood and Affect: Mood normal.  Behavior: Behavior normal.        Assessment & Plan:  Encounter to establish care  Onychomycosis of right great toe Assessment & Plan: Chronic toenail fungus causing discomfort and difficulty wearing shoes due to abnormally thick right great toe nail with deranged growth pattern upwards.  - Referred to podiatry for toenail management.  Orders: -     Ambulatory referral to Podiatry  Primary hypertension Assessment & Plan: 148/87 Elevated above goal. Continue current medications. No change in management. Monitor BP at home and follow up in 1 month.  Discussed DASH diet and dietary sodium restrictions.  Increase dietary efforts and physical activity. Follow up sooner for new headache, visual changes, chest pain, shortness of breath.   Orders: -     Comprehensive metabolic panel with GFR -     CBC with Differential/Platelet -     Microalbumin / creatinine urine ratio  Type 2 diabetes mellitus with hyperglycemia, without long-term current use of insulin  (HCC) Assessment & Plan: Stable. Continue current management.  Patient is on aspirin , ACEI, statin. Labs today.  Increase dietary efforts and physical activity. Routine diabetic retinopathy screening: up-to-date. Foot exam and monofilament test done. Referral to podiatry placed.   Orders: -     Hemoglobin A1c -     Comprehensive metabolic panel with GFR -     CBC with Differential/Platelet -     Lipid panel -     Microalbumin / creatinine urine ratio  Mixed hyperlipidemia Assessment & Plan: Stable. Continue with current management without changes. Discussed healthy diet and lifestyle. Lipid panel  today.   Orders: -     Lipid panel  Vitamin D  deficiency -     VITAMIN D  25 Hydroxy (Vit-D Deficiency, Fractures)  Immunization due -     Flu vaccine HIGH DOSE PF(Fluzone Trivalent)  Encounter for screening mammogram for malignant neoplasm of breast -     3D Screening Mammogram, Left and Right    Return in about 4 weeks (around 10/02/2024) for HTN.   Charmaine Jayvion Stefanski, PA-C

## 2024-09-04 NOTE — Assessment & Plan Note (Signed)
 Chronic toenail fungus causing discomfort and difficulty wearing shoes due to abnormally thick right great toe nail with deranged growth pattern upwards.  - Referred to podiatry for toenail management.

## 2024-09-04 NOTE — Assessment & Plan Note (Signed)
 148/87 Elevated above goal. Continue current medications. No change in management. Monitor BP at home and follow up in 1 month.  Discussed DASH diet and dietary sodium restrictions.  Increase dietary efforts and physical activity. Follow up sooner for new headache, visual changes, chest pain, shortness of breath.

## 2024-09-04 NOTE — Assessment & Plan Note (Signed)
 Stable. Continue current management.  Patient is on aspirin , ACEI, statin. Labs today.  Increase dietary efforts and physical activity. Routine diabetic retinopathy screening: up-to-date. Foot exam and monofilament test done. Referral to podiatry placed.

## 2024-09-04 NOTE — Assessment & Plan Note (Signed)
 Stable. Continue with current management without changes. Discussed healthy diet and lifestyle. Lipid panel today.

## 2024-09-04 NOTE — Patient Instructions (Signed)
 Please call [facility]Bone density scheduling [phone]

## 2024-09-05 ENCOUNTER — Ambulatory Visit: Payer: Self-pay | Admitting: Physician Assistant

## 2024-09-05 DIAGNOSIS — E1165 Type 2 diabetes mellitus with hyperglycemia: Secondary | ICD-10-CM

## 2024-09-06 LAB — CBC WITH DIFFERENTIAL/PLATELET
Basophils Absolute: 0 x10E3/uL (ref 0.0–0.2)
Basos: 1 %
EOS (ABSOLUTE): 0.1 x10E3/uL (ref 0.0–0.4)
Eos: 2 %
Hematocrit: 45.6 % (ref 34.0–46.6)
Hemoglobin: 14.5 g/dL (ref 11.1–15.9)
Immature Grans (Abs): 0 x10E3/uL (ref 0.0–0.1)
Immature Granulocytes: 0 %
Lymphocytes Absolute: 2.6 x10E3/uL (ref 0.7–3.1)
Lymphs: 47 %
MCH: 28.2 pg (ref 26.6–33.0)
MCHC: 31.8 g/dL (ref 31.5–35.7)
MCV: 89 fL (ref 79–97)
Monocytes Absolute: 0.4 x10E3/uL (ref 0.1–0.9)
Monocytes: 8 %
Neutrophils Absolute: 2.3 x10E3/uL (ref 1.4–7.0)
Neutrophils: 42 %
Platelets: 226 x10E3/uL (ref 150–450)
RBC: 5.14 x10E6/uL (ref 3.77–5.28)
RDW: 13.1 % (ref 11.7–15.4)
WBC: 5.5 x10E3/uL (ref 3.4–10.8)

## 2024-09-06 LAB — VITAMIN D 25 HYDROXY (VIT D DEFICIENCY, FRACTURES): Vit D, 25-Hydroxy: 37.9 ng/mL (ref 30.0–100.0)

## 2024-09-06 LAB — LIPID PANEL
Chol/HDL Ratio: 2.9 ratio (ref 0.0–4.4)
Cholesterol, Total: 128 mg/dL (ref 100–199)
HDL: 44 mg/dL (ref >39)
LDL Chol Calc (NIH): 62 mg/dL (ref 0–99)
Triglycerides: 121 mg/dL (ref 0–149)
VLDL Cholesterol Cal: 22 mg/dL (ref 5–40)

## 2024-09-06 LAB — HEMOGLOBIN A1C
Est. average glucose Bld gHb Est-mCnc: 169 mg/dL
Hgb A1c MFr Bld: 7.5 % — ABNORMAL HIGH (ref 4.8–5.6)

## 2024-09-06 LAB — MICROALBUMIN / CREATININE URINE RATIO
Creatinine, Urine: 21.3 mg/dL
Microalb/Creat Ratio: 65 mg/g{creat} — AB (ref 0–29)
Microalbumin, Urine: 13.8 ug/mL

## 2024-09-07 ENCOUNTER — Other Ambulatory Visit: Payer: Self-pay | Admitting: Physician Assistant

## 2024-09-07 MED ORDER — METOPROLOL SUCCINATE ER 50 MG PO TB24: 50.0000 mg | ORAL_TABLET | Freq: Every day | ORAL | 3 refills | Status: AC

## 2024-09-07 MED ORDER — METFORMIN HCL 1000 MG PO TABS: 1000.0000 mg | ORAL_TABLET | Freq: Two times a day (BID) | ORAL | 5 refills | Status: AC

## 2024-09-07 MED ORDER — GLIPIZIDE ER 10 MG PO TB24: 10.0000 mg | ORAL_TABLET | Freq: Every day | ORAL | 3 refills | Status: DC

## 2024-09-07 MED ORDER — FUROSEMIDE 20 MG PO TABS: 20.0000 mg | ORAL_TABLET | Freq: Every day | ORAL | 2 refills | Status: AC

## 2024-09-07 MED ORDER — ROSUVASTATIN CALCIUM 20 MG PO TABS: 20.0000 mg | ORAL_TABLET | Freq: Every day | ORAL | 3 refills | Status: AC

## 2024-09-07 MED ORDER — AMLODIPINE BESY-BENAZEPRIL HCL 10-40 MG PO CAPS: 1.0000 | ORAL_CAPSULE | Freq: Every day | ORAL | 3 refills | Status: AC

## 2024-09-07 NOTE — Telephone Encounter (Signed)
 Copied from CRM #[id number]. Topic: Clinical - Medication Refill >> Sep 07, 2024 11:50 AM Cynthia K wrote: Medication:  amLODipine -benazepril  (LOTREL) 10-40 MG capsule furosemide  (LASIX ) 20 MG tablet glipiZIDE  (GLUCOTROL  XL) 10 MG 24 hr tablet metFORMIN  (GLUCOPHAGE ) 1000 MG tablet metoprolol  succinate (TOPROL -XL) 50 MG 24 hr tablet  rosuvastatin  (CRESTOR ) 20 MG tablet   Has the patient contacted their pharmacy? Yes (Agent: If no, request that the patient contact the pharmacy for the refill. If patient does not wish to contact the pharmacy document the reason why and proceed with request.) (Agent: If yes, when and what did the pharmacy advise?) Pharmacy needs order to refill  This is the patient's preferred pharmacy:  [facility] - [place], [place] - [address] [place] [address] Phone: [phone] Fax: [phone]  Is this the correct pharmacy for this prescription? Yes If no, delete pharmacy and type the correct one.   Has the prescription been filled recently? No  Is the patient out of the medication? No  Has the patient been seen for an appointment in the last year OR does the patient have an upcoming appointment? Yes  Can we respond through MyChart? Yes  Agent: Please be advised that Rx refills may take up to 3 business days. We ask that you follow-up with your pharmacy.

## 2024-09-08 MED ORDER — TIRZEPATIDE 7.5 MG/0.5ML ~~LOC~~ SOAJ: 7.5000 mg | SUBCUTANEOUS | 1 refills | Status: DC

## 2024-09-08 MED ORDER — TIRZEPATIDE 2.5 MG/0.5ML ~~LOC~~ SOAJ: 2.5000 mg | SUBCUTANEOUS | 0 refills | Status: DC

## 2024-09-08 MED ORDER — TIRZEPATIDE 5 MG/0.5ML ~~LOC~~ SOAJ: 5.0000 mg | SUBCUTANEOUS | 0 refills | Status: DC

## 2024-09-20 ENCOUNTER — Ambulatory Visit (HOSPITAL_COMMUNITY)
Admission: RE | Admit: 2024-09-20 | Discharge: 2024-09-20 | Disposition: A | Source: Ambulatory Visit | Attending: Physician Assistant

## 2024-09-20 DIAGNOSIS — Z1231 Encounter for screening mammogram for malignant neoplasm of breast: Secondary | ICD-10-CM | POA: Insufficient documentation

## 2024-10-02 ENCOUNTER — Encounter: Payer: Self-pay | Admitting: Physician Assistant

## 2024-10-02 ENCOUNTER — Ambulatory Visit: Admitting: Physician Assistant

## 2024-10-02 VITALS — BP 133/82 | HR 73 | Temp 98.2°F | Ht 67.0 in | Wt 189.2 lb

## 2024-10-02 DIAGNOSIS — I10 Essential (primary) hypertension: Secondary | ICD-10-CM | POA: Diagnosis not present

## 2024-10-02 DIAGNOSIS — Z7984 Long term (current) use of oral hypoglycemic drugs: Secondary | ICD-10-CM | POA: Diagnosis not present

## 2024-10-02 DIAGNOSIS — E1165 Type 2 diabetes mellitus with hyperglycemia: Secondary | ICD-10-CM

## 2024-10-02 NOTE — Patient Instructions (Signed)
 Stop taking glipizide .  Skip Metformin  if fasting sugar is less than 80.  Finish 1 month supply of once weekly 2.5 mg Mounjaro  injections.  Once out of current prescription go to pharmacy for increased 5 mg dose.

## 2024-10-02 NOTE — Assessment & Plan Note (Signed)
 Discussed current treatment plan. Discontinue glipizide  due to concern for hypoglycemia. Advised to continue Mounjaro  at increased dose of 5 mg. We discussed skipping Metformin  in the morning if fasting sugar is less than 80. Follow up in 1 month to re-evaluate blood sugar readings.

## 2024-10-02 NOTE — Progress Notes (Signed)
 Established Patient Office Visit  Subjective   Patient ID: Katelyn [name], female    DOB: 10-25-1950  Age: 73 y.o. MRN: [id number]  Chief Complaint  Patient presents with   Follow-up    Patient is here for a follow up on BP.  Patient stated when she takes mounjaro , metformin , and jardiance  her sugar drops    Hypertension, follow-up  BP Readings from Last 3 Encounters:  10/02/24 133/82  09/04/24 (!) 148/87  07/06/24 138/88   Wt Readings from Last 3 Encounters:  10/02/24 189 lb 4 oz (85.8 kg)  09/04/24 192 lb 4 oz (87.2 kg)  07/06/24 190 lb 6.4 oz (86.4 kg)     She was last seen for hypertension 4 weeks ago.  BP at that visit was 148/87. Management since that visit includes continued medication compliance.  She reports good compliance with treatment. She is not having side effects.  She is following a Diabetic diet. She is not exercising. She does not smoke.  Use of agents associated with hypertension: none.   Symptoms: No chest pain No chest pressure  No palpitations No syncope  No dyspnea No orthopnea  No paroxysmal nocturnal dyspnea No lower extremity edema   Pertinent labs Lab Results  Component Value Date   CHOL 128 09/04/2024   HDL 44 09/04/2024   LDLCALC 62 09/04/2024   TRIG 121 09/04/2024   CHOLHDL 2.9 09/04/2024   Lab Results  Component Value Date   NA 144 05/10/2024   K 4.9 05/10/2024   CREATININE 1.00 05/10/2024   EGFR 60 05/10/2024   GLUCOSE 88 05/10/2024   TSH 1.55 07/01/2023     The ASCVD Risk score (Arnett DK, et al., 2019) failed to calculate for the following reasons:   The valid total cholesterol range is 130 to 320 mg/dL  Additionally, she reports episodes of hypoglycemia since starting Mounjaro . She reports 1 episode of blood sugar reading of 66, other reported lows around 80. Additional treatment includes metformin , Jardiance , and glipizide . She has completed her first month of Mounjaro  and requests refill.    Review of Systems   Constitutional:  Negative for fatigue and fever.  Eyes:  Negative for visual disturbance.  Respiratory:  Negative for cough and shortness of breath.   Cardiovascular:  Negative for chest pain.  Neurological:  Negative for light-headedness and headaches.      Objective:     BP 133/82   Pulse 73   Temp 98.2 F (36.8 C)   Ht 5' 7 (1.702 m)   Wt 189 lb 4 oz (85.8 kg)   SpO2 97%   BMI 29.64 kg/m    Physical Exam Constitutional:      General: She is not in acute distress.    Appearance: Normal appearance. She is obese. She is not ill-appearing.  HENT:     Head: Normocephalic and atraumatic.     Mouth/Throat:     Mouth: Mucous membranes are moist.     Pharynx: Oropharynx is clear.  Eyes:     Extraocular Movements: Extraocular movements intact.     Conjunctiva/sclera: Conjunctivae normal.  Cardiovascular:     Rate and Rhythm: Normal rate and regular rhythm.     Heart sounds: Normal heart sounds. No murmur heard. Pulmonary:     Effort: Pulmonary effort is normal.     Breath sounds: Normal breath sounds.  Musculoskeletal:     Right lower leg: No edema.     Left lower leg: No edema.  Skin:  General: Skin is warm and dry.  Neurological:     General: No focal deficit present.     Mental Status: She is alert and oriented to person, place, and time.  Psychiatric:        Mood and Affect: Mood normal.        Behavior: Behavior normal.     No results found for any visits on 10/02/24.  The ASCVD Risk score (Arnett DK, et al., 2019) failed to calculate for the following reasons:   The valid total cholesterol range is 130 to 320 mg/dL    Assessment & Plan:   Return in about 4 weeks (around 10/30/2024) for Blood sugars.   Primary hypertension Assessment & Plan: 145/85, 133/82 Stable. Continue current medications. No change in management. Discussed DASH diet and dietary sodium restrictions.  Increase dietary efforts and physical activity.    Type 2 diabetes  mellitus with hyperglycemia, without long-term current use of insulin  [facility] Assessment & Plan: Discussed current treatment plan. Discontinue glipizide  due to concern for hypoglycemia. Advised to continue Mounjaro  at increased dose of 5 mg. We discussed skipping Metformin  in the morning if fasting sugar is less than 80. Follow up in 1 month to re-evaluate blood sugar readings.      Charmaine Barbaraann Avans, PA-C

## 2024-10-02 NOTE — Assessment & Plan Note (Signed)
 145/85, 133/82 Stable. Continue current medications. No change in management. Discussed DASH diet and dietary sodium restrictions.  Increase dietary efforts and physical activity.

## 2024-10-04 ENCOUNTER — Encounter: Payer: Self-pay | Admitting: Podiatry

## 2024-10-04 ENCOUNTER — Ambulatory Visit: Admitting: Podiatry

## 2024-10-04 VITALS — Ht 67.0 in | Wt 189.2 lb

## 2024-10-04 DIAGNOSIS — L600 Ingrowing nail: Secondary | ICD-10-CM | POA: Diagnosis not present

## 2024-10-04 NOTE — Patient Instructions (Signed)

## 2024-10-04 NOTE — Progress Notes (Signed)
 Chief Complaint  Patient presents with   Nail Problem    Pt is here due to right great toenail, nail is hard and thick and causing pain, she states that she has been using oragel to help with the pain, she is not sure if the nail needs to come off.    HPI: 73 y.o. female presenting today for evaluation of a painful right great toenail.  She says that it is very painful in her shoes and she is unable to trim the nail.  It has been thick and dystrophic for several years according to the patient  Past Medical History:  Diagnosis Date   Acute meniscal tear of knee    RIGHT   Arthritis    BACK   Diabetes mellitus, type 2 (HCC)    Hypercholesteremia    Hypertension    Parathyroid  disorder     Past Surgical History:  Procedure Laterality Date   CATARACT EXTRACTION Right    COLONOSCOPY     COLONOSCOPY N/A 12/01/2017   Procedure: COLONOSCOPY;  Surgeon: Shaaron Lamar HERO, MD;  [facility];  Service: Endoscopy;  Laterality: N/A;  10:45   COLONOSCOPY WITH PROPOFOL  N/A 01/21/2023   Procedure: COLONOSCOPY WITH PROPOFOL ;  Surgeon: Shaaron Lamar HERO, MD;  Location: [facility];  Service: Endoscopy;  Laterality: N/A;  945am, asa 2   KNEE ARTHROSCOPY WITH LATERAL MENISECTOMY Right 02/16/2013   Procedure: RIGHT KNEE  ARTHROSCOPY WITH PARTIAL LATERAL MENISECTOMY; Excision of plica with shaving of patella,  partial medial menisectomy;  Surgeon: Lynwood SHAUNNA Bern, MD;  Location: [facility];  Service: Orthopedics;  Laterality: Right;   POLYPECTOMY  12/01/2017   Procedure: POLYPECTOMY;  Surgeon: Shaaron Lamar HERO, MD;  Location: [facility];  Service: Endoscopy;;    Allergies[1]   Physical Exam: General: The patient is alert and oriented x3 in no acute distress.  Dermatology: Skin is warm, dry and supple bilateral lower extremities.  Significantly painful hyperkeratotic dystrophic nail noted to the right hallux nail plate.  The entire nail plate is dystrophic with thickened  borders around the periungual regions  Vascular: Palpable pedal pulses bilaterally. Capillary refill within normal limits.  No appreciable edema.  No erythema.  Neurological: Grossly intact via light touch  Musculoskeletal Exam: No pedal deformities noted   Assessment/Plan of Care: 1.  Painful dystrophic toenail right hallux nail plate  -Patient evaluated -Due to the increased pain associated to the toenail as well as the severity of the nail dystrophy, decision was made today for total temporary nail avulsion of the hallux nail plate.  This was discussed with the patient and she is amenable to this plan. -The toe was prepped in aseptic manner and digital block performed using 3 mL of 2% lidocaine  plain -The nail was avulsed in its entirety and dressings applied.  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 Thresa EMERSON Sar, DPM    [address] [place], [place] [address]                Office [phone]  Fax [phone]        [  1] No Known Allergies

## 2024-10-25 ENCOUNTER — Encounter: Payer: Self-pay | Admitting: Podiatry

## 2024-10-25 ENCOUNTER — Ambulatory Visit: Admitting: Podiatry

## 2024-10-25 VITALS — Ht 67.0 in | Wt 189.2 lb

## 2024-10-25 DIAGNOSIS — L600 Ingrowing nail: Secondary | ICD-10-CM

## 2024-10-25 NOTE — Progress Notes (Signed)
" ° °  Chief Complaint  Patient presents with   Nail Problem    Pt is here to f/u on right great toenail after having it removed, states no complaints.    Subjective: 74 y.o. female presents today status post total temporary nail avulsion of the right hallux performed on 10/04/2024.  Patient doing well.  She no longer has any pain or tenderness Past Medical History:  Diagnosis Date   Acute meniscal tear of knee    RIGHT   Arthritis    BACK   Diabetes mellitus, type 2 (HCC)    Hypercholesteremia    Hypertension    Parathyroid  disorder     Objective: Neurovascular status intact.  Skin is warm, dry and supple. Nail bed and respective nail fold appears to be healing appropriately.   Assessment: #1 s/p total temporary nail avulsion right hallux nail plate.  10/04/2024   Plan of care: -Patient was evaluated  -Light debridement of the periungual debris was performed to the border of the respective toe and nail plate using a tissue nipper. -Recommend triple antibiotic and a Band-Aid daily until it resolves completely -Patient is to return to clinic on a PRN basis.   Thresa EMERSON Sar, DPM [facility]  Dr. Thresa EMERSON Sar, DPM    [address] [place], [place] [address]                Office [phone]  Fax [phone]     "

## 2024-10-30 ENCOUNTER — Ambulatory Visit: Admitting: Physician Assistant

## 2024-10-30 ENCOUNTER — Encounter: Payer: Self-pay | Admitting: Physician Assistant

## 2024-10-30 VITALS — BP 140/80 | HR 76 | Temp 97.3°F | Ht 67.0 in | Wt 185.8 lb

## 2024-10-30 DIAGNOSIS — I10 Essential (primary) hypertension: Secondary | ICD-10-CM

## 2024-10-30 DIAGNOSIS — Z7985 Long-term (current) use of injectable non-insulin antidiabetic drugs: Secondary | ICD-10-CM | POA: Diagnosis not present

## 2024-10-30 DIAGNOSIS — Z7984 Long term (current) use of oral hypoglycemic drugs: Secondary | ICD-10-CM

## 2024-10-30 DIAGNOSIS — E1165 Type 2 diabetes mellitus with hyperglycemia: Secondary | ICD-10-CM | POA: Diagnosis not present

## 2024-10-30 MED ORDER — JARDIANCE 25 MG PO TABS: 25.0000 mg | ORAL_TABLET | Freq: Every day | ORAL | 1 refills | Status: AC

## 2024-10-30 MED ORDER — TIRZEPATIDE 7.5 MG/0.5ML ~~LOC~~ SOAJ: 7.5000 mg | SUBCUTANEOUS | 1 refills | Status: AC

## 2024-10-30 MED ORDER — HYDROCHLOROTHIAZIDE 12.5 MG PO TABS: 12.5000 mg | ORAL_TABLET | Freq: Every day | ORAL | 3 refills | Status: AC

## 2024-10-30 NOTE — Assessment & Plan Note (Signed)
 Blood pressure slightly elevated, home readings variable. Denies chest pain, shortness of breath, headache, or visual changes. Plan to add hydrochlorothiazide . - Start hydrochlorothiazide  12.5 mg daily. - Provided paper for tracking blood pressure readings. - Scheduled follow-up in one month. - Ordered lab work in 7-10 days for electrolytes.

## 2024-10-30 NOTE — Assessment & Plan Note (Signed)
 Blood sugars controlled, no hypoglycemia, Mounjaro  dose increased, no adverse effects. - Refilled Mounjaro  at 7.5 mg dose. - Refilled Jardiance . - Continue metformin . - Follow up in 4 months for repeat A1c testing, or sooner for concerns of hypoglycemia or if glucose readings are consistently above 250.

## 2024-10-30 NOTE — Progress Notes (Signed)
 "  Established Patient Office Visit  Subjective   Patient ID: Katelyn [name], female    DOB: 1951/03/19  Age: 74 y.o. MRN: [id number]  Chief Complaint  Patient presents with   Blood Sugar Problem    Follow up appt for bood sugar PT states mounjaro  is doing good and it hasn't dropped     Discussed the use of AI scribe software for clinical note transcription with the patient, who gave verbal consent to proceed.  History of Present Illness Katelyn [name] is a 74 year old female with diabetes who presents for follow-up on her blood sugar management.  Her home blood sugars have been stable, generally in the 90-124 range, with no recent hypoglycemia. She takes Mounjaro  and metformin  for diabetes. She previously used Jardiance  but stopped after running out of refills. Mounjaro  was started at 2.5 mg and increased to 5 mg. She has not yet advanced to 7.5 mg.  Her home blood pressure readings have been variable, with some around 134/62 or higher. She did not take her blood pressure medication today. She denies headaches, chest pain, shortness of breath, or vision changes.   Review of Systems  Constitutional:  Negative for activity change, appetite change, fatigue and fever.  Eyes:  Negative for visual disturbance.  Respiratory:  Negative for cough and shortness of breath.   Cardiovascular:  Negative for chest pain.  Neurological:  Negative for dizziness, light-headedness and headaches.       Objective:     BP (!) 140/80 (BP Location: Right Arm, Cuff Size: Normal)   Pulse 76   Temp (!) 97.3 F (36.3 C)   Ht 5' 7 (1.702 m)   Wt 185 lb 12.8 oz (84.3 kg)   SpO2 97%   BMI 29.10 kg/m    Physical Exam Constitutional:      General: She is not in acute distress.    Appearance: Normal appearance. She is obese. She is not ill-appearing.  HENT:     Head: Normocephalic and atraumatic.     Mouth/Throat:     Mouth: Mucous membranes are moist.     Pharynx: Oropharynx is clear.   Eyes:     Extraocular Movements: Extraocular movements intact.     Conjunctiva/sclera: Conjunctivae normal.  Cardiovascular:     Rate and Rhythm: Normal rate and regular rhythm.     Heart sounds: Normal heart sounds. No murmur heard. Pulmonary:     Effort: Pulmonary effort is normal.     Breath sounds: Normal breath sounds. No wheezing, rhonchi or rales.  Musculoskeletal:     Right lower leg: No edema.     Left lower leg: No edema.  Skin:    General: Skin is warm and dry.  Neurological:     General: No focal deficit present.     Mental Status: She is alert and oriented to person, place, and time.  Psychiatric:        Mood and Affect: Mood normal.        Behavior: Behavior normal.     No results found for any visits on 10/30/24.  The ASCVD Risk score (Arnett DK, et al., 2019) failed to calculate for the following reasons:   The valid total cholesterol range is 130 to 320 mg/dL    Assessment & Plan:   Return in about 4 weeks (around 11/27/2024) for BP.   Primary hypertension Assessment & Plan: Blood pressure slightly elevated, home readings variable. Denies chest pain, shortness of breath, headache, or visual  changes. Plan to add hydrochlorothiazide . - Start hydrochlorothiazide  12.5 mg daily. - Provided paper for tracking blood pressure readings. - Scheduled follow-up in one month. - Ordered lab work in 7-10 days for electrolytes.  Orders: -     hydroCHLOROthiazide ; Take 1 tablet (12.5 mg total) by mouth daily.  Dispense: 90 tablet; Refill: 3 -     Basic metabolic panel with GFR  Type 2 diabetes mellitus with hyperglycemia, without long-term current use of insulin  (HCC) Assessment & Plan: Blood sugars controlled, no hypoglycemia, Mounjaro  dose increased, no adverse effects. - Refilled Mounjaro  at 7.5 mg dose. - Refilled Jardiance . - Continue metformin . - Follow up in 4 months for repeat A1c testing, or sooner for concerns of hypoglycemia or if glucose readings are  consistently above 250.   Orders: -     Jardiance ; Take 1 tablet (25 mg total) by mouth daily.  Dispense: 90 tablet; Refill: 1 -     Tirzepatide ; Inject 7.5 mg into the skin once a week.  Dispense: 6 mL; Refill: 1   Sharronda Schweers, PA-C "

## 2024-11-23 LAB — COMPREHENSIVE METABOLIC PANEL WITH GFR
ALT: 23 [IU]/L (ref 0–32)
AST: 20 [IU]/L (ref 0–40)
Albumin: 4.4 g/dL (ref 3.8–4.8)
Alkaline Phosphatase: 80 [IU]/L (ref 49–135)
BUN/Creatinine Ratio: 19 (ref 12–28)
BUN: 26 mg/dL (ref 8–27)
Bilirubin Total: 0.5 mg/dL (ref 0.0–1.2)
CO2: 21 mmol/L (ref 20–29)
Calcium: 11.9 mg/dL — ABNORMAL HIGH (ref 8.7–10.3)
Chloride: 101 mmol/L (ref 96–106)
Creatinine, Ser: 1.39 mg/dL — ABNORMAL HIGH (ref 0.57–1.00)
Globulin, Total: 2.9 g/dL (ref 1.5–4.5)
Glucose: 101 mg/dL — ABNORMAL HIGH (ref 70–99)
Potassium: 3.8 mmol/L (ref 3.5–5.2)
Sodium: 141 mmol/L (ref 134–144)
Total Protein: 7.3 g/dL (ref 6.0–8.5)
eGFR: 40 mL/min/{1.73_m2} — ABNORMAL LOW

## 2024-11-23 LAB — PTH, INTACT AND CALCIUM: PTH: 50 pg/mL (ref 15–65)

## 2024-11-23 LAB — VITAMIN D 25 HYDROXY (VIT D DEFICIENCY, FRACTURES): Vit D, 25-Hydroxy: 30.7 ng/mL (ref 30.0–100.0)

## 2024-11-27 ENCOUNTER — Ambulatory Visit: Admitting: Nurse Practitioner

## 2024-11-27 DIAGNOSIS — E213 Hyperparathyroidism, unspecified: Secondary | ICD-10-CM

## 2024-11-28 ENCOUNTER — Ambulatory Visit: Admitting: Physician Assistant
